# Patient Record
Sex: Male | Born: 1970 | Race: White | Hispanic: No | Marital: Married | Smoking: Current every day smoker
Health system: Southern US, Community
[De-identification: ages and names within clinical notes are randomized; demographics above are authoritative.]

## PROBLEM LIST (undated history)

## (undated) DIAGNOSIS — E1169 Type 2 diabetes mellitus with other specified complication: Secondary | ICD-10-CM

## (undated) DIAGNOSIS — I10 Essential (primary) hypertension: Secondary | ICD-10-CM

## (undated) DIAGNOSIS — E785 Hyperlipidemia, unspecified: Secondary | ICD-10-CM

## (undated) DIAGNOSIS — K429 Umbilical hernia without obstruction or gangrene: Secondary | ICD-10-CM

## (undated) DIAGNOSIS — R519 Headache, unspecified: Secondary | ICD-10-CM

## (undated) DIAGNOSIS — G8929 Other chronic pain: Secondary | ICD-10-CM

## (undated) DIAGNOSIS — I2510 Atherosclerotic heart disease of native coronary artery without angina pectoris: Secondary | ICD-10-CM

## (undated) DIAGNOSIS — R51 Headache: Secondary | ICD-10-CM

## (undated) DIAGNOSIS — Z720 Tobacco use: Secondary | ICD-10-CM

## (undated) DIAGNOSIS — N049 Nephrotic syndrome with unspecified morphologic changes: Secondary | ICD-10-CM

## (undated) DIAGNOSIS — L0390 Cellulitis, unspecified: Secondary | ICD-10-CM

## (undated) DIAGNOSIS — N289 Disorder of kidney and ureter, unspecified: Secondary | ICD-10-CM

## (undated) HISTORY — PX: CORONARY STENT PLACEMENT: SHX1402

---

## 2001-03-17 ENCOUNTER — Encounter: Payer: Self-pay | Admitting: *Deleted

## 2001-03-17 ENCOUNTER — Emergency Department (HOSPITAL_COMMUNITY): Admission: EM | Admit: 2001-03-17 | Discharge: 2001-03-17 | Payer: Self-pay | Admitting: *Deleted

## 2002-09-14 ENCOUNTER — Emergency Department (HOSPITAL_COMMUNITY): Admission: EM | Admit: 2002-09-14 | Discharge: 2002-09-15 | Payer: Self-pay | Admitting: *Deleted

## 2002-09-15 ENCOUNTER — Encounter: Payer: Self-pay | Admitting: *Deleted

## 2002-09-15 ENCOUNTER — Encounter: Payer: Self-pay | Admitting: Family Medicine

## 2002-09-19 ENCOUNTER — Ambulatory Visit (HOSPITAL_COMMUNITY): Admission: RE | Admit: 2002-09-19 | Discharge: 2002-09-19 | Payer: Self-pay | Admitting: Urology

## 2002-09-19 ENCOUNTER — Encounter: Payer: Self-pay | Admitting: Urology

## 2003-03-15 ENCOUNTER — Emergency Department (HOSPITAL_COMMUNITY): Admission: EM | Admit: 2003-03-15 | Discharge: 2003-03-15 | Payer: Self-pay | Admitting: Emergency Medicine

## 2003-04-11 ENCOUNTER — Emergency Department (HOSPITAL_COMMUNITY): Admission: EM | Admit: 2003-04-11 | Discharge: 2003-04-11 | Payer: Self-pay | Admitting: *Deleted

## 2003-04-11 ENCOUNTER — Encounter: Payer: Self-pay | Admitting: *Deleted

## 2003-05-13 ENCOUNTER — Emergency Department (HOSPITAL_COMMUNITY): Admission: EM | Admit: 2003-05-13 | Discharge: 2003-05-13 | Payer: Self-pay | Admitting: *Deleted

## 2003-05-13 ENCOUNTER — Encounter: Payer: Self-pay | Admitting: *Deleted

## 2004-01-13 ENCOUNTER — Encounter (HOSPITAL_COMMUNITY): Admission: AD | Admit: 2004-01-13 | Discharge: 2004-02-12 | Payer: Self-pay | Admitting: Internal Medicine

## 2004-04-26 ENCOUNTER — Emergency Department (HOSPITAL_COMMUNITY): Admission: EM | Admit: 2004-04-26 | Discharge: 2004-04-26 | Payer: Self-pay | Admitting: Emergency Medicine

## 2004-04-29 ENCOUNTER — Emergency Department (HOSPITAL_COMMUNITY): Admission: EM | Admit: 2004-04-29 | Discharge: 2004-04-29 | Payer: Self-pay | Admitting: Emergency Medicine

## 2004-05-31 ENCOUNTER — Emergency Department (HOSPITAL_COMMUNITY): Admission: EM | Admit: 2004-05-31 | Discharge: 2004-05-31 | Payer: Self-pay | Admitting: Emergency Medicine

## 2004-06-20 ENCOUNTER — Emergency Department (HOSPITAL_COMMUNITY): Admission: EM | Admit: 2004-06-20 | Discharge: 2004-06-20 | Payer: Self-pay | Admitting: Emergency Medicine

## 2004-07-12 ENCOUNTER — Emergency Department (HOSPITAL_COMMUNITY): Admission: EM | Admit: 2004-07-12 | Discharge: 2004-07-12 | Payer: Self-pay | Admitting: Emergency Medicine

## 2004-07-16 ENCOUNTER — Emergency Department (HOSPITAL_COMMUNITY): Admission: EM | Admit: 2004-07-16 | Discharge: 2004-07-16 | Payer: Self-pay | Admitting: *Deleted

## 2004-07-17 ENCOUNTER — Emergency Department (HOSPITAL_COMMUNITY): Admission: EM | Admit: 2004-07-17 | Discharge: 2004-07-17 | Payer: Self-pay | Admitting: Emergency Medicine

## 2004-08-05 ENCOUNTER — Emergency Department (HOSPITAL_COMMUNITY): Admission: EM | Admit: 2004-08-05 | Discharge: 2004-08-05 | Payer: Self-pay | Admitting: Emergency Medicine

## 2004-08-13 ENCOUNTER — Emergency Department (HOSPITAL_COMMUNITY): Admission: EM | Admit: 2004-08-13 | Discharge: 2004-08-13 | Payer: Self-pay | Admitting: Emergency Medicine

## 2004-11-19 ENCOUNTER — Emergency Department (HOSPITAL_COMMUNITY): Admission: EM | Admit: 2004-11-19 | Discharge: 2004-11-19 | Payer: Self-pay | Admitting: Emergency Medicine

## 2004-11-26 ENCOUNTER — Emergency Department (HOSPITAL_COMMUNITY): Admission: EM | Admit: 2004-11-26 | Discharge: 2004-11-26 | Payer: Self-pay | Admitting: Emergency Medicine

## 2004-12-26 ENCOUNTER — Emergency Department (HOSPITAL_COMMUNITY): Admission: EM | Admit: 2004-12-26 | Discharge: 2004-12-26 | Payer: Self-pay | Admitting: Emergency Medicine

## 2004-12-30 ENCOUNTER — Emergency Department (HOSPITAL_COMMUNITY): Admission: EM | Admit: 2004-12-30 | Discharge: 2004-12-30 | Payer: Self-pay | Admitting: Emergency Medicine

## 2005-01-22 ENCOUNTER — Emergency Department (HOSPITAL_COMMUNITY): Admission: EM | Admit: 2005-01-22 | Discharge: 2005-01-22 | Payer: Self-pay | Admitting: Emergency Medicine

## 2005-02-05 ENCOUNTER — Emergency Department (HOSPITAL_COMMUNITY): Admission: EM | Admit: 2005-02-05 | Discharge: 2005-02-05 | Payer: Self-pay | Admitting: Emergency Medicine

## 2005-02-10 ENCOUNTER — Emergency Department (HOSPITAL_COMMUNITY): Admission: EM | Admit: 2005-02-10 | Discharge: 2005-02-10 | Payer: Self-pay | Admitting: Emergency Medicine

## 2005-02-18 ENCOUNTER — Emergency Department (HOSPITAL_COMMUNITY): Admission: EM | Admit: 2005-02-18 | Discharge: 2005-02-18 | Payer: Self-pay | Admitting: Emergency Medicine

## 2005-03-04 ENCOUNTER — Emergency Department (HOSPITAL_COMMUNITY): Admission: EM | Admit: 2005-03-04 | Discharge: 2005-03-04 | Payer: Self-pay | Admitting: Emergency Medicine

## 2005-03-18 ENCOUNTER — Encounter: Payer: Self-pay | Admitting: Orthopedic Surgery

## 2005-03-18 ENCOUNTER — Emergency Department (HOSPITAL_COMMUNITY): Admission: EM | Admit: 2005-03-18 | Discharge: 2005-03-18 | Payer: Self-pay | Admitting: *Deleted

## 2005-04-25 ENCOUNTER — Emergency Department (HOSPITAL_COMMUNITY): Admission: EM | Admit: 2005-04-25 | Discharge: 2005-04-25 | Payer: Self-pay | Admitting: Emergency Medicine

## 2005-04-28 ENCOUNTER — Emergency Department (HOSPITAL_COMMUNITY): Admission: EM | Admit: 2005-04-28 | Discharge: 2005-04-28 | Payer: Self-pay | Admitting: Emergency Medicine

## 2006-02-02 ENCOUNTER — Emergency Department (HOSPITAL_COMMUNITY): Admission: EM | Admit: 2006-02-02 | Discharge: 2006-02-02 | Payer: Self-pay | Admitting: Emergency Medicine

## 2006-02-17 ENCOUNTER — Emergency Department (HOSPITAL_COMMUNITY): Admission: EM | Admit: 2006-02-17 | Discharge: 2006-02-17 | Payer: Self-pay | Admitting: Emergency Medicine

## 2006-05-03 ENCOUNTER — Emergency Department (HOSPITAL_COMMUNITY): Admission: EM | Admit: 2006-05-03 | Discharge: 2006-05-03 | Payer: Self-pay | Admitting: Emergency Medicine

## 2007-02-08 ENCOUNTER — Emergency Department (HOSPITAL_COMMUNITY): Admission: EM | Admit: 2007-02-08 | Discharge: 2007-02-08 | Payer: Self-pay | Admitting: Emergency Medicine

## 2007-07-02 ENCOUNTER — Emergency Department (HOSPITAL_COMMUNITY): Admission: EM | Admit: 2007-07-02 | Discharge: 2007-07-02 | Payer: Self-pay | Admitting: Emergency Medicine

## 2007-07-15 ENCOUNTER — Emergency Department (HOSPITAL_COMMUNITY): Admission: EM | Admit: 2007-07-15 | Discharge: 2007-07-15 | Payer: Self-pay | Admitting: Emergency Medicine

## 2008-09-06 ENCOUNTER — Emergency Department (HOSPITAL_COMMUNITY): Admission: EM | Admit: 2008-09-06 | Discharge: 2008-09-06 | Payer: Self-pay | Admitting: Emergency Medicine

## 2008-10-10 ENCOUNTER — Emergency Department (HOSPITAL_COMMUNITY): Admission: EM | Admit: 2008-10-10 | Discharge: 2008-10-10 | Payer: Self-pay | Admitting: Emergency Medicine

## 2008-10-13 ENCOUNTER — Emergency Department (HOSPITAL_COMMUNITY): Admission: EM | Admit: 2008-10-13 | Discharge: 2008-10-13 | Payer: Self-pay | Admitting: Emergency Medicine

## 2008-10-28 ENCOUNTER — Encounter: Payer: Self-pay | Admitting: Orthopedic Surgery

## 2008-10-30 ENCOUNTER — Ambulatory Visit: Payer: Self-pay | Admitting: Orthopedic Surgery

## 2008-10-30 DIAGNOSIS — M549 Dorsalgia, unspecified: Secondary | ICD-10-CM | POA: Insufficient documentation

## 2008-10-30 DIAGNOSIS — M5430 Sciatica, unspecified side: Secondary | ICD-10-CM | POA: Insufficient documentation

## 2008-11-06 ENCOUNTER — Emergency Department (HOSPITAL_COMMUNITY): Admission: EM | Admit: 2008-11-06 | Discharge: 2008-11-06 | Payer: Self-pay | Admitting: Emergency Medicine

## 2008-11-06 ENCOUNTER — Telehealth: Payer: Self-pay | Admitting: Orthopedic Surgery

## 2008-11-13 ENCOUNTER — Telehealth: Payer: Self-pay | Admitting: Orthopedic Surgery

## 2009-01-23 ENCOUNTER — Emergency Department (HOSPITAL_COMMUNITY): Admission: EM | Admit: 2009-01-23 | Discharge: 2009-01-23 | Payer: Self-pay | Admitting: Emergency Medicine

## 2009-03-12 ENCOUNTER — Emergency Department (HOSPITAL_COMMUNITY): Admission: EM | Admit: 2009-03-12 | Discharge: 2009-03-12 | Payer: Self-pay | Admitting: Emergency Medicine

## 2009-03-20 ENCOUNTER — Emergency Department (HOSPITAL_COMMUNITY): Admission: EM | Admit: 2009-03-20 | Discharge: 2009-03-20 | Payer: Self-pay | Admitting: Emergency Medicine

## 2009-06-24 ENCOUNTER — Emergency Department (HOSPITAL_COMMUNITY): Admission: EM | Admit: 2009-06-24 | Discharge: 2009-06-24 | Payer: Self-pay | Admitting: Emergency Medicine

## 2009-07-04 ENCOUNTER — Emergency Department (HOSPITAL_COMMUNITY): Admission: EM | Admit: 2009-07-04 | Discharge: 2009-07-04 | Payer: Self-pay | Admitting: Emergency Medicine

## 2009-07-06 ENCOUNTER — Emergency Department (HOSPITAL_COMMUNITY): Admission: EM | Admit: 2009-07-06 | Discharge: 2009-07-06 | Payer: Self-pay | Admitting: Emergency Medicine

## 2009-08-19 ENCOUNTER — Emergency Department (HOSPITAL_COMMUNITY): Admission: EM | Admit: 2009-08-19 | Discharge: 2009-08-19 | Payer: Self-pay | Admitting: Emergency Medicine

## 2009-10-27 ENCOUNTER — Emergency Department (HOSPITAL_COMMUNITY): Admission: EM | Admit: 2009-10-27 | Discharge: 2009-10-27 | Payer: Self-pay | Admitting: Emergency Medicine

## 2009-12-26 ENCOUNTER — Emergency Department (HOSPITAL_COMMUNITY): Admission: EM | Admit: 2009-12-26 | Discharge: 2009-12-26 | Payer: Self-pay | Admitting: Emergency Medicine

## 2010-02-04 ENCOUNTER — Emergency Department (HOSPITAL_COMMUNITY): Admission: EM | Admit: 2010-02-04 | Discharge: 2010-02-05 | Payer: Self-pay | Admitting: Emergency Medicine

## 2010-02-20 ENCOUNTER — Ambulatory Visit: Payer: Self-pay | Admitting: Cardiology

## 2010-02-20 ENCOUNTER — Inpatient Hospital Stay (HOSPITAL_COMMUNITY): Admission: EM | Admit: 2010-02-20 | Discharge: 2010-02-27 | Payer: Self-pay | Admitting: Emergency Medicine

## 2010-02-21 ENCOUNTER — Encounter (INDEPENDENT_AMBULATORY_CARE_PROVIDER_SITE_OTHER): Payer: Self-pay | Admitting: Internal Medicine

## 2010-06-05 ENCOUNTER — Ambulatory Visit (HOSPITAL_COMMUNITY): Admission: RE | Admit: 2010-06-05 | Discharge: 2010-06-05 | Payer: Self-pay | Admitting: Family Medicine

## 2010-10-15 ENCOUNTER — Ambulatory Visit (HOSPITAL_COMMUNITY): Admission: RE | Admit: 2010-10-15 | Discharge: 2010-10-15 | Payer: Self-pay | Admitting: Family Medicine

## 2010-12-22 ENCOUNTER — Encounter: Payer: Self-pay | Admitting: *Deleted

## 2010-12-22 ENCOUNTER — Encounter: Payer: Self-pay | Admitting: Preventative Medicine

## 2010-12-22 ENCOUNTER — Encounter: Payer: Self-pay | Admitting: Family Medicine

## 2010-12-31 NOTE — Progress Notes (Signed)
Summary: call rec'd from pt's fiancee re:pt's back pain  Phone Note Other Incoming   Summary of Call: Wayne Spencer, fiancee, called asking to speak with nurse regarding Wayne Spencer and the continuing back pain.  Advised that must speak to patient directly.  Colette states she understands but that "pt is at his brother's and will not have access to a phone" and  "would really like to talk with nurse for a few minutes" Colette's ph [phone] Initial call taken by: Ihor Austin,  November 13, 2008 9:36 AM

## 2010-12-31 NOTE — Assessment & Plan Note (Signed)
Summary: [facility] ER F/UP/BACK PAIN/NO FILMS/NO HX SURG/SELFPAY/CAF    History of Present Illness: I saw Wayne Spencer in the office today for an initial visit.  He is a 40 years old man with the complaint of:  low back pain.  Patient states that a year ago, he picked up a dog house and he had instant pain.   He says that he has pain across his lower back and down his left leg to his knee, he has some numbness,  this morning, he had numbness down to his foot. He has morepain when he sits for a long period of time.  Patient went to the ER on 10-10-08 and they gave him Tylox and he has to take 2 for relief. He has finished the medicine.  Xrays at [facility], negative.      Prior Medication List:  No prior medications documented  Current Allergies (reviewed today): ! * TRAMADOL  Past Medical History:    Reviewed history and no changes required:       Back problems       Headaches  Past Surgical History:    none   Family History:    Family History of Diabetes        cardiac    arthritis  Social History:    Patient is separated.    Risk Factors:  Tobacco use:  current   Review of Systems  General      Complains of weight loss and weight gain.  Cardiac      Complains of chest pain and poor circulation.  Resp      Complains of short of breath, difficulty breathing, and cough.  GI      Complains of reflux.  GU      Complains of kidney stones.  Neuro      Complains of headache, migraines, and unsteady walking.  MS      Complains of joint pain and joint swelling.  Endo      Denies thyroid disease, goiter, and diabetes.  Psych      Complains of depression and mood swings.  Derm      Complains of itching.  EENT      Complains of poor vision, vertigo, ears ringing, sinusitis, and toothaches.  Immunology      Complains of sinus problems.  Lymphatic      Denies lymph node cancer and lymph edema.   Physical Exam  Skin:     intact without lesions  or rashes Cervical Nodes:     no significant adenopathy Psych:     alert and cooperative; normal mood and affect; normal attention span and concentration   Detailed Back/Spine Exam  General:    Well-developed, well-nourished, normal body habitus; no deformities, normal grooming.    Gait:    Normal heel-toe gait pattern bilaterally.    Vascular:    dorsalis pedis and posterior tibial pulses 2+ and symmetric, capillary refill < 2 seconds, normal hair pattern, no evidence of ischemia.   Lumbosacral Exam:  Inspection-deformity:    Normal Palpation-spinal tenderness:  Abnormal    Location:  L4-L5 Range of Motion:    Forward Flexion:   40 degrees    Hyperextension:   25 degrees    Right Lateral Bend:   15 degrees    Left Lateral Bend:   15 degrees Lying Straight Leg Raise:    Right:  negative    Left:  positive at 10 degrees Sitting Straight Leg Raise:  Right:  positive in back only    Left:  positive in back only Reverse Straight Leg Raise:    Right:  negative    Left:  negative Contralateral Straight Leg Raise:    Right:  negative    Left:  negative Sciatic Notch:    There is no sciatic notch tenderness. Toe Walking:    Right:  normal    Left:  normal Heel Walking:    Right:  normal    Left:  normal    Impression & Recommendations:  Problem # 1:  BACK PAIN (ICD-724.5) Assessment: New Data: L spine X-rays show no degenerative changes.  Recommend MRI of L spine if [facility] 100% discount   In the mean time take steroids and neurontin   Orders: New Patient Level III [id number])   Problem # 2:  SCIATICA (ICD-724.3) Assessment: New LEFT SCIATICA is intermittent   Orders: New Patient Level III [id number])   Medications Added to Medication List This Visit: 1)  Sterapred Ds 12 Day 10 Mg Tabs (Prednisone) .... As directed 2)  Neurontin 100 Mg Caps (Gabapentin) .Marland Kitchen.. 1 by mouth three times a day as needed when having numbness   Patient Instructions: 1)  no f/u  scheduled until MRI scheduled    Prescriptions: NEURONTIN 100 MG CAPS (GABAPENTIN) 1 by mouth three times a day as needed when having numbness  #90 x 1   Entered and Authorized by:   Arther Abbott MD   Signed by:   Arther Abbott MD on 10/30/2008   Method used:   Print then Give to Patient   RxID:   [id number] 12 DAY 10 MG TABS (PREDNISONE) as directed  #1 x 1   Entered and Authorized by:   Arther Abbott MD   Signed by:   Arther Abbott MD on 10/30/2008   Method used:   Print then Give to Patient   [id number]  ]

## 2010-12-31 NOTE — Progress Notes (Signed)
Summary: neurontin not helping  Phone Note Call from Patient   Summary of Call: Keyo Opalewski (2071/01/04) says the Neurontin is not helping his back pain.  Has 3 more days of predisone to take.  What else can you do for the pain?  [id number] Initial call taken by: Ruffin Pyo,  November 06, 2008 9:09 AM  Follow-up for Phone Call        nothing at this time    Follow-up by: Arther Abbott MD,  November 06, 2008 9:19 AM  Additional Follow-up for Phone Call Additional follow up Details #1::        Relayed your answer, he said he would just go to the hospital to get something for pain. Additional Follow-up by: Ruffin Pyo,  November 06, 2008 9:41 AM

## 2011-02-21 LAB — RENAL FUNCTION PANEL
Albumin: 1.4 g/dL — ABNORMAL LOW (ref 3.5–5.2)
BUN: 6 mg/dL (ref 6–23)
CO2: 28 mEq/L (ref 19–32)
Calcium: 8.2 mg/dL — ABNORMAL LOW (ref 8.4–10.5)
Chloride: 107 mEq/L (ref 96–112)
Creatinine, Ser: 0.9 mg/dL (ref 0.4–1.5)
GFR calc Af Amer: >60 mL/min (ref >60)
GFR calc non Af Amer: >60 mL/min (ref >60)
Glucose, Bld: 88 mg/dL (ref 70–99)
Phosphorus: 4.6 mg/dL (ref 2.3–4.6)
Potassium: 3.3 mEq/L — ABNORMAL LOW (ref 3.5–5.1)
Sodium: 138 mEq/L (ref 135–145)

## 2011-02-21 LAB — CBC
HCT: 34.5 % — ABNORMAL LOW (ref 39.0–52.0)
HCT: 35.3 % — ABNORMAL LOW (ref 39.0–52.0)
HCT: 35.5 % — ABNORMAL LOW (ref 39.0–52.0)
HCT: 35.6 % — ABNORMAL LOW (ref 39.0–52.0)
HCT: 35.7 % — ABNORMAL LOW (ref 39.0–52.0)
HCT: 35.7 % — ABNORMAL LOW (ref 39.0–52.0)
HCT: 37 % — ABNORMAL LOW (ref 39.0–52.0)
HCT: 38.5 % — ABNORMAL LOW (ref 39.0–52.0)
HCT: 38.8 % — ABNORMAL LOW (ref 39.0–52.0)
Hemoglobin: 12.4 g/dL — ABNORMAL LOW (ref 13.0–17.0)
Hemoglobin: 12.6 g/dL — ABNORMAL LOW (ref 13.0–17.0)
Hemoglobin: 12.7 g/dL — ABNORMAL LOW (ref 13.0–17.0)
Hemoglobin: 12.7 g/dL — ABNORMAL LOW (ref 13.0–17.0)
Hemoglobin: 12.8 g/dL — ABNORMAL LOW (ref 13.0–17.0)
Hemoglobin: 12.9 g/dL — ABNORMAL LOW (ref 13.0–17.0)
Hemoglobin: 13 g/dL (ref 13.0–17.0)
Hemoglobin: 13.8 g/dL (ref 13.0–17.0)
Hemoglobin: 13.8 g/dL (ref 13.0–17.0)
MCHC: 35.1 g/dL (ref 30.0–36.0)
MCHC: 35.2 g/dL (ref 30.0–36.0)
MCHC: 35.6 g/dL (ref 30.0–36.0)
MCHC: 35.7 g/dL (ref 30.0–36.0)
MCHC: 35.8 g/dL (ref 30.0–36.0)
MCHC: 35.8 g/dL (ref 30.0–36.0)
MCHC: 35.9 g/dL (ref 30.0–36.0)
MCHC: 36.1 g/dL — ABNORMAL HIGH (ref 30.0–36.0)
MCHC: 36.2 g/dL — ABNORMAL HIGH (ref 30.0–36.0)
MCV: 88.8 fL (ref 78.0–100.0)
MCV: 89.8 fL (ref 78.0–100.0)
MCV: 89.9 fL (ref 78.0–100.0)
MCV: 90.1 fL (ref 78.0–100.0)
MCV: 90.1 fL (ref 78.0–100.0)
MCV: 90.4 fL (ref 78.0–100.0)
MCV: 90.8 fL (ref 78.0–100.0)
MCV: 91 fL (ref 78.0–100.0)
MCV: 92.4 fL (ref 78.0–100.0)
Platelets: 107 10*3/uL — ABNORMAL LOW (ref 150–400)
Platelets: 120 10*3/uL — ABNORMAL LOW (ref 150–400)
Platelets: 129 10*3/uL — ABNORMAL LOW (ref 150–400)
Platelets: 142 10*3/uL — ABNORMAL LOW (ref 150–400)
Platelets: 151 10*3/uL (ref 150–400)
Platelets: 152 10*3/uL (ref 150–400)
Platelets: 162 10*3/uL (ref 150–400)
Platelets: 171 10*3/uL (ref 150–400)
Platelets: 178 10*3/uL (ref 150–400)
RBC: 3.79 MIL/uL — ABNORMAL LOW (ref 4.22–5.81)
RBC: 3.93 MIL/uL — ABNORMAL LOW (ref 4.22–5.81)
RBC: 3.93 MIL/uL — ABNORMAL LOW (ref 4.22–5.81)
RBC: 3.94 MIL/uL — ABNORMAL LOW (ref 4.22–5.81)
RBC: 3.96 MIL/uL — ABNORMAL LOW (ref 4.22–5.81)
RBC: 4.01 MIL/uL — ABNORMAL LOW (ref 4.22–5.81)
RBC: 4.02 MIL/uL — ABNORMAL LOW (ref 4.22–5.81)
RBC: 4.27 MIL/uL (ref 4.22–5.81)
RBC: 4.32 MIL/uL (ref 4.22–5.81)
RDW: 13.5 % (ref 11.5–15.5)
RDW: 13.8 % (ref 11.5–15.5)
RDW: 13.9 % (ref 11.5–15.5)
RDW: 13.9 % (ref 11.5–15.5)
RDW: 13.9 % (ref 11.5–15.5)
RDW: 13.9 % (ref 11.5–15.5)
RDW: 14.2 % (ref 11.5–15.5)
RDW: 14.2 % (ref 11.5–15.5)
RDW: 14.4 % (ref 11.5–15.5)
WBC: 12.9 10*3/uL — ABNORMAL HIGH (ref 4.0–10.5)
WBC: 14.7 10*3/uL — ABNORMAL HIGH (ref 4.0–10.5)
WBC: 6.5 10*3/uL (ref 4.0–10.5)
WBC: 6.5 10*3/uL (ref 4.0–10.5)
WBC: 6.7 10*3/uL (ref 4.0–10.5)
WBC: 7.2 10*3/uL (ref 4.0–10.5)
WBC: 7.3 10*3/uL (ref 4.0–10.5)
WBC: 9.1 10*3/uL (ref 4.0–10.5)
WBC: 9.8 10*3/uL (ref 4.0–10.5)

## 2011-02-21 LAB — DIFFERENTIAL
Basophils Absolute: 0 10*3/uL (ref 0.0–0.1)
Basophils Absolute: 0 10*3/uL (ref 0.0–0.1)
Basophils Absolute: 0 10*3/uL (ref 0.0–0.1)
Basophils Absolute: 0 10*3/uL (ref 0.0–0.1)
Basophils Absolute: 0 10*3/uL (ref 0.0–0.1)
Basophils Absolute: 0 10*3/uL (ref 0.0–0.1)
Basophils Absolute: 0 10*3/uL (ref 0.0–0.1)
Basophils Absolute: 0.1 10*3/uL (ref 0.0–0.1)
Basophils Absolute: 0.1 10*3/uL (ref 0.0–0.1)
Basophils Relative: 0 % (ref 0–1)
Basophils Relative: 0 % (ref 0–1)
Basophils Relative: 0 % (ref 0–1)
Basophils Relative: 0 % (ref 0–1)
Basophils Relative: 1 % (ref 0–1)
Basophils Relative: 1 % (ref 0–1)
Basophils Relative: 1 % (ref 0–1)
Basophils Relative: 1 % (ref 0–1)
Basophils Relative: 1 % (ref 0–1)
Eosinophils Absolute: 0 10*3/uL (ref 0.0–0.7)
Eosinophils Absolute: 0.1 10*3/uL (ref 0.0–0.7)
Eosinophils Absolute: 0.2 10*3/uL (ref 0.0–0.7)
Eosinophils Absolute: 0.3 10*3/uL (ref 0.0–0.7)
Eosinophils Absolute: 0.3 10*3/uL (ref 0.0–0.7)
Eosinophils Absolute: 0.3 10*3/uL (ref 0.0–0.7)
Eosinophils Absolute: 0.3 10*3/uL (ref 0.0–0.7)
Eosinophils Absolute: 0.3 10*3/uL (ref 0.0–0.7)
Eosinophils Absolute: 0.3 10*3/uL (ref 0.0–0.7)
Eosinophils Relative: 0 % (ref 0–5)
Eosinophils Relative: 1 % (ref 0–5)
Eosinophils Relative: 3 % (ref 0–5)
Eosinophils Relative: 3 % (ref 0–5)
Eosinophils Relative: 3 % (ref 0–5)
Eosinophils Relative: 4 % (ref 0–5)
Eosinophils Relative: 4 % (ref 0–5)
Eosinophils Relative: 5 % (ref 0–5)
Eosinophils Relative: 5 % (ref 0–5)
Lymphocytes Relative: 11 % — ABNORMAL LOW (ref 12–46)
Lymphocytes Relative: 23 % (ref 12–46)
Lymphocytes Relative: 24 % (ref 12–46)
Lymphocytes Relative: 29 % (ref 12–46)
Lymphocytes Relative: 32 % (ref 12–46)
Lymphocytes Relative: 36 % (ref 12–46)
Lymphocytes Relative: 37 % (ref 12–46)
Lymphocytes Relative: 37 % (ref 12–46)
Lymphocytes Relative: 8 % — ABNORMAL LOW (ref 12–46)
Lymphs Abs: 1.2 10*3/uL (ref 0.7–4.0)
Lymphs Abs: 1.4 10*3/uL (ref 0.7–4.0)
Lymphs Abs: 1.6 10*3/uL (ref 0.7–4.0)
Lymphs Abs: 2.3 10*3/uL (ref 0.7–4.0)
Lymphs Abs: 2.3 10*3/uL (ref 0.7–4.0)
Lymphs Abs: 2.4 10*3/uL (ref 0.7–4.0)
Lymphs Abs: 2.4 10*3/uL (ref 0.7–4.0)
Lymphs Abs: 2.4 10*3/uL (ref 0.7–4.0)
Lymphs Abs: 2.7 10*3/uL (ref 0.7–4.0)
Monocytes Absolute: 0.4 10*3/uL (ref 0.1–1.0)
Monocytes Absolute: 0.6 10*3/uL (ref 0.1–1.0)
Monocytes Absolute: 0.6 10*3/uL (ref 0.1–1.0)
Monocytes Absolute: 0.7 10*3/uL (ref 0.1–1.0)
Monocytes Absolute: 0.7 10*3/uL (ref 0.1–1.0)
Monocytes Absolute: 0.8 10*3/uL (ref 0.1–1.0)
Monocytes Absolute: 0.8 10*3/uL (ref 0.1–1.0)
Monocytes Absolute: 0.8 10*3/uL (ref 0.1–1.0)
Monocytes Absolute: 1.1 10*3/uL — ABNORMAL HIGH (ref 0.1–1.0)
Monocytes Relative: 10 % (ref 3–12)
Monocytes Relative: 10 % (ref 3–12)
Monocytes Relative: 10 % (ref 3–12)
Monocytes Relative: 11 % (ref 3–12)
Monocytes Relative: 12 % (ref 3–12)
Monocytes Relative: 3 % (ref 3–12)
Monocytes Relative: 6 % (ref 3–12)
Monocytes Relative: 9 % (ref 3–12)
Monocytes Relative: 9 % (ref 3–12)
Neutro Abs: 11.1 10*3/uL — ABNORMAL HIGH (ref 1.7–7.7)
Neutro Abs: 12.5 10*3/uL — ABNORMAL HIGH (ref 1.7–7.7)
Neutro Abs: 3.1 10*3/uL (ref 1.7–7.7)
Neutro Abs: 3.1 10*3/uL (ref 1.7–7.7)
Neutro Abs: 3.2 10*3/uL (ref 1.7–7.7)
Neutro Abs: 3.8 10*3/uL (ref 1.7–7.7)
Neutro Abs: 4.6 10*3/uL (ref 1.7–7.7)
Neutro Abs: 5.2 10*3/uL (ref 1.7–7.7)
Neutro Abs: 6 10*3/uL (ref 1.7–7.7)
Neutrophils Relative %: 48 % (ref 43–77)
Neutrophils Relative %: 49 % (ref 43–77)
Neutrophils Relative %: 49 % (ref 43–77)
Neutrophils Relative %: 53 % (ref 43–77)
Neutrophils Relative %: 58 % (ref 43–77)
Neutrophils Relative %: 62 % (ref 43–77)
Neutrophils Relative %: 64 % (ref 43–77)
Neutrophils Relative %: 85 % — ABNORMAL HIGH (ref 43–77)
Neutrophils Relative %: 86 % — ABNORMAL HIGH (ref 43–77)

## 2011-02-21 LAB — PHOSPHORUS: Phosphorus: 5 mg/dL — ABNORMAL HIGH (ref 2.3–4.6)

## 2011-02-21 LAB — BASIC METABOLIC PANEL
BUN: 2 mg/dL — ABNORMAL LOW (ref 6–23)
BUN: 5 mg/dL — ABNORMAL LOW (ref 6–23)
BUN: 5 mg/dL — ABNORMAL LOW (ref 6–23)
BUN: 6 mg/dL (ref 6–23)
BUN: 7 mg/dL (ref 6–23)
BUN: 7 mg/dL (ref 6–23)
BUN: 8 mg/dL (ref 6–23)
CO2: 25 mEq/L (ref 19–32)
CO2: 25 mEq/L (ref 19–32)
CO2: 26 mEq/L (ref 19–32)
CO2: 27 mEq/L (ref 19–32)
CO2: 28 mEq/L (ref 19–32)
CO2: 29 mEq/L (ref 19–32)
CO2: 29 mEq/L (ref 19–32)
Calcium: 7.6 mg/dL — ABNORMAL LOW (ref 8.4–10.5)
Calcium: 7.6 mg/dL — ABNORMAL LOW (ref 8.4–10.5)
Calcium: 7.7 mg/dL — ABNORMAL LOW (ref 8.4–10.5)
Calcium: 7.7 mg/dL — ABNORMAL LOW (ref 8.4–10.5)
Calcium: 7.7 mg/dL — ABNORMAL LOW (ref 8.4–10.5)
Calcium: 7.8 mg/dL — ABNORMAL LOW (ref 8.4–10.5)
Calcium: 8.1 mg/dL — ABNORMAL LOW (ref 8.4–10.5)
Chloride: 105 mEq/L (ref 96–112)
Chloride: 106 mEq/L (ref 96–112)
Chloride: 106 mEq/L (ref 96–112)
Chloride: 107 mEq/L (ref 96–112)
Chloride: 108 mEq/L (ref 96–112)
Chloride: 110 mEq/L (ref 96–112)
Chloride: 110 mEq/L (ref 96–112)
Creatinine, Ser: 0.84 mg/dL (ref 0.4–1.5)
Creatinine, Ser: 0.9 mg/dL (ref 0.4–1.5)
Creatinine, Ser: 0.91 mg/dL (ref 0.4–1.5)
Creatinine, Ser: 0.96 mg/dL (ref 0.4–1.5)
Creatinine, Ser: 1.04 mg/dL (ref 0.4–1.5)
Creatinine, Ser: 1.05 mg/dL (ref 0.4–1.5)
Creatinine, Ser: 1.06 mg/dL (ref 0.4–1.5)
GFR calc Af Amer: >60 mL/min (ref >60)
GFR calc Af Amer: >60 mL/min (ref >60)
GFR calc Af Amer: >60 mL/min (ref >60)
GFR calc Af Amer: >60 mL/min (ref >60)
GFR calc Af Amer: >60 mL/min (ref >60)
GFR calc Af Amer: >60 mL/min (ref >60)
GFR calc Af Amer: >60 mL/min (ref >60)
GFR calc non Af Amer: >60 mL/min (ref >60)
GFR calc non Af Amer: >60 mL/min (ref >60)
GFR calc non Af Amer: >60 mL/min (ref >60)
GFR calc non Af Amer: >60 mL/min (ref >60)
GFR calc non Af Amer: >60 mL/min (ref >60)
GFR calc non Af Amer: >60 mL/min (ref >60)
GFR calc non Af Amer: >60 mL/min (ref >60)
Glucose, Bld: 104 mg/dL — ABNORMAL HIGH (ref 70–99)
Glucose, Bld: 105 mg/dL — ABNORMAL HIGH (ref 70–99)
Glucose, Bld: 125 mg/dL — ABNORMAL HIGH (ref 70–99)
Glucose, Bld: 86 mg/dL (ref 70–99)
Glucose, Bld: 95 mg/dL (ref 70–99)
Glucose, Bld: 96 mg/dL (ref 70–99)
Glucose, Bld: 97 mg/dL (ref 70–99)
Potassium: 3.3 mEq/L — ABNORMAL LOW (ref 3.5–5.1)
Potassium: 3.5 mEq/L (ref 3.5–5.1)
Potassium: 3.5 mEq/L (ref 3.5–5.1)
Potassium: 3.5 mEq/L (ref 3.5–5.1)
Potassium: 3.7 mEq/L (ref 3.5–5.1)
Potassium: 3.9 mEq/L (ref 3.5–5.1)
Potassium: 3.9 mEq/L (ref 3.5–5.1)
Sodium: 135 mEq/L (ref 135–145)
Sodium: 135 mEq/L (ref 135–145)
Sodium: 137 mEq/L (ref 135–145)
Sodium: 138 mEq/L (ref 135–145)
Sodium: 140 mEq/L (ref 135–145)
Sodium: 140 mEq/L (ref 135–145)
Sodium: 140 mEq/L (ref 135–145)

## 2011-02-21 LAB — LIPID PANEL
Cholesterol: 389 mg/dL — ABNORMAL HIGH (ref 0–200)
HDL: 23 mg/dL — ABNORMAL LOW (ref >39)
LDL Cholesterol: 329 mg/dL — ABNORMAL HIGH (ref 0–99)
Total CHOL/HDL Ratio: 16.9 RATIO
Triglycerides: 183 mg/dL — ABNORMAL HIGH (ref <150)
VLDL: 37 mg/dL (ref 0–40)

## 2011-02-21 LAB — CULTURE, BLOOD (ROUTINE X 2)
Culture: NO GROWTH
Culture: NO GROWTH
Report Status: 3282011
Report Status: 3282011

## 2011-02-21 LAB — URINALYSIS, ROUTINE W REFLEX MICROSCOPIC
Bilirubin Urine: NEGATIVE
Glucose, UA: NEGATIVE mg/dL
Ketones, ur: NEGATIVE mg/dL
Leukocytes, UA: NEGATIVE
Nitrite: NEGATIVE
Protein, ur: >300 mg/dL — AB
Specific Gravity, Urine: 1.02 (ref 1.005–1.030)
Urobilinogen, UA: 0.2 mg/dL (ref 0.0–1.0)
pH: 6.5 (ref 5.0–8.0)

## 2011-02-21 LAB — ANTI-NEUTROPHIL ANTIBODY

## 2011-02-21 LAB — COMPREHENSIVE METABOLIC PANEL
ALT: 11 U/L (ref 0–53)
AST: 15 U/L (ref 0–37)
Albumin: 1.4 g/dL — ABNORMAL LOW (ref 3.5–5.2)
Alkaline Phosphatase: 42 U/L (ref 39–117)
BUN: 7 mg/dL (ref 6–23)
CO2: 24 mEq/L (ref 19–32)
Calcium: 7.3 mg/dL — ABNORMAL LOW (ref 8.4–10.5)
Chloride: 110 mEq/L (ref 96–112)
Creatinine, Ser: 1.03 mg/dL (ref 0.4–1.5)
GFR calc Af Amer: >60 mL/min (ref >60)
GFR calc non Af Amer: >60 mL/min (ref >60)
Glucose, Bld: 112 mg/dL — ABNORMAL HIGH (ref 70–99)
Potassium: 3.7 mEq/L (ref 3.5–5.1)
Sodium: 138 mEq/L (ref 135–145)
Total Bilirubin: 0.8 mg/dL (ref 0.3–1.2)
Total Protein: 3.7 g/dL — ABNORMAL LOW (ref 6.0–8.3)

## 2011-02-21 LAB — PROTEIN, URINE, 24 HOUR
Collection Interval-UPROT: 24 hours
Collection Interval-UPROT: 24 hours
Protein, 24H Urine: 25532 mg/d — ABNORMAL HIGH (ref 50–100)
Protein, 24H Urine: 6164 mg/d — ABNORMAL HIGH (ref 50–100)
Protein, Urine: 134 mg/dL
Protein, Urine: 491 mg/dL
Urine Total Volume-UPROT: 4600 mL
Urine Total Volume-UPROT: 5200 mL

## 2011-02-21 LAB — COMPLEMENT, TOTAL: Compl, Total (CH50): 60 U/mL (ref 31–60)

## 2011-02-21 LAB — BRAIN NATRIURETIC PEPTIDE: Pro B Natriuretic peptide (BNP): <30 pg/mL (ref 0.0–100.0)

## 2011-02-21 LAB — IGG, IGA, IGM
IgA: 159 mg/dL (ref 68–378)
IgG (Immunoglobin G), Serum: 316 mg/dL — ABNORMAL LOW (ref 694–1618)
IgM, Serum: 30 mg/dL — ABNORMAL LOW (ref 60–263)

## 2011-02-21 LAB — POCT CARDIAC MARKERS
CKMB, poc: 5.1 ng/mL (ref 1.0–8.0)
Myoglobin, poc: 159 ng/mL (ref 12–200)
Troponin i, poc: <0.05 ng/mL (ref 0.00–0.09)

## 2011-02-21 LAB — PROTEIN ELECTROPH W RFLX QUANT IMMUNOGLOBULINS
Albumin ELP: 50.2 % — ABNORMAL LOW (ref 55.8–66.1)
Alpha-1-Globulin: 6.9 % — ABNORMAL HIGH (ref 2.9–4.9)
Alpha-2-Globulin: 20.6 % — ABNORMAL HIGH (ref 7.1–11.8)
Beta 2: 7.4 % — ABNORMAL HIGH (ref 3.2–6.5)
Beta Globulin: 6.4 % (ref 4.7–7.2)
Gamma Globulin: 8.5 % — ABNORMAL LOW (ref 11.1–18.8)
M-Spike, %: NOT DETECTED g/dL
Total Protein ELP: 3.8 g/dL — ABNORMAL LOW (ref 6.0–8.3)

## 2011-02-21 LAB — RAPID URINE DRUG SCREEN, HOSP PERFORMED
Amphetamines: NOT DETECTED
Barbiturates: NOT DETECTED
Benzodiazepines: POSITIVE — AB
Cocaine: NOT DETECTED
Opiates: POSITIVE — AB
Tetrahydrocannabinol: NOT DETECTED

## 2011-02-21 LAB — CREATININE, URINE, RANDOM: Creatinine, Urine: 67.75 mg/dL

## 2011-02-21 LAB — MAGNESIUM
Magnesium: 1.4 mg/dL — ABNORMAL LOW (ref 1.5–2.5)
Magnesium: 2 mg/dL (ref 1.5–2.5)

## 2011-02-21 LAB — PROTEIN, URINE, RANDOM: Total Protein, Urine: 708 mg/dL

## 2011-02-21 LAB — PROTIME-INR
INR: 1.12 (ref 0.00–1.49)
Prothrombin Time: 14.3 seconds (ref 11.6–15.2)

## 2011-02-21 LAB — HIV ANTIBODY (ROUTINE TESTING W REFLEX): HIV: NONREACTIVE

## 2011-02-21 LAB — APTT: aPTT: 31 seconds (ref 24–37)

## 2011-02-21 LAB — HEPATITIS B SURFACE ANTIGEN: Hepatitis B Surface Ag: NEGATIVE

## 2011-02-21 LAB — C4 COMPLEMENT: Complement C4, Body Fluid: 22 mg/dL (ref 16–47)

## 2011-02-21 LAB — HEPATITIS C ANTIBODY: HCV Ab: NEGATIVE

## 2011-02-21 LAB — LACTIC ACID, PLASMA: Lactic Acid, Venous: 0.9 mmol/L (ref 0.5–2.2)

## 2011-02-21 LAB — URINE MICROSCOPIC-ADD ON

## 2011-02-21 LAB — RPR: RPR Ser Ql: NONREACTIVE

## 2011-02-21 LAB — IMMUNOFIXATION ADD-ON

## 2011-02-21 LAB — CRYOGLOBULIN

## 2011-02-21 LAB — C3 COMPLEMENT: C3 Complement: 122 mg/dL (ref 88–201)

## 2011-02-21 LAB — TSH: TSH: 3.609 u[IU]/mL (ref 0.350–4.500)

## 2011-02-21 LAB — ANTISTREPTOLYSIN O TITER: ASO: <25 IU/mL (ref 0–116)

## 2011-02-21 LAB — ANA: Anti Nuclear Antibody(ANA): NEGATIVE

## 2011-03-07 LAB — POCT I-STAT, CHEM 8
BUN: 8 mg/dL (ref 6–23)
Calcium, Ion: 1.1 mmol/L — ABNORMAL LOW (ref 1.12–1.32)
Chloride: 106 mEq/L (ref 96–112)
Creatinine, Ser: 1 mg/dL (ref 0.4–1.5)
Glucose, Bld: 95 mg/dL (ref 70–99)
HCT: 39 % (ref 39.0–52.0)
Hemoglobin: 13.3 g/dL (ref 13.0–17.0)
Potassium: 3.7 mEq/L (ref 3.5–5.1)
Sodium: 138 mEq/L (ref 135–145)
TCO2: 23 mmol/L (ref 0–100)

## 2011-03-08 LAB — DIFFERENTIAL
Basophils Absolute: 0 10*3/uL (ref 0.0–0.1)
Basophils Relative: 0 % (ref 0–1)
Eosinophils Absolute: 0.2 10*3/uL (ref 0.0–0.7)
Eosinophils Relative: 3 % (ref 0–5)
Lymphocytes Relative: 29 % (ref 12–46)
Lymphs Abs: 2.1 10*3/uL (ref 0.7–4.0)
Monocytes Absolute: 0.6 10*3/uL (ref 0.1–1.0)
Monocytes Relative: 9 % (ref 3–12)
Neutro Abs: 4.3 10*3/uL (ref 1.7–7.7)
Neutrophils Relative %: 59 % (ref 43–77)

## 2011-03-08 LAB — BASIC METABOLIC PANEL
BUN: 3 mg/dL — ABNORMAL LOW (ref 6–23)
CO2: 29 mEq/L (ref 19–32)
Calcium: 8.5 mg/dL (ref 8.4–10.5)
Chloride: 107 mEq/L (ref 96–112)
Creatinine, Ser: 0.87 mg/dL (ref 0.4–1.5)
GFR calc Af Amer: >60 mL/min (ref >60)
GFR calc non Af Amer: >60 mL/min (ref >60)
Glucose, Bld: 87 mg/dL (ref 70–99)
Potassium: 3.4 mEq/L — ABNORMAL LOW (ref 3.5–5.1)
Sodium: 140 mEq/L (ref 135–145)

## 2011-03-08 LAB — URINE MICROSCOPIC-ADD ON

## 2011-03-08 LAB — CBC
HCT: 40.1 % (ref 39.0–52.0)
Hemoglobin: 14.4 g/dL (ref 13.0–17.0)
MCHC: 35.8 g/dL (ref 30.0–36.0)
MCV: 92.1 fL (ref 78.0–100.0)
Platelets: 151 10*3/uL (ref 150–400)
RBC: 4.35 MIL/uL (ref 4.22–5.81)
RDW: 12.9 % (ref 11.5–15.5)
WBC: 7.3 10*3/uL (ref 4.0–10.5)

## 2011-03-08 LAB — URINALYSIS, ROUTINE W REFLEX MICROSCOPIC
Bilirubin Urine: NEGATIVE
Glucose, UA: NEGATIVE mg/dL
Ketones, ur: NEGATIVE mg/dL
Leukocytes, UA: NEGATIVE
Nitrite: NEGATIVE
Protein, ur: 100 mg/dL — AB
Specific Gravity, Urine: 1.01 (ref 1.005–1.030)
Urobilinogen, UA: 0.2 mg/dL (ref 0.0–1.0)
pH: 6.5 (ref 5.0–8.0)

## 2011-03-09 LAB — POCT CARDIAC MARKERS
CKMB, poc: 1.4 ng/mL (ref 1.0–8.0)
Myoglobin, poc: 70.9 ng/mL (ref 12–200)
Troponin i, poc: <0.05 ng/mL (ref 0.00–0.09)

## 2011-03-12 LAB — POCT CARDIAC MARKERS
CKMB, poc: 1.5 ng/mL (ref 1.0–8.0)
Myoglobin, poc: 97.5 ng/mL (ref 12–200)
Troponin i, poc: <0.05 ng/mL (ref 0.00–0.09)

## 2011-10-30 ENCOUNTER — Other Ambulatory Visit (HOSPITAL_COMMUNITY): Payer: Self-pay | Admitting: Family Medicine

## 2011-10-30 ENCOUNTER — Ambulatory Visit (HOSPITAL_COMMUNITY)
Admission: RE | Admit: 2011-10-30 | Discharge: 2011-10-30 | Disposition: A | Discharge status: Home / Self Care | Payer: Self-pay | Source: Ambulatory Visit | Attending: Family Medicine | Admitting: Family Medicine

## 2011-10-30 DIAGNOSIS — R52 Pain, unspecified: Secondary | ICD-10-CM

## 2011-10-30 DIAGNOSIS — R059 Cough, unspecified: Secondary | ICD-10-CM | POA: Insufficient documentation

## 2011-10-30 DIAGNOSIS — R079 Chest pain, unspecified: Secondary | ICD-10-CM | POA: Insufficient documentation

## 2011-10-30 DIAGNOSIS — R05 Cough: Secondary | ICD-10-CM | POA: Insufficient documentation

## 2013-04-09 ENCOUNTER — Encounter (HOSPITAL_COMMUNITY): Payer: Self-pay | Admitting: *Deleted

## 2013-04-09 ENCOUNTER — Emergency Department (HOSPITAL_COMMUNITY)
Admission: EM | Admit: 2013-04-09 | Discharge: 2013-04-09 | Disposition: A | Discharge status: Home / Self Care | Payer: Self-pay | Attending: Emergency Medicine | Admitting: Emergency Medicine

## 2013-04-09 DIAGNOSIS — L03119 Cellulitis of unspecified part of limb: Secondary | ICD-10-CM | POA: Insufficient documentation

## 2013-04-09 DIAGNOSIS — L02419 Cutaneous abscess of limb, unspecified: Secondary | ICD-10-CM | POA: Insufficient documentation

## 2013-04-09 DIAGNOSIS — Z79899 Other long term (current) drug therapy: Secondary | ICD-10-CM | POA: Insufficient documentation

## 2013-04-09 DIAGNOSIS — IMO0001 Reserved for inherently not codable concepts without codable children: Secondary | ICD-10-CM | POA: Insufficient documentation

## 2013-04-09 DIAGNOSIS — L0390 Cellulitis, unspecified: Secondary | ICD-10-CM

## 2013-04-09 HISTORY — DX: Cellulitis, unspecified: L03.90

## 2013-04-09 LAB — CBC WITH DIFFERENTIAL/PLATELET
Basophils Absolute: 0 10*3/uL (ref 0.0–0.1)
Basophils Relative: 0 % (ref 0–1)
Eosinophils Absolute: 0.2 10*3/uL (ref 0.0–0.7)
Eosinophils Relative: 1 % (ref 0–5)
HCT: 33.8 % — ABNORMAL LOW (ref 39.0–52.0)
Hemoglobin: 12.3 g/dL — ABNORMAL LOW (ref 13.0–17.0)
Lymphocytes Relative: 15 % (ref 12–46)
Lymphs Abs: 1.9 10*3/uL (ref 0.7–4.0)
MCH: 33.5 pg (ref 26.0–34.0)
MCHC: 36.4 g/dL — ABNORMAL HIGH (ref 30.0–36.0)
MCV: 92.1 fL (ref 78.0–100.0)
Monocytes Absolute: 1.4 10*3/uL — ABNORMAL HIGH (ref 0.1–1.0)
Monocytes Relative: 11 % (ref 3–12)
Neutro Abs: 9.3 10*3/uL — ABNORMAL HIGH (ref 1.7–7.7)
Neutrophils Relative %: 73 % (ref 43–77)
Platelets: 170 10*3/uL (ref 150–400)
RBC: 3.67 MIL/uL — ABNORMAL LOW (ref 4.22–5.81)
RDW: 13.4 % (ref 11.5–15.5)
WBC: 12.7 10*3/uL — ABNORMAL HIGH (ref 4.0–10.5)

## 2013-04-09 LAB — BASIC METABOLIC PANEL
BUN: 20 mg/dL (ref 6–23)
CO2: 23 mEq/L (ref 19–32)
Calcium: 8.5 mg/dL (ref 8.4–10.5)
Chloride: 99 mEq/L (ref 96–112)
Creatinine, Ser: 2.63 mg/dL — ABNORMAL HIGH (ref 0.50–1.35)
GFR calc Af Amer: 33 mL/min — ABNORMAL LOW (ref >90)
GFR calc non Af Amer: 29 mL/min — ABNORMAL LOW (ref >90)
Glucose, Bld: 122 mg/dL — ABNORMAL HIGH (ref 70–99)
Potassium: 3.1 mEq/L — ABNORMAL LOW (ref 3.5–5.1)
Sodium: 136 mEq/L (ref 135–145)

## 2013-04-09 MED ORDER — ONDANSETRON 4 MG PO TBDP: 4.0000 mg | ORAL_TABLET | Freq: Three times a day (TID) | ORAL | Status: DC | PRN

## 2013-04-09 MED ORDER — ONDANSETRON HCL 4 MG/2ML IJ SOLN
4.0000 mg | Freq: Once | INTRAMUSCULAR | Status: AC
  Administered 2013-04-09: 4 mg via INTRAVENOUS
  Filled 2013-04-09: qty 2

## 2013-04-09 MED ORDER — DOXYCYCLINE HYCLATE 100 MG PO CAPS: 100.0000 mg | ORAL_CAPSULE | Freq: Two times a day (BID) | ORAL | Status: DC

## 2013-04-09 MED ORDER — VANCOMYCIN HCL IN DEXTROSE 1-5 GM/200ML-% IV SOLN
1000.0000 mg | Freq: Once | INTRAVENOUS | Status: AC
  Administered 2013-04-09: 1000 mg via INTRAVENOUS
  Filled 2013-04-09: qty 200

## 2013-04-09 MED ORDER — POTASSIUM CHLORIDE CRYS ER 20 MEQ PO TBCR
60.0000 meq | EXTENDED_RELEASE_TABLET | Freq: Once | ORAL | Status: AC
  Administered 2013-04-09: 60 meq via ORAL
  Filled 2013-04-09: qty 3

## 2013-04-09 MED ORDER — MORPHINE SULFATE 4 MG/ML IJ SOLN
2.0000 mg | Freq: Once | INTRAMUSCULAR | Status: AC
  Administered 2013-04-09: 2 mg via INTRAVENOUS
  Filled 2013-04-09: qty 1

## 2013-04-09 MED ORDER — SODIUM CHLORIDE 0.9 % IV SOLN
Freq: Once | INTRAVENOUS | Status: AC
  Administered 2013-04-09: 02:00:00 via INTRAVENOUS

## 2013-04-09 MED ORDER — HYDROCODONE-ACETAMINOPHEN 5-325 MG PO TABS: 1.0000 | ORAL_TABLET | ORAL | Status: DC | PRN

## 2013-04-09 NOTE — ED Notes (Signed)
right calf is red and swollen and hot to touch and measures 20.5inches and the left one measures17.5inches.

## 2013-04-09 NOTE — ED Provider Notes (Signed)
History     CSN: [id number]  Arrival date & time 04/09/13  0115   First MD Initiated Contact with Patient 04/09/13 0135      Chief Complaint  Patient presents with  . Leg Swelling    right leg swollen for 3 days  . Muscle Pain    right ribcage pain    (Consider location/radiation/quality/duration/timing/severity/associated sxs/prior treatment) HPI HPI Comments: Wayne Spencer is a 42 y.o. male who presents to the Emergency Department complaining of swelling and pain to his right leg x 3 days. It turned red last night. He is not aware of any injury. He denies fever, chills, nausea, vomiting, chest pain, shortness of breath.  PCP Dr. Everette Rank  Past Medical History  Diagnosis Date  . Cellulitis     No past surgical history on file.  No family history on file.  History  Substance Use Topics  . Smoking status: Not on file  . Smokeless tobacco: Not on file  . Alcohol Use: Not on file      Review of Systems  Constitutional: Negative for fever.       10 Systems reviewed and are negative for acute change except as noted in the HPI.  HENT: Negative for congestion.   Eyes: Negative for discharge and redness.  Respiratory: Negative for cough and shortness of breath.   Cardiovascular: Negative for chest pain.  Gastrointestinal: Negative for vomiting and abdominal pain.  Musculoskeletal: Negative for back pain.       Swollen and red right leg  Skin: Negative for rash.  Neurological: Negative for syncope, numbness and headaches.  Psychiatric/Behavioral:       No behavior change.    Allergies  Tramadol  Home Medications   Current Outpatient Rx  Name  Route  Sig  Dispense  Refill  . ALPRAZolam (XANAX) 0.5 MG tablet   Oral   Take 0.5 mg by mouth 3 (three) times daily as needed for sleep.         . cyclobenzaprine (FLEXERIL) 10 MG tablet   Oral   Take 10 mg by mouth 3 (three) times daily as needed for muscle spasms.         Marland Kitchen HYDROcodone-acetaminophen (LORTAB)  7.5-500 MG per tablet   Oral   Take 1 tablet by mouth every 6 (six) hours as needed for pain.         . potassium chloride SA (K-DUR,KLOR-CON) 20 MEQ tablet   Oral   Take 20 mEq by mouth daily.           BP 146/82  Pulse 122  Temp(Src) 99.5 F (37.5 C) (Oral)  Resp 24  Ht 6\' 3"  (1.905 m)  Wt 260 lb (117.935 kg)  BMI 32.5 kg/m2  SpO2 98%  Physical Exam  Nursing note and vitals reviewed. Constitutional: He appears well-developed and well-nourished.  Awake, alert, nontoxic appearance.  HENT:  Head: Normocephalic and atraumatic.  Right Ear: External ear normal.  Left Ear: External ear normal.  Eyes: EOM are normal. Pupils are equal, round, and reactive to light.  Neck: Neck supple.  Cardiovascular: Normal rate and intact distal pulses.   Pulmonary/Chest: Effort normal and breath sounds normal. He exhibits no tenderness.  Abdominal: Soft. Bowel sounds are normal. There is no tenderness. There is no rebound.  Musculoskeletal: He exhibits no tenderness.  Baseline ROM, no obvious new focal weakness.Right leg with edema 3+ and redness from the knee down, hot to touch. Left leg large , trace edema.  Neurological:  Mental status and motor strength appears baseline for patient and situation.  Skin: No rash noted.  Psychiatric: He has a normal mood and affect.    ED Course  Procedures (including critical care time) Results for orders placed during the hospital encounter of 04/09/13  CBC WITH DIFFERENTIAL      Result Value Range   WBC 12.7 (*) 4.0 - 10.5 K/uL   RBC 3.67 (*) 4.22 - 5.81 MIL/uL   Hemoglobin 12.3 (*) 13.0 - 17.0 g/dL   HCT 33.8 (*) 39.0 - 52.0 %   MCV 92.1  78.0 - 100.0 fL   MCH 33.5  26.0 - 34.0 pg   MCHC 36.4 (*) 30.0 - 36.0 g/dL   RDW 13.4  11.5 - 15.5 %   Platelets 170  150 - 400 K/uL   Neutrophils Relative 73  43 - 77 %   Neutro Abs 9.3 (*) 1.7 - 7.7 K/uL   Lymphocytes Relative 15  12 - 46 %   Lymphs Abs 1.9  0.7 - 4.0 K/uL   Monocytes Relative 11   3 - 12 %   Monocytes Absolute 1.4 (*) 0.1 - 1.0 K/uL   Eosinophils Relative 1  0 - 5 %   Eosinophils Absolute 0.2  0.0 - 0.7 K/uL   Basophils Relative 0  0 - 1 %   Basophils Absolute 0.0  0.0 - 0.1 K/uL  BASIC METABOLIC PANEL      Result Value Range   Sodium 136  135 - 145 mEq/L   Potassium 3.1 (*) 3.5 - 5.1 mEq/L   Chloride 99  96 - 112 mEq/L   CO2 23  19 - 32 mEq/L   Glucose, Bld 122 (*) 70 - 99 mg/dL   BUN 20  6 - 23 mg/dL   Creatinine, Ser 2.63 (*) 0.50 - 1.35 mg/dL   Calcium 8.5  8.4 - 10.5 mg/dL   GFR calc non Af Amer 29 (*) >90 mL/min   GFR calc Af Amer 33 (*) >90 mL/min      0300 Reviewed results with patient. His pain is better with morphine. He will be given vancomycin and return tomorrow for a second dose. At that time he can be evaluated if needs more IV ABX. MDM  Patient presents with three days of leg pain and swelling and one day of erythema. WBC is slightly elevated. Potassium is low. Given potassium. Given vancomycin. Patient to return tomorrow for second dose of vancomycin. Rx for doxycycline #20, hydrocodone #20 and zofran #20. Pt stable in ED with no significant deterioration in condition.The patient appears reasonably screened and/or stabilized for discharge and I doubt any other medical condition or other [facility] requiring further screening, evaluation, or treatment in the ED at this time prior to discharge.  MDM Reviewed: nursing note and vitals Interpretation: labs           Gypsy Balsam. Olin Hauser, MD 04/09/13 [phone]

## 2013-04-10 ENCOUNTER — Emergency Department (HOSPITAL_COMMUNITY)
Admission: EM | Admit: 2013-04-10 | Discharge: 2013-04-10 | Disposition: A | Discharge status: Home / Self Care | Payer: Self-pay | Attending: Emergency Medicine | Admitting: Emergency Medicine

## 2013-04-10 ENCOUNTER — Encounter (HOSPITAL_COMMUNITY): Payer: Self-pay | Admitting: Emergency Medicine

## 2013-04-10 DIAGNOSIS — R609 Edema, unspecified: Secondary | ICD-10-CM | POA: Insufficient documentation

## 2013-04-10 DIAGNOSIS — L03115 Cellulitis of right lower limb: Secondary | ICD-10-CM

## 2013-04-10 DIAGNOSIS — F17200 Nicotine dependence, unspecified, uncomplicated: Secondary | ICD-10-CM | POA: Insufficient documentation

## 2013-04-10 DIAGNOSIS — L02419 Cutaneous abscess of limb, unspecified: Secondary | ICD-10-CM | POA: Insufficient documentation

## 2013-04-10 DIAGNOSIS — Z79899 Other long term (current) drug therapy: Secondary | ICD-10-CM | POA: Insufficient documentation

## 2013-04-10 DIAGNOSIS — I10 Essential (primary) hypertension: Secondary | ICD-10-CM | POA: Insufficient documentation

## 2013-04-10 HISTORY — DX: Essential (primary) hypertension: I10

## 2013-04-10 MED ORDER — OXYCODONE-ACETAMINOPHEN 5-325 MG PO TABS: 1.0000 | ORAL_TABLET | ORAL | Status: DC | PRN

## 2013-04-10 MED ORDER — OXYCODONE-ACETAMINOPHEN 5-325 MG PO TABS
1.0000 | ORAL_TABLET | Freq: Once | ORAL | Status: AC
  Administered 2013-04-10: 1 via ORAL
  Filled 2013-04-10: qty 1

## 2013-04-10 MED ORDER — VANCOMYCIN HCL IN DEXTROSE 1-5 GM/200ML-% IV SOLN
1000.0000 mg | Freq: Once | INTRAVENOUS | Status: AC
  Administered 2013-04-10: 1000 mg via INTRAVENOUS
  Filled 2013-04-10: qty 200

## 2013-04-10 NOTE — ED Provider Notes (Signed)
History     CSN: [id number]  Arrival date & time 04/10/13  0245   First MD Initiated Contact with Patient 04/10/13 [phone]      Chief Complaint  Patient presents with  . Cellulitis    (Consider location/radiation/quality/duration/timing/severity/associated sxs/prior treatment) The history is provided by the patient.   42 year old male with a four-day history of pain, swelling, redness in the right leg. He was seen in emergency room yesterday and diagnosed with cellulitis and told to come back tonight for a second dose of intravenous vancomycin. He was sent home with oral doxycycline and has a followup appointment scheduled with his PCP in 2 days. He states that there has been some improvement since yesterday. Erythema is not as intense in pain is less. Pain is still moderately severe and he rates at 8/10. He denies fever, chills, sweats. He denies chest pain and dyspnea. He has had problems with cellulitis in that leg previously.  Past Medical History  Diagnosis Date  . Cellulitis   . Hypertension     History reviewed. No pertinent past surgical history.  No family history on file.  History  Substance Use Topics  . Smoking status: Current Every Day Smoker  . Smokeless tobacco: Not on file  . Alcohol Use: Yes      Review of Systems  All other systems reviewed and are negative.    Allergies  Tramadol  Home Medications   Current Outpatient Rx  Name  Route  Sig  Dispense  Refill  . ALPRAZolam (XANAX) 0.5 MG tablet   Oral   Take 0.5 mg by mouth 3 (three) times daily as needed for sleep.         . cyclobenzaprine (FLEXERIL) 10 MG tablet   Oral   Take 10 mg by mouth 3 (three) times daily as needed for muscle spasms.         Marland Kitchen HYDROcodone-acetaminophen (LORTAB) 7.5-500 MG per tablet   Oral   Take 1 tablet by mouth every 6 (six) hours as needed for pain.         Marland Kitchen HYDROcodone-acetaminophen (NORCO/VICODIN) 5-325 MG per tablet   Oral   Take 1 tablet by mouth  every 4 (four) hours as needed for pain.   20 tablet   0   . ondansetron (ZOFRAN ODT) 4 MG disintegrating tablet   Oral   Take 1 tablet (4 mg total) by mouth every 8 (eight) hours as needed for nausea.   20 tablet   0   . potassium chloride SA (K-DUR,KLOR-CON) 20 MEQ tablet   Oral   Take 20 mEq by mouth daily.         Marland Kitchen doxycycline (VIBRAMYCIN) 100 MG capsule   Oral   Take 1 capsule (100 mg total) by mouth 2 (two) times daily.   20 capsule   0     BP 156/99  Pulse 112  Temp(Src) 98.7 F (37.1 C) (Oral)  Resp 20  Ht 6' 3.5" (1.918 m)  Wt 260 lb (117.935 kg)  BMI 32.06 kg/m2  SpO2 99%  Physical Exam  Nursing note and vitals reviewed.  42 year old male, resting comfortably and in no acute distress. Vital signs are significant for tachycardia with heart rate of 112, and hypertension with blood pressure 156/99. Oxygen saturation is 99%, which is normal. Head is normocephalic and atraumatic. PERRLA, EOMI. Oropharynx is clear. Neck is nontender and supple without adenopathy or JVD. Back is nontender and there is no CVA tenderness. Lungs  are clear without rales, wheezes, or rhonchi. Chest is nontender. Heart has regular rate and rhythm without murmur. Abdomen is soft, flat, nontender without masses or hepatosplenomegaly and peristalsis is normoactive. Extremities: Right leg is swollen and erythematous and slightly warm to touch. There is 2+ edema on the left and 3+ edema in the right. There no palpable cords. Capillary refill is prompt and pulses are strong. Skin is warm and dry without rash. Neurologic: Mental status is normal, cranial nerves are intact, there are no motor or sensory deficits.  ED Course  Procedures (including critical care time)   1. Cellulitis of right leg   2. Peripheral edema       MDM  Cellulitis of the right lower extremity. Peripheral edema. Tobacco abuse. Old records are reviewed and he was seen yesterday with diagnosis of cellulitis. He  also has history of having had a venous Doppler of that leg in 2012 which was negative. At that time, he apparently had an episode of cellulitis which was treated as an outpatient. He did not get adequate pain relief at home with hydrocodone and acetaminophen and will be given a trial of oxycodone-acetaminophen.  He got significantly better apparently oxycodone-acetaminophen so is given a prescription for same. He states that he has a prescription for a diuretic at home which is 40 mg. Presumably, this is furosemide. He is advised to double his dose for the next 3 days. He has a scheduled appointment with his PCP in 2 days which he is to keep.     Delora Fuel, MD [phone]

## 2013-04-10 NOTE — ED Notes (Signed)
Patient c/o right leg cellulitis; was seen last night and told to come back tonight to get IV antibiotic.

## 2013-04-10 NOTE — ED Notes (Signed)
Pt reports cellulitis in right leg.  Was seen for same in department last night and instructed to return tonight for additional dose of IV antibiotics.  Right lower leg noticeably swollen and reddened.

## 2013-08-09 ENCOUNTER — Encounter (HOSPITAL_COMMUNITY): Payer: Self-pay | Admitting: *Deleted

## 2013-08-09 ENCOUNTER — Inpatient Hospital Stay (HOSPITAL_COMMUNITY)
Admission: EM | Admit: 2013-08-09 | Discharge: 2013-08-12 | DRG: 247 | Disposition: A | Discharge status: Home / Self Care | Payer: 59 | Attending: Internal Medicine | Admitting: Internal Medicine

## 2013-08-09 ENCOUNTER — Emergency Department (HOSPITAL_COMMUNITY): Payer: 59

## 2013-08-09 DIAGNOSIS — N049 Nephrotic syndrome with unspecified morphologic changes: Secondary | ICD-10-CM | POA: Diagnosis present

## 2013-08-09 DIAGNOSIS — Z79899 Other long term (current) drug therapy: Secondary | ICD-10-CM

## 2013-08-09 DIAGNOSIS — R7989 Other specified abnormal findings of blood chemistry: Secondary | ICD-10-CM

## 2013-08-09 DIAGNOSIS — G8929 Other chronic pain: Secondary | ICD-10-CM | POA: Diagnosis present

## 2013-08-09 DIAGNOSIS — N184 Chronic kidney disease, stage 4 (severe): Secondary | ICD-10-CM | POA: Diagnosis present

## 2013-08-09 DIAGNOSIS — E785 Hyperlipidemia, unspecified: Secondary | ICD-10-CM | POA: Diagnosis present

## 2013-08-09 DIAGNOSIS — I129 Hypertensive chronic kidney disease with stage 1 through stage 4 chronic kidney disease, or unspecified chronic kidney disease: Secondary | ICD-10-CM | POA: Diagnosis present

## 2013-08-09 DIAGNOSIS — Z720 Tobacco use: Secondary | ICD-10-CM | POA: Diagnosis present

## 2013-08-09 DIAGNOSIS — I10 Essential (primary) hypertension: Secondary | ICD-10-CM | POA: Diagnosis present

## 2013-08-09 DIAGNOSIS — Z23 Encounter for immunization: Secondary | ICD-10-CM

## 2013-08-09 DIAGNOSIS — T502X5A Adverse effect of carbonic-anhydrase inhibitors, benzothiadiazides and other diuretics, initial encounter: Secondary | ICD-10-CM | POA: Diagnosis present

## 2013-08-09 DIAGNOSIS — Z8249 Family history of ischemic heart disease and other diseases of the circulatory system: Secondary | ICD-10-CM

## 2013-08-09 DIAGNOSIS — E876 Hypokalemia: Secondary | ICD-10-CM | POA: Diagnosis present

## 2013-08-09 DIAGNOSIS — F17200 Nicotine dependence, unspecified, uncomplicated: Secondary | ICD-10-CM | POA: Diagnosis present

## 2013-08-09 DIAGNOSIS — N179 Acute kidney failure, unspecified: Secondary | ICD-10-CM | POA: Diagnosis present

## 2013-08-09 DIAGNOSIS — I2510 Atherosclerotic heart disease of native coronary artery without angina pectoris: Secondary | ICD-10-CM | POA: Diagnosis present

## 2013-08-09 DIAGNOSIS — I214 Non-ST elevation (NSTEMI) myocardial infarction: Principal | ICD-10-CM | POA: Diagnosis present

## 2013-08-09 DIAGNOSIS — R778 Other specified abnormalities of plasma proteins: Secondary | ICD-10-CM

## 2013-08-09 DIAGNOSIS — E1169 Type 2 diabetes mellitus with other specified complication: Secondary | ICD-10-CM | POA: Diagnosis present

## 2013-08-09 DIAGNOSIS — R079 Chest pain, unspecified: Secondary | ICD-10-CM

## 2013-08-09 DIAGNOSIS — E860 Dehydration: Secondary | ICD-10-CM | POA: Diagnosis present

## 2013-08-09 DIAGNOSIS — N289 Disorder of kidney and ureter, unspecified: Secondary | ICD-10-CM

## 2013-08-09 HISTORY — DX: Other chronic pain: G89.29

## 2013-08-09 HISTORY — DX: Atherosclerotic heart disease of native coronary artery without angina pectoris: I25.10

## 2013-08-09 HISTORY — DX: Tobacco use: Z72.0

## 2013-08-09 HISTORY — DX: Nephrotic syndrome with unspecified morphologic changes: N04.9

## 2013-08-09 HISTORY — DX: Headache, unspecified: R51.9

## 2013-08-09 HISTORY — DX: Hyperlipidemia, unspecified: E78.5

## 2013-08-09 HISTORY — DX: Headache: R51

## 2013-08-09 LAB — BASIC METABOLIC PANEL
BUN: 13 mg/dL (ref 6–23)
CO2: 26 mEq/L (ref 19–32)
Calcium: 8.9 mg/dL (ref 8.4–10.5)
Chloride: 101 mEq/L (ref 96–112)
Creatinine, Ser: 2.99 mg/dL — ABNORMAL HIGH (ref 0.50–1.35)
GFR calc Af Amer: 28 mL/min — ABNORMAL LOW (ref >90)
GFR calc non Af Amer: 24 mL/min — ABNORMAL LOW (ref >90)
Glucose, Bld: 102 mg/dL — ABNORMAL HIGH (ref 70–99)
Potassium: 2.8 mEq/L — ABNORMAL LOW (ref 3.5–5.1)
Sodium: 136 mEq/L (ref 135–145)

## 2013-08-09 LAB — MAGNESIUM: Magnesium: 1.6 mg/dL (ref 1.5–2.5)

## 2013-08-09 LAB — CBC WITH DIFFERENTIAL/PLATELET
Basophils Absolute: 0 10*3/uL (ref 0.0–0.1)
Basophils Relative: 1 % (ref 0–1)
Eosinophils Absolute: 0.3 10*3/uL (ref 0.0–0.7)
Eosinophils Relative: 4 % (ref 0–5)
HCT: 37 % — ABNORMAL LOW (ref 39.0–52.0)
Hemoglobin: 13.2 g/dL (ref 13.0–17.0)
Lymphocytes Relative: 36 % (ref 12–46)
Lymphs Abs: 3.1 10*3/uL (ref 0.7–4.0)
MCH: 32.8 pg (ref 26.0–34.0)
MCHC: 35.7 g/dL (ref 30.0–36.0)
MCV: 91.8 fL (ref 78.0–100.0)
Monocytes Absolute: 0.7 10*3/uL (ref 0.1–1.0)
Monocytes Relative: 8 % (ref 3–12)
Neutro Abs: 4.3 10*3/uL (ref 1.7–7.7)
Neutrophils Relative %: 51 % (ref 43–77)
Platelets: 159 10*3/uL (ref 150–400)
RBC: 4.03 MIL/uL — ABNORMAL LOW (ref 4.22–5.81)
RDW: 13.1 % (ref 11.5–15.5)
WBC: 8.4 10*3/uL (ref 4.0–10.5)

## 2013-08-09 LAB — HEPARIN LEVEL (UNFRACTIONATED)
Heparin Unfractionated: <0.1 IU/mL — ABNORMAL LOW (ref 0.30–0.70)
Heparin Unfractionated: <0.1 IU/mL — ABNORMAL LOW (ref 0.30–0.70)

## 2013-08-09 LAB — PROTIME-INR
INR: 0.98 (ref 0.00–1.49)
Prothrombin Time: 12.8 seconds (ref 11.6–15.2)

## 2013-08-09 LAB — TSH: TSH: 4.001 u[IU]/mL (ref 0.350–4.500)

## 2013-08-09 LAB — TROPONIN I
Troponin I: 1.21 ng/mL (ref <0.30)
Troponin I: 1.51 ng/mL (ref <0.30)

## 2013-08-09 LAB — HEMOGLOBIN A1C
Hgb A1c MFr Bld: 5.4 % (ref <5.7)
Mean Plasma Glucose: 108 mg/dL (ref <117)

## 2013-08-09 LAB — MRSA PCR SCREENING: MRSA by PCR: NEGATIVE

## 2013-08-09 MED ORDER — SODIUM CHLORIDE 0.9 % IJ SOLN: 3.0000 mL | Freq: Two times a day (BID) | INTRAMUSCULAR | Status: DC

## 2013-08-09 MED ORDER — SODIUM CHLORIDE 0.9 % IV SOLN: 250.0000 mL | INTRAVENOUS | Status: DC | PRN

## 2013-08-09 MED ORDER — HEPARIN (PORCINE) IN NACL 100-0.45 UNIT/ML-% IJ SOLN
1000.0000 [IU]/h | INTRAMUSCULAR | Status: DC
  Filled 2013-08-09: qty 250

## 2013-08-09 MED ORDER — NITROGLYCERIN 0.4 MG SL SUBL
0.4000 mg | SUBLINGUAL_TABLET | Freq: Once | SUBLINGUAL | Status: AC
  Administered 2013-08-09: 0.4 mg via SUBLINGUAL

## 2013-08-09 MED ORDER — NITROGLYCERIN 0.4 MG SL SUBL
0.4000 mg | SUBLINGUAL_TABLET | SUBLINGUAL | Status: DC | PRN
  Administered 2013-08-09: 0.4 mg via SUBLINGUAL
  Filled 2013-08-09: qty 25

## 2013-08-09 MED ORDER — ZOLPIDEM TARTRATE 5 MG PO TABS: 5.0000 mg | ORAL_TABLET | Freq: Every evening | ORAL | Status: DC | PRN

## 2013-08-09 MED ORDER — ONDANSETRON HCL 4 MG/2ML IJ SOLN
4.0000 mg | Freq: Four times a day (QID) | INTRAMUSCULAR | Status: DC | PRN
  Filled 2013-08-09: qty 2

## 2013-08-09 MED ORDER — HEPARIN BOLUS VIA INFUSION
4000.0000 [IU] | Freq: Once | INTRAVENOUS | Status: AC
  Administered 2013-08-09: 4000 [IU] via INTRAVENOUS
  Filled 2013-08-09: qty 4000

## 2013-08-09 MED ORDER — SODIUM CHLORIDE 0.9 % IJ SOLN: 3.0000 mL | INTRAMUSCULAR | Status: DC | PRN

## 2013-08-09 MED ORDER — METOPROLOL TARTRATE 1 MG/ML IV SOLN
5.0000 mg | Freq: Once | INTRAVENOUS | Status: AC
  Administered 2013-08-09: 5 mg via INTRAVENOUS
  Filled 2013-08-09: qty 5

## 2013-08-09 MED ORDER — HEPARIN BOLUS VIA INFUSION
3000.0000 [IU] | Freq: Once | INTRAVENOUS | Status: AC
  Administered 2013-08-09: 3000 [IU] via INTRAVENOUS
  Filled 2013-08-09: qty 3000

## 2013-08-09 MED ORDER — POTASSIUM CHLORIDE CRYS ER 20 MEQ PO TBCR
40.0000 meq | EXTENDED_RELEASE_TABLET | Freq: Once | ORAL | Status: AC
  Administered 2013-08-09: 40 meq via ORAL
  Filled 2013-08-09: qty 2

## 2013-08-09 MED ORDER — NITROGLYCERIN 2 % TD OINT
1.0000 [in_us] | TOPICAL_OINTMENT | Freq: Once | TRANSDERMAL | Status: AC
  Administered 2013-08-09: 1 [in_us] via TOPICAL

## 2013-08-09 MED ORDER — NITROGLYCERIN 0.4 MG SL SUBL
0.4000 mg | SUBLINGUAL_TABLET | Freq: Once | SUBLINGUAL | Status: AC
  Administered 2013-08-09: 0.4 mg via SUBLINGUAL
  Filled 2013-08-09: qty 25

## 2013-08-09 MED ORDER — POTASSIUM CHLORIDE CRYS ER 20 MEQ PO TBCR
20.0000 meq | EXTENDED_RELEASE_TABLET | Freq: Every day | ORAL | Status: DC
  Filled 2013-08-09 (×3): qty 1

## 2013-08-09 MED ORDER — CYCLOBENZAPRINE HCL 10 MG PO TABS
10.0000 mg | ORAL_TABLET | Freq: Three times a day (TID) | ORAL | Status: DC | PRN
  Administered 2013-08-09 – 2013-08-12 (×4): 10 mg via ORAL
  Filled 2013-08-09 (×4): qty 1

## 2013-08-09 MED ORDER — ATORVASTATIN CALCIUM 80 MG PO TABS
80.0000 mg | ORAL_TABLET | Freq: Every day | ORAL | Status: DC
  Administered 2013-08-09 – 2013-08-11 (×3): 80 mg via ORAL
  Filled 2013-08-09 (×4): qty 1

## 2013-08-09 MED ORDER — ALPRAZOLAM 0.5 MG PO TABS
0.5000 mg | ORAL_TABLET | Freq: Three times a day (TID) | ORAL | Status: DC | PRN
  Administered 2013-08-09 – 2013-08-12 (×5): 0.5 mg via ORAL
  Filled 2013-08-09: qty 2
  Filled 2013-08-09: qty 1
  Filled 2013-08-09 (×2): qty 2
  Filled 2013-08-09: qty 1

## 2013-08-09 MED ORDER — SODIUM CHLORIDE 0.9 % IV SOLN
INTRAVENOUS | Status: DC
  Administered 2013-08-10: 03:00:00 via INTRAVENOUS

## 2013-08-09 MED ORDER — METOPROLOL TARTRATE 25 MG PO TABS
25.0000 mg | ORAL_TABLET | Freq: Two times a day (BID) | ORAL | Status: DC
  Administered 2013-08-09 – 2013-08-12 (×6): 25 mg via ORAL
  Filled 2013-08-09 (×9): qty 1

## 2013-08-09 MED ORDER — HEPARIN (PORCINE) IN NACL 100-0.45 UNIT/ML-% IJ SOLN
1750.0000 [IU]/h | INTRAMUSCULAR | Status: DC
  Administered 2013-08-09 (×2): 1400 [IU]/h via INTRAVENOUS
  Administered 2013-08-10: 1750 [IU]/h via INTRAVENOUS
  Filled 2013-08-09 (×3): qty 250

## 2013-08-09 MED ORDER — ASPIRIN 325 MG PO TABS
325.0000 mg | ORAL_TABLET | Freq: Once | ORAL | Status: AC
  Administered 2013-08-09: 325 mg via ORAL
  Filled 2013-08-09: qty 1

## 2013-08-09 MED ORDER — HEPARIN (PORCINE) IN NACL 100-0.45 UNIT/ML-% IJ SOLN
1000.0000 [IU]/h | Freq: Once | INTRAMUSCULAR | Status: AC
  Administered 2013-08-09: 1000 [IU]/h via INTRAVENOUS
  Filled 2013-08-09: qty 250

## 2013-08-09 MED ORDER — HEPARIN BOLUS VIA INFUSION
5000.0000 [IU] | Freq: Once | INTRAVENOUS | Status: AC
  Administered 2013-08-09: 5000 [IU] via INTRAVENOUS

## 2013-08-09 MED ORDER — HYDROCODONE-ACETAMINOPHEN 7.5-325 MG PO TABS
1.0000 | ORAL_TABLET | Freq: Once | ORAL | Status: AC
  Administered 2013-08-09: 1 via ORAL
  Filled 2013-08-09: qty 1

## 2013-08-09 MED ORDER — ASPIRIN EC 81 MG PO TBEC
81.0000 mg | DELAYED_RELEASE_TABLET | Freq: Every day | ORAL | Status: DC
  Administered 2013-08-10 – 2013-08-12 (×3): 81 mg via ORAL
  Filled 2013-08-09 (×4): qty 1

## 2013-08-09 MED ORDER — HYDROCODONE-ACETAMINOPHEN 5-325 MG PO TABS
1.0000 | ORAL_TABLET | ORAL | Status: DC | PRN
  Administered 2013-08-09 (×2): 1 via ORAL
  Filled 2013-08-09 (×2): qty 1

## 2013-08-09 MED ORDER — HYDROCODONE-ACETAMINOPHEN 7.5-325 MG PO TABS
1.0000 | ORAL_TABLET | Freq: Four times a day (QID) | ORAL | Status: DC | PRN
  Administered 2013-08-10 – 2013-08-11 (×6): 1 via ORAL
  Filled 2013-08-09 (×6): qty 1

## 2013-08-09 MED ORDER — NITROGLYCERIN 2 % TD OINT
TOPICAL_OINTMENT | TRANSDERMAL | Status: AC
  Filled 2013-08-09: qty 1

## 2013-08-09 MED ORDER — POTASSIUM CHLORIDE 20 MEQ PO PACK: 40.0000 meq | PACK | Freq: Once | ORAL | Status: DC

## 2013-08-09 MED ORDER — HYDROCODONE-ACETAMINOPHEN 7.5-325 MG PO TABS: 1.0000 | ORAL_TABLET | Freq: Four times a day (QID) | ORAL | Status: DC | PRN

## 2013-08-09 NOTE — Progress Notes (Addendum)
ANTICOAGULATION CONSULT NOTE - Initial Consult  Pharmacy Consult for heparin Indication: chest pain/ACS  Allergies  Allergen Reactions  . Mobic [Meloxicam]     Unknown   . Tramadol     REACTION: itching, breaking out.    Patient Measurements: Height: 6\' 3"  (190.5 cm) Weight: 265 lb (120.203 kg) IBW/kg (Calculated) : 84.5 Heparin Dosing Weight: 110kg  Vital Signs: Temp: 97.6 F (36.4 C) (09/09 0647) Temp src: Oral (09/09 0647) BP: 141/88 mmHg (09/09 0647) Pulse Rate: 84 (09/09 0647)  Labs:  Recent Labs  08/09/13 0342  HGB 13.2  HCT 37.0*  PLT 159  CREATININE 2.99*  TROPONINI 1.21*    Estimated Creatinine Clearance: 45.4 ml/min (by C-G formula based on Cr of 2.99).   Medical History: Past Medical History  Diagnosis Date  . Cellulitis   . Hypertension   . Nephrotic syndrome   . Hyperlipidemia   . Tobacco abuse   . Chronic headache     Assessment: 89 YOM who was transferred from [facility] with chest pain. EKG showed NSR with no ST/T changes. He does have a history of nephrotic syndrome, and his SCr currently it 2.99. Renal to be consulted regarding the timing of cath. He received a 5000unit bolus of heparin and was started at a rate of 1000units/hr at [facility] at ~0500 this morning. Heparin is currently running at the same rate. The order was discontinued on the [facility] when he transferred, so I re-entered the gtt at 1000units/hr. Baseline CBC is good- Hct is slightly low, but both Hgb and plts are WNL. No bleeding noted.  Goal of Therapy:  Heparin level 0.3-0.7 units/ml Monitor platelets by anticoagulation protocol: Yes   Plan:  1. Continue heparin at current rate of 1000units/hr for now 2. Checking a HL at 1100 and will make adjustments from there 3. Daily HL and CBC 4. Follow up timing of cath, renal function, and any signs of bleeding  Wayne Spencer D. Wayne Spencer, PharmD Clinical Pharmacist Pager: [phone] 08/09/2013 9:06 AM   ADDENDUM Level was drawn about an hour early,  but was undetectable, and would not have been in range if drawn on time. No bleeding noted.  Plan: -heparin bolus with 3000units IV x1 -increase heparin drip rate to 1400units/hr -6 hour HL  Wayne Spencer D. Wayne Spencer, PharmD Clinical Pharmacist Pager: [phone] 08/09/2013 11:52 AM

## 2013-08-09 NOTE — Care Management Note (Unsigned)
    Page 1 of 1   08/10/2013     12:07:09 PM   CARE MANAGEMENT NOTE 08/10/2013  Patient:  Wayne Spencer, Wayne Spencer   Account Number:  [id number]  Date Initiated:  08/09/2013  Documentation initiated by:  Narvel Kozub  Subjective/Objective Assessment:   PT ADM ON 08/09/13 WITH CHEST PAIN, ARF.  PTA, PT INDEPENDENT, LIVES WITH SPOUSE.     Action/Plan:   WILL FOLLOW FOR HOME NEEDS AS PT PROGRESSES.   Anticipated DC Date:  08/12/2013   Anticipated DC Plan:  [place]  In-house referral  Financial Counselor      DC Planning Services  CM consult      Choice offered to / List presented to:             Status of service:  In process, will continue to follow Medicare Important Message given?   (If response is "NO", the following Medicare IM given date fields will be blank) Date Medicare IM given:   Date Additional Medicare IM given:    Discharge Disposition:    Per UR Regulation:  Reviewed for med. necessity/level of care/duration of stay  If discussed at [place] of Stay Meetings, dates discussed:    Comments:  08/10/13 Caitlyne Ingham,RN,BSN [id number] CM REFERRAL THAT PT [place].  LEFT MESSAGE FOR FINANCIAL COUNSELOR, DEBORAH GOLDSTON, TO ASSESS PT AND DISCUSS BILL WITH PT TO SEE IF THERE IS ANY ASSISTANCE THAT CAN BE PROVIDED.

## 2013-08-09 NOTE — Consult Note (Signed)
I have personally seen and examined this patient and agree with the assessment/plan as outlined above by Wendi Snipes MD (PGY2). Mr. 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 Everette Rank) to 2.5 (03/2013), probably from his untreated (work up negative) nephrotic syndrome. His current renal function does not appear too far from baseline, and with diuretics/ACE-I withheld- would recommend LHC tomorrow with gentle NS drip pre (161mL/h X6hrs) and post (75mL/hrX12 hrs) post cath. The risks of AKI and HD needs are cited above. Patient informed of risks and benefits. Will repeat SPEP/FLC based on suggestive reports in 2011.   Tomeeka Plaugher K.,MD 08/09/2013 2:38 PM

## 2013-08-09 NOTE — ED Notes (Signed)
CRITICAL VALUE ALERT  Critical value received:  Troponin 1.21  Date of notification:  08/09/13  Time of notification:  0430  Critical value read back:yes  Nurse who received alert:  Quincy Sheehan, RN  MD notified (1st page):  Lacinda Axon  Time of first page:  [phone]  MD notified (2nd page):  Time of second page:  Responding MD:  Lacinda Axon  Time MD responded:  [phone]

## 2013-08-09 NOTE — ED Notes (Signed)
Pt reporting pain in mid chest and left breast area.  Also reporting intermittent pain and numbness in left arm. Pt reports symptoms for "about 2 weeks".  Reports some SOB, but reports smoking 3 packs a day and believes this is the cause of the SOB.  Mild occasional nausea as well.

## 2013-08-09 NOTE — H&P (Signed)
History and Physical   Patient ID: Wayne Spencer MRN: [id number], DOB/AGE: 41-Oct-1972   Admit date: 08/09/2013 Date of Consult: 08/09/2013  Primary Physician: Lanette Hampshire, MD Primary Cardiologist: New  HPI:  Wayne Spencer is a 42 y.o. male w/ no prior cardiac history, PMHx s/f nephrotic syndrome (followed by Dr. Lowanda Foster, Cr 0.8-1.0 in 2011), HLD, HTN, tobacco abuse (18 pack year history), family h/o premature CAD and chronic neck pain, headaches who was transferred [facility] to [facility] today for NSTEMI.   He was admitted at [facility] in 2011 for LE edema and cellulitis. He was found to be hypoalbuminemic. 24 hour urine indicated nephrotic range proteinuria. He established care with Dr. Lowanda Foster who recommended Lasix and ACEi therapy. He established primary care with Dr. Everette Rank. He has not followed up with nephrology in some time. He continues to follow-up with Dr. Everette Rank, and has been placed on a statin, Flexeril and analgesics.   He reports experiencing a two week history of intermittent left-sided exertional chest pressure increasing in frequency and severity. More recently, these episodes have begun to occur at rest with radiation to his left shoulder and arm with associated diaphoresis, nausea and lightheadedness. He continues to smoke 3 PPD. He has a significant family h/o premature CAD- father MI in 29s, brother CABG in 15s, passed at the age of 82 from a MI, sister with MI in 52s. He denies palpitations, PND, orthopnea or syncope. He does have trace pedal edema after work which resolves upon waking in the morning. He takes [facility] powder regularly for chronic pain. He continues to struggle with occipital headaches exacerbated by neck movements. He denies fevers, chills, abnormal bleeding. Denies EtOH or illicit drug use. His discomfort progressed to a 8/10 yesterday thus prompting his ED presentation.   There, EKG revealed NSR, no ST/T changes. BMET indicated hypokalemia (2.8-3.1 range), AKI (BUN  13-20/Cr 2.63-2.99). Initial CBC revealed a mild leukocytosis at 12.7. CXR w/o acute cardiopulmonary disease. He received a full dose ASA, NTG SL PRN, heparin.   Chest pain currently 0/10.   Problem List: Past Medical History  Diagnosis Date  . Cellulitis   . Hypertension   . Nephrotic syndrome   . Hyperlipidemia   . Tobacco abuse   . Chronic headache     History reviewed. No pertinent past surgical history.   Allergies:  Allergies  Allergen Reactions  . Mobic [Meloxicam]   . Tramadol     REACTION: itching, breaking out.    Home Medications: Prior to Admission medications   Medication Sig Start Date End Date Taking? Authorizing Provider  furosemide (LASIX) 20 MG tablet Take 20 mg by mouth.   Yes Historical Provider, MD  simvastatin (ZOCOR) 40 MG tablet Take 40 mg by mouth every evening.   Yes Historical Provider, MD  ALPRAZolam Duanne Moron) 0.5 MG tablet Take 0.5 mg by mouth 3 (three) times daily as needed for sleep.    Historical Provider, MD  cyclobenzaprine (FLEXERIL) 10 MG tablet Take 10 mg by mouth 3 (three) times daily as needed for muscle spasms.    Historical Provider, MD  doxycycline (VIBRAMYCIN) 100 MG capsule Take 1 capsule (100 mg total) by mouth 2 (two) times daily. 04/09/13   Gypsy Balsam. Olin Hauser, MD  HYDROcodone-acetaminophen (LORTAB) 7.5-500 MG per tablet Take 1 tablet by mouth every 6 (six) hours as needed for pain.    Historical Provider, MD  HYDROcodone-acetaminophen (NORCO/VICODIN) 5-325 MG per tablet Take 1 tablet by mouth every 4 (four) hours as  needed for pain. 04/09/13   Gypsy Balsam. Olin Hauser, MD  ondansetron (ZOFRAN ODT) 4 MG disintegrating tablet Take 1 tablet (4 mg total) by mouth every 8 (eight) hours as needed for nausea. 04/09/13   Gypsy Balsam. Olin Hauser, MD  oxyCODONE-acetaminophen (PERCOCET/ROXICET) 5-325 MG per tablet Take 1 tablet by mouth every 4 (four) hours as needed for pain. [id number]   Delora Fuel, MD  potassium chloride SA (K-DUR,KLOR-CON) 20 MEQ tablet Take 20 mEq by  mouth daily.    Historical Provider, MD    Inpatient Medications:  . potassium chloride  40 mEq Oral Once   Prescriptions prior to admission  Medication Sig Dispense Refill  . furosemide (LASIX) 20 MG tablet Take 20 mg by mouth.      . simvastatin (ZOCOR) 40 MG tablet Take 40 mg by mouth every evening.      Marland Kitchen ALPRAZolam (XANAX) 0.5 MG tablet Take 0.5 mg by mouth 3 (three) times daily as needed for sleep.      . cyclobenzaprine (FLEXERIL) 10 MG tablet Take 10 mg by mouth 3 (three) times daily as needed for muscle spasms.      Marland Kitchen doxycycline (VIBRAMYCIN) 100 MG capsule Take 1 capsule (100 mg total) by mouth 2 (two) times daily.  20 capsule  0  . HYDROcodone-acetaminophen (LORTAB) 7.5-500 MG per tablet Take 1 tablet by mouth every 6 (six) hours as needed for pain.      Marland Kitchen HYDROcodone-acetaminophen (NORCO/VICODIN) 5-325 MG per tablet Take 1 tablet by mouth every 4 (four) hours as needed for pain.  20 tablet  0  . ondansetron (ZOFRAN ODT) 4 MG disintegrating tablet Take 1 tablet (4 mg total) by mouth every 8 (eight) hours as needed for nausea.  20 tablet  0  . oxyCODONE-acetaminophen (PERCOCET/ROXICET) 5-325 MG per tablet Take 1 tablet by mouth every 4 (four) hours as needed for pain.  20 tablet  0  . potassium chloride SA (K-DUR,KLOR-CON) 20 MEQ tablet Take 20 mEq by mouth daily.        Family History  Problem Relation Age of Onset  . Heart attack Father     90s  . Heart attack Brother     29s, s/p CABG, passed from MI at 21  . Heart attack Sister     28s     History   Social History  . Marital Status: Married    Spouse Name: N/A    Number of Children: N/A  . Years of Education: N/A   Occupational History  . Not on file.   Social History Main Topics  . Smoking status: Current Every Day Smoker -- 3.00 packs/day for 6 years  . Smokeless tobacco: Not on file  . Alcohol Use: Yes     Comment: occasional  . Drug Use: No  . Sexual Activity: Not on file   Other Topics Concern  .  Not on file   Social History Narrative   Lives in [place], [place]. Two children. Works as a Therapist, art and helps change tires.      Review of Systems: General: negative for chills, fever, night sweats or weight changes.  Cardiovascular: positive for chest pain, shortness of breath, edema, negative for dyspnea on exertion, orthopnea, palpitations, paroxysmal nocturnal dyspnea Dermatological: negative for rash Respiratory: negative for cough or wheezing Urologic: negative for hematuria Abdominal: positive for nausea, negative for vomiting, diarrhea, bright red blood per rectum, melena, or hematemesis Neurologic:  negative for visual changes, syncope, or dizziness All other systems reviewed and  are otherwise negative except as noted above.  Physical Exam: Blood pressure 141/88, pulse 84, temperature 97.6 F (36.4 C), temperature source Oral, resp. rate 18, height 6\' 3"  (1.905 m), weight 120.203 kg (265 lb), SpO2 98.00%.    General: Well developed, well nourished, in no acute distress. Head: Normocephalic, atraumatic, sclera non-icteric, no xanthomas, nares are without discharge.  Neck: Negative for carotid bruits. JVD not elevated. Lungs: Clear bilaterally to auscultation without wheezes, rales, or rhonchi. Breathing is unlabored. Heart: RRR with S1 S2. No murmurs, rubs, or gallops appreciated. Abdomen: Soft, non-tender, non-distended with normoactive bowel sounds. No hepatomegaly. No rebound/guarding. No obvious abdominal masses. Msk: Strength and tone appears normal for age. Extremities: No clubbing, cyanosis or edema.  Distal pedal pulses are 2+ and equal bilaterally. Neuro: Alert and oriented X 3. Moves all extremities spontaneously. Psych:  Responds to questions appropriately with a normal affect.  Labs: Recent Labs     08/09/13  0342  WBC  8.4  HGB  13.2  HCT  37.0*  MCV  91.8  PLT  159    Recent Labs Lab 08/09/13 0342  NA 136  K 2.8*  CL 101  CO2 26  BUN 13    CREATININE 2.99*  CALCIUM 8.9  GLUCOSE 102*   Recent Labs     08/09/13  0342  TROPONINI  1.21*   Radiology/Studies: Dg Chest 2 View  08/09/2013   *RADIOLOGY REPORT*  Clinical Data: Pain  CHEST - 2 VIEW  Comparison: 10/30/2011  Findings: The heart size and pulmonary vascularity are normal. The lungs appear clear and expanded without focal air space disease or consolidation. No blunting of the costophrenic angles.  No pneumothorax.  Mediastinal contours appear intact.  Mild hyperinflation.  IMPRESSION: No evidence of active pulmonary disease.   Original Report Authenticated By: Lucienne Capers, M.D.    EKG: NSR, 93 bpm, no ST/T changes  ASSESSMENT AND PLAN:   42 y.o. male w/ no prior cardiac history, PMHx s/f nephrotic syndrome (followed by Dr. Lowanda Foster, Cr 0.8-1.0 in 2011), HLD, HTN, tobacco abuse (18 pack year history), family h/o premature CAD and chronic neck pain, headaches who was transferred [facility] today for NSTEMI.   1. NSTEMI 2. Acute kidney injury w/ history of nephrotic syndrome 3. Hyperlipidemia 4. Hypertension 5. Ongoing tobacco abuse 6. Chronic pain 7. Chronic occipital headache 8. Hypokalemia  The patient reports an intermittent left-sided chest pressure radiating his left arm with associated diaphoresis, nausea and lightheadedness, aggravated by exertion and progressing to rest discomfort over the past two weeks. He has significant cardiac RFs in HTN, HLD, ongoing tobacco abuse (3 PPD) and family h/o premature CAD. Objectively, initial TnI returned elevated at 1.21. EKG w/o ischemic changes. The patient does have evidence of AKI likely attributed to liberal NSAID use, however suspect the over clinical presentation supports primary flow-limiting CAD over reduced troponin excretion from renal insufficiency. This does limit the timing of cath however. The patient's chest pain is currently 0/10. He is heparinized. Hold NSAIDs, continue to hydrate and consider nephrology  consult given his history of nephrotic syndrome. Will start low-dose ASA, add BB, upgrade to atorvastatin 80mg , NTG SL PRN. Consider adding ACEi if renal function improved. Headaches have been well-controlled previously with Tylenol and Flexeril per prior 2011 discharge summary. Narcotics PRN. Caffeine from [facility] powder could contribute as well. Note, he had hallucinations with Dilaudid, intolerance to Mobic and Tramadol. Consider noncontrast head CT and cervical radiograph for headaches. Cycle enzymes for  now. Continue heparin. Replete K. Risk stratify with Hgb A1C, lipid panel. Check TSH and LFTs. Consult renal. Hold Lasix. Admit to inpatient.    Signed, R. Valeria Batman, PA-C 08/09/2013, 8:02 AM   Attending Note:   The patient was seen and examined.  Agree with assessment and plan as noted above.  Changes made to the above note as needed.  Pt presents with classic unstable angina.  He will need a cardiac cath.  He also presents with a hx of nephrotic syndrome and has a creatinine of 2.99.    We will continue IV heparin for now.  We have asked nephrology to see to help guide [place] re: the timing of his cath.  He remains pain free  Will check lipid panal.  Will start empiric lipitor .    [name], [name]., MD, [facility] 08/09/2013, 8:34 AM

## 2013-08-09 NOTE — Consult Note (Signed)
Reason for Consult: CKD with nephropathy Referring Physician: Mertie Moores HPI: Wayne Spencer is a 42 y.o. male who presented to [facility] ED with 2 weeks of progressively worsening chest pain. His chest pain radiates to his L neck, shoulder, and arm and became acutely worse with 8/10 severity prompting his presentation to the ED this morning. He describes that over the last two weeks it has been coming and going frequently at rest and with exertion. He was found to have elevated troponin and cre at [place] and was transferred here to [facility] for cardiology intervention. His chest pain is occasionally accompanied by sweating and it improved with nitro in the ED. He notes frequent LE edema and constipation. He notes L sided vision loss which is stable for several years.  He denies nausea, vomiting, abd pain, diarrhea, dysuria, and hematuria.   He was diagnosed with Nephrotic syndrome approx 3 years ago when he had 25 g protein in a 24 hour urine sample. He states that he has never seen a nephrologist for insurance/cost reasons. He has been taking lasix and KDUR since that time. Additionally he has chronic pain for which he takes approx 4-6 BC powders for daily.     Past Medical History  Diagnosis Date  . Cellulitis   . Hypertension   . Nephrotic syndrome   . Hyperlipidemia   . Tobacco abuse   . Chronic headache     History reviewed. No pertinent past surgical history.  Family History  Problem Relation Age of Onset  . Heart attack Father     34s  . Heart attack Brother     87s, s/p CABG, passed from MI at 72  . Heart attack Sister     37s    Social History:  reports that he has been smoking.  He does not have any smokeless tobacco history on file. He reports that  drinks alcohol. He reports that he does not use illicit drugs.  Allergies:  Allergies  Allergen Reactions  . Mobic [Meloxicam]     Unknown   . Tramadol     REACTION: itching, breaking out.    Medications: I have  reviewed the patient's current medications.   Results for orders placed during the hospital encounter of 08/09/13 (from the past 48 hour(s))  BASIC METABOLIC PANEL     Status: Abnormal   Collection Time    08/09/13  3:42 AM      Result Value Range   Sodium 136  135 - 145 mEq/L   Potassium 2.8 (*) 3.5 - 5.1 mEq/L   Chloride 101  96 - 112 mEq/L   CO2 26  19 - 32 mEq/L   Glucose, Bld 102 (*) 70 - 99 mg/dL   BUN 13  6 - 23 mg/dL   Creatinine, Ser 2.99 (*) 0.50 - 1.35 mg/dL   Calcium 8.9  8.4 - 10.5 mg/dL   GFR calc non Af Amer 24 (*) >90 mL/min   GFR calc Af Amer 28 (*) >90 mL/min   Comment: (NOTE)     The eGFR has been calculated using the CKD EPI equation.     This calculation has not been validated in all clinical situations.     eGFR's persistently <90 mL/min signify possible Chronic Kidney     Disease.  CBC WITH DIFFERENTIAL     Status: Abnormal   Collection Time    08/09/13  3:42 AM      Result Value Range  WBC 8.4  4.0 - 10.5 K/uL   RBC 4.03 (*) 4.22 - 5.81 MIL/uL   Hemoglobin 13.2  13.0 - 17.0 g/dL   HCT 37.0 (*) 39.0 - 52.0 %   MCV 91.8  78.0 - 100.0 fL   MCH 32.8  26.0 - 34.0 pg   MCHC 35.7  30.0 - 36.0 g/dL   RDW 13.1  11.5 - 15.5 %   Platelets 159  150 - 400 K/uL   Neutrophils Relative % 51  43 - 77 %   Neutro Abs 4.3  1.7 - 7.7 K/uL   Lymphocytes Relative 36  12 - 46 %   Lymphs Abs 3.1  0.7 - 4.0 K/uL   Monocytes Relative 8  3 - 12 %   Monocytes Absolute 0.7  0.1 - 1.0 K/uL   Eosinophils Relative 4  0 - 5 %   Eosinophils Absolute 0.3  0.0 - 0.7 K/uL   Basophils Relative 1  0 - 1 %   Basophils Absolute 0.0  0.0 - 0.1 K/uL  TROPONIN I     Status: Abnormal   Collection Time    08/09/13  3:42 AM      Result Value Range   Troponin I 1.21 (*) <0.30 ng/mL   Comment: CRITICAL RESULT CALLED TO, READ BACK BY AND VERIFIED WITH:     HAYMORE,R AT 4:40AM ON 08/09/13 BY FESTERMAN,C                Due to the release kinetics of cTnI,     a negative result within the  first hours     of the onset of symptoms does not rule out     myocardial infarction with certainty.     If myocardial infarction is still suspected,     repeat the test at appropriate intervals.  TROPONIN I     Status: Abnormal   Collection Time    08/09/13 10:00 AM      Result Value Range   Troponin I 1.51 (*) <0.30 ng/mL   Comment:            Due to the release kinetics of cTnI,     a negative result within the first hours     of the onset of symptoms does not rule out     myocardial infarction with certainty.     If myocardial infarction is still suspected,     repeat the test at appropriate intervals.     REPEATED TO VERIFY     CRITICAL RESULT CALLED TO, READ BACK BY AND VERIFIED WITH:     D JACOBS,RN 08/09/13 1127 R HOLMES  MAGNESIUM     Status: None   Collection Time    08/09/13 10:00 AM      Result Value Range   Magnesium 1.6  1.5 - 2.5 mg/dL  HEPARIN LEVEL (UNFRACTIONATED)     Status: Abnormal   Collection Time    08/09/13 10:00 AM      Result Value Range   Heparin Unfractionated <0.10 (*) 0.30 - 0.70 IU/mL   Comment:            IF HEPARIN RESULTS ARE BELOW     EXPECTED VALUES, AND PATIENT     DOSAGE HAS BEEN CONFIRMED,     SUGGEST FOLLOW UP TESTING     OF ANTITHROMBIN III LEVELS.    Dg Chest 2 View  08/09/2013   *RADIOLOGY REPORT*  Clinical Data: Pain  CHEST - 2 VIEW  Comparison: 10/30/2011  Findings: The heart size and pulmonary vascularity are normal. The lungs appear clear and expanded without focal air space disease or consolidation. No blunting of the costophrenic angles.  No pneumothorax.  Mediastinal contours appear intact.  Mild hyperinflation.  IMPRESSION: No evidence of active pulmonary disease.   Original Report Authenticated By: Lucienne Capers, M.D.   ROS: Per HPI, otherwise a 12 point ROS was reviewed.   Blood pressure 141/88, pulse 84, temperature 97.6 F (36.4 C), temperature source Oral, resp. rate 18, height 6\' 3"  (1.905 m), weight 265 lb (120.203  kg), SpO2 98.00%.  Gen: NAD, alert, cooperative with exam HEENT: NCAT, EOMI, PERRL, MMM Neck: Supple, Full ROM CV: RRR, good S1/S2, no murmur Resp: CTABL, no wheezes, non-labored Abd: SNTND, BS present, no guarding or organomegaly, obese, limited by habitus Ext: 2+ pitting edema to mid-shin Neuro: Alert and oriented, No gross deficits Skin: No lesion or rash observed   Assessment/Plan: 1 NSTEMI- Per cardiology  - Pt heparinized, on BB, Statin, and ASA - holding Acei for possible AKI on CKD.  - Needs PCI - Risk of Contrast induced nephropathy is 10%, risk of need of HD post cath is 1% based on use of 50 mL contrast according to the Mehran Simple Risk score - Pre-treatment before cath - 118mL/hr NS for 6 hours prior and 12 hours after, hold Lasix and Acei  2. CKD stage 4, possible AKI on CKD, Nephrotic syndrome - Pre-treat, hold Lasix and Acei before contrast as above - Cre 2.6 in 03/2013, 1.05 in March 2011 - 25 grams protein in 24 hour urine in 01/2010, collect Urine protein Creatinine ratio - Will need Ace or ARB after this acute phase - Avoid NSAIDs - follow daily BMP  3. Hypokalemia - from renal loss due to Lasix use, replace  4. Hypertension: continue BB per cardiology, eventual Ace as above   Laroy Apple, MD [place] Resident, PGY-2 08/09/2013, 11:47 AM

## 2013-08-09 NOTE — ED Provider Notes (Addendum)
CSN: [id number]     Arrival date & time 08/09/13  [id number] History   First MD Initiated Contact with Patient 08/09/13 0340     No chief complaint on file.  chief complaint chest pain (Consider location/radiation/quality/duration/timing/severity/associated sxs/prior Treatment) HPI..... tightness in central chest  for approximately 2 weeks intermittently, getting worse.  Symptoms last for 15-20 minutes with associated dyspnea, diaphoresis. No nausea. Cardiac risk factors include hypertension, hypercholesterolemia, family history, smoking (3 packs per day].  Patient has tried over-the-counter products with minimal relief. He works as a Research officer, trade [employer].  Past Medical History  Diagnosis Date  . Cellulitis   . Hypertension    History reviewed. No pertinent past surgical history. History reviewed. No pertinent family history. History  Substance Use Topics  . Smoking status: Current Every Day Smoker -- 3.00 packs/day  . Smokeless tobacco: Not on file  . Alcohol Use: Yes     Comment: occasional    Review of Systems  All other systems reviewed and are negative.    Allergies  Mobic and Tramadol  Home Medications   Current Outpatient Rx  Name  Route  Sig  Dispense  Refill  . furosemide (LASIX) 20 MG tablet   Oral   Take 20 mg by mouth.         . simvastatin (ZOCOR) 40 MG tablet   Oral   Take 40 mg by mouth every evening.         Marland Kitchen ALPRAZolam (XANAX) 0.5 MG tablet   Oral   Take 0.5 mg by mouth 3 (three) times daily as needed for sleep.         . cyclobenzaprine (FLEXERIL) 10 MG tablet   Oral   Take 10 mg by mouth 3 (three) times daily as needed for muscle spasms.         Marland Kitchen doxycycline (VIBRAMYCIN) 100 MG capsule   Oral   Take 1 capsule (100 mg total) by mouth 2 (two) times daily.   20 capsule   0   . HYDROcodone-acetaminophen (LORTAB) 7.5-500 MG per tablet   Oral   Take 1 tablet by mouth every 6 (six) hours as needed for pain.         Marland Kitchen HYDROcodone-acetaminophen  (NORCO/VICODIN) 5-325 MG per tablet   Oral   Take 1 tablet by mouth every 4 (four) hours as needed for pain.   20 tablet   0   . ondansetron (ZOFRAN ODT) 4 MG disintegrating tablet   Oral   Take 1 tablet (4 mg total) by mouth every 8 (eight) hours as needed for nausea.   20 tablet   0   . oxyCODONE-acetaminophen (PERCOCET/ROXICET) 5-325 MG per tablet   Oral   Take 1 tablet by mouth every 4 (four) hours as needed for pain.   20 tablet   0   . potassium chloride SA (K-DUR,KLOR-CON) 20 MEQ tablet   Oral   Take 20 mEq by mouth daily.          BP 145/90  Pulse 90  Temp(Src) 98.7 F (37.1 C) (Oral)  Resp 20  Ht 6\' 3"  (1.905 m)  Wt 260 lb (117.935 kg)  BMI 32.5 kg/m2  SpO2 97% Physical Exam  Nursing note and vitals reviewed. Constitutional: He is oriented to person, place, and time. He appears well-developed and well-nourished.  Overweight  HENT:  Head: Normocephalic and atraumatic.  Eyes: Conjunctivae and EOM are normal. Pupils are equal, round, and reactive to light.  Neck: Normal range of  motion. Neck supple.  Cardiovascular: Normal rate, regular rhythm and normal heart sounds.   Pulmonary/Chest: Effort normal and breath sounds normal.  Abdominal: Soft. Bowel sounds are normal.  Musculoskeletal: Normal range of motion.  Neurological: He is alert and oriented to person, place, and time.  Skin: Skin is warm and dry.  Psychiatric: He has a normal mood and affect.    ED Course  Procedures (including critical care time) Labs Review Labs Reviewed  BASIC METABOLIC PANEL - Abnormal; Notable for the following:    Potassium 2.8 (*)    Glucose, Bld 102 (*)    Creatinine, Ser 2.99 (*)    GFR calc non Af Amer 24 (*)    GFR calc Af Amer 28 (*)    All other components within normal limits  CBC WITH DIFFERENTIAL - Abnormal; Notable for the following:    RBC 4.03 (*)    HCT 37.0 (*)    All other components within normal limits  TROPONIN I - Abnormal; Notable for the  following:    Troponin I 1.21 (*)    All other components within normal limits   Date: 08/09/2013  Rate: 93  Rhythm: normal sinus rhythm  QRS Axis: normal  Intervals: normal  ST/T Wave abnormalities: normal  Conduction Disutrbances: Prolonged QT  Narrative Interpretation: unremarkable    Imaging Review Dg Chest 2 View  08/09/2013   *RADIOLOGY REPORT*  Clinical Data: Pain  CHEST - 2 VIEW  Comparison: 10/30/2011  Findings: The heart size and pulmonary vascularity are normal. The lungs appear clear and expanded without focal air space disease or consolidation. No blunting of the costophrenic angles.  No pneumothorax.  Mediastinal contours appear intact.  Mild hyperinflation.  IMPRESSION: No evidence of active pulmonary disease.   Original Report Authenticated By: Lucienne Capers, M.D.  CRITICAL CARE Performed by: Nat Christen Total critical care time: 30 Critical care time was exclusive of separately billable procedures and treating other patients. Critical care was necessary to treat or prevent imminent or life-threatening deterioration. Critical care was time spent personally by me on the following activities: development of treatment plan with patient and/or surrogate as well as nursing, discussions with consultants, evaluation of patient's response to treatment, examination of patient, obtaining history from patient or surrogate, ordering and performing treatments and interventions, ordering and review of laboratory studies, ordering and review of radiographic studies, pulse oximetry and re-evaluation of patient's condition.  MDM  No diagnosis found.  Patient has a host of cardiac risk factors including hypertension, hypercholesterolemia, smoking, family history. EKG shows no ST segment changes. First troponin 1.21       Rx aspirin, IV beta blocker, IV heparin.   Discussed with cardiologist in [place]. Will transfer.   Nat Christen, MD 08/09/13 [place], MD 08/19/13 2029

## 2013-08-09 NOTE — Progress Notes (Signed)
ANTICOAGULATION CONSULT NOTE - Follow Consult  Pharmacy Consult for heparin Indication: chest pain/ACS  Allergies  Allergen Reactions  . Mobic [Meloxicam]     Unknown   . Tramadol     REACTION: itching, breaking out.   Patient Measurements: Height: 6\' 3"  (190.5 cm) Weight: 265 lb (120.203 kg) IBW/kg (Calculated) : 84.5 Heparin Dosing Weight: 110kg  Vital Signs: Temp: 98 F (36.7 C) (09/09 1440) Temp src: Oral (09/09 1440) BP: 134/76 mmHg (09/09 1440) Pulse Rate: 80 (09/09 1440)  Labs:  Recent Labs  08/09/13 0342 08/09/13 1000 08/09/13 1520 08/09/13 1843  HGB 13.2  --   --   --   HCT 37.0*  --   --   --   PLT 159  --   --   --   LABPROT  --   --  12.8  --   INR  --   --  0.98  --   HEPARINUNFRC  --  <0.10*  --  <0.10*  CREATININE 2.99*  --   --   --   TROPONINI 1.21* 1.51* 1.50*  --    Estimated Creatinine Clearance: 45.4 ml/min (by C-G formula based on Cr of 2.99).  Medical History: Past Medical History  Diagnosis Date  . Cellulitis   . Hypertension   . Nephrotic syndrome   . Hyperlipidemia   . Tobacco abuse   . Chronic headache     Assessment: 43 YOM who was transferred from [facility] with chest pain. EKG showed NSR with no ST/T changes. He does have a history of nephrotic syndrome, and his SCr currently it 2.99. Renal to be consulted regarding the timing of cath.  His heparin level tonight is undetectable (< 0.1) on an IV rate of 1400 units/hr.  He is sub-therapeutic without noted bleeding complications.  Goal of Therapy:  Heparin level 0.3-0.7 units/ml Monitor platelets by anticoagulation protocol: Yes   Plan:  1. Will increase IV heparin to rate of 1750units/hr ( ~ 16 units/kg Heparin dosing weight) 2. Will give additional bolus of 5000 units IV heparin  3. Obtain a heparin level 6-8 hours after bolus and rate change 4. Follow up timing of cath, renal function, and any signs of bleeding  Rober Minion, PharmD., MS Clinical Pharmacist Pager:   [phone] Thank you for allowing pharmacy to be part of this patients care team. 08/09/2013 7:55 PM

## 2013-08-10 ENCOUNTER — Encounter (HOSPITAL_COMMUNITY)
Admission: EM | Disposition: A | Discharge status: Home / Self Care | Payer: 59 | Source: Home / Self Care | Attending: Internal Medicine

## 2013-08-10 DIAGNOSIS — I2510 Atherosclerotic heart disease of native coronary artery without angina pectoris: Secondary | ICD-10-CM

## 2013-08-10 DIAGNOSIS — I517 Cardiomegaly: Secondary | ICD-10-CM

## 2013-08-10 HISTORY — PX: LEFT HEART CATHETERIZATION WITH CORONARY ANGIOGRAM: SHX5451

## 2013-08-10 LAB — HEPATIC FUNCTION PANEL
ALT: 12 U/L (ref 0–53)
AST: 20 U/L (ref 0–37)
Albumin: 2.2 g/dL — ABNORMAL LOW (ref 3.5–5.2)
Alkaline Phosphatase: 44 U/L (ref 39–117)
Bilirubin, Direct: <0.1 mg/dL (ref 0.0–0.3)
Total Bilirubin: 0.1 mg/dL — ABNORMAL LOW (ref 0.3–1.2)
Total Protein: 5.1 g/dL — ABNORMAL LOW (ref 6.0–8.3)

## 2013-08-10 LAB — CBC
HCT: 33.2 % — ABNORMAL LOW (ref 39.0–52.0)
Hemoglobin: 12.1 g/dL — ABNORMAL LOW (ref 13.0–17.0)
MCH: 33 pg (ref 26.0–34.0)
MCHC: 36.4 g/dL — ABNORMAL HIGH (ref 30.0–36.0)
MCV: 90.5 fL (ref 78.0–100.0)
Platelets: 145 10*3/uL — ABNORMAL LOW (ref 150–400)
RBC: 3.67 MIL/uL — ABNORMAL LOW (ref 4.22–5.81)
RDW: 13.1 % (ref 11.5–15.5)
WBC: 8.3 10*3/uL (ref 4.0–10.5)

## 2013-08-10 LAB — KAPPA/LAMBDA LIGHT CHAINS
Kappa free light chain: 2.81 mg/dL — ABNORMAL HIGH (ref 0.33–1.94)
Kappa, lambda light chain ratio: 1.32 (ref 0.26–1.65)
Lambda free light chains: 2.13 mg/dL (ref 0.57–2.63)

## 2013-08-10 LAB — PROTEIN / CREATININE RATIO, URINE
Creatinine, Urine: 68.31 mg/dL
Protein Creatinine Ratio: 5.66 — ABNORMAL HIGH (ref 0.00–0.15)
Total Protein, Urine: 386.5 mg/dL

## 2013-08-10 LAB — POCT ACTIVATED CLOTTING TIME: Activated Clotting Time: 150 seconds

## 2013-08-10 LAB — BASIC METABOLIC PANEL
BUN: 14 mg/dL (ref 6–23)
CO2: 20 mEq/L (ref 19–32)
Calcium: 8.8 mg/dL (ref 8.4–10.5)
Chloride: 109 mEq/L (ref 96–112)
Creatinine, Ser: 2.13 mg/dL — ABNORMAL HIGH (ref 0.50–1.35)
GFR calc Af Amer: 43 mL/min — ABNORMAL LOW (ref >90)
GFR calc non Af Amer: 37 mL/min — ABNORMAL LOW (ref >90)
Glucose, Bld: 80 mg/dL (ref 70–99)
Potassium: 3.3 mEq/L — ABNORMAL LOW (ref 3.5–5.1)
Sodium: 138 mEq/L (ref 135–145)

## 2013-08-10 LAB — TROPONIN I: Troponin I: 1.5 ng/mL (ref <0.30)

## 2013-08-10 LAB — LIPID PANEL
Cholesterol: 243 mg/dL — ABNORMAL HIGH (ref 0–200)
HDL: 24 mg/dL — ABNORMAL LOW (ref >39)
LDL Cholesterol: 142 mg/dL — ABNORMAL HIGH (ref 0–99)
Total CHOL/HDL Ratio: 10.1 RATIO
Triglycerides: 383 mg/dL — ABNORMAL HIGH (ref <150)
VLDL: 77 mg/dL — ABNORMAL HIGH (ref 0–40)

## 2013-08-10 LAB — HEPARIN LEVEL (UNFRACTIONATED): Heparin Unfractionated: 0.37 IU/mL (ref 0.30–0.70)

## 2013-08-10 LAB — ANA: Anti Nuclear Antibody(ANA): NEGATIVE

## 2013-08-10 LAB — ANTI-DNA ANTIBODY, DOUBLE-STRANDED: ds DNA Ab: 5 IU/mL (ref <30)

## 2013-08-10 SURGERY — LEFT HEART CATHETERIZATION WITH CORONARY ANGIOGRAM
Anesthesia: LOCAL

## 2013-08-10 MED ORDER — MIDAZOLAM HCL 2 MG/2ML IJ SOLN
INTRAMUSCULAR | Status: AC
  Filled 2013-08-10: qty 2

## 2013-08-10 MED ORDER — DOCUSATE SODIUM 100 MG PO CAPS
100.0000 mg | ORAL_CAPSULE | Freq: Two times a day (BID) | ORAL | Status: DC | PRN
  Administered 2013-08-10: 100 mg via ORAL
  Filled 2013-08-10 (×2): qty 1

## 2013-08-10 MED ORDER — MIDAZOLAM HCL 2 MG/2ML IJ SOLN
1.0000 mg | Freq: Once | INTRAMUSCULAR | Status: AC
  Administered 2013-08-10: 1 mg via INTRAVENOUS

## 2013-08-10 MED ORDER — PNEUMOCOCCAL VAC POLYVALENT 25 MCG/0.5ML IJ INJ
0.5000 mL | INJECTION | INTRAMUSCULAR | Status: AC
  Administered 2013-08-10: 0.5 mL via INTRAMUSCULAR
  Filled 2013-08-10: qty 0.5

## 2013-08-10 MED ORDER — HEPARIN (PORCINE) IN NACL 100-0.45 UNIT/ML-% IJ SOLN: 1750.0000 [IU]/h | INTRAMUSCULAR | Status: DC

## 2013-08-10 MED ORDER — INFLUENZA VAC SPLIT QUAD 0.5 ML IM SUSP
0.5000 mL | INTRAMUSCULAR | Status: AC
  Administered 2013-08-10: 0.5 mL via INTRAMUSCULAR
  Filled 2013-08-10 (×2): qty 0.5

## 2013-08-10 MED ORDER — MIDAZOLAM HCL 2 MG/2ML IJ SOLN
INTRAMUSCULAR | Status: AC
  Administered 2013-08-10: 1 mg via INTRAVENOUS
  Filled 2013-08-10: qty 2

## 2013-08-10 MED ORDER — POTASSIUM CHLORIDE CRYS ER 20 MEQ PO TBCR
40.0000 meq | EXTENDED_RELEASE_TABLET | Freq: Once | ORAL | Status: AC
  Administered 2013-08-10: 40 meq via ORAL
  Filled 2013-08-10: qty 2

## 2013-08-10 MED ORDER — CLOPIDOGREL BISULFATE 300 MG PO TABS
300.0000 mg | ORAL_TABLET | Freq: Once | ORAL | Status: AC
  Administered 2013-08-10: 300 mg via ORAL
  Filled 2013-08-10: qty 1

## 2013-08-10 MED ORDER — ONDANSETRON HCL 4 MG/2ML IJ SOLN: 4.0000 mg | Freq: Four times a day (QID) | INTRAMUSCULAR | Status: DC | PRN

## 2013-08-10 MED ORDER — HEPARIN (PORCINE) IN NACL 100-0.45 UNIT/ML-% IJ SOLN
2000.0000 [IU]/h | INTRAMUSCULAR | Status: DC
  Administered 2013-08-10: 1750 [IU]/h via INTRAVENOUS
  Administered 2013-08-11: 1850 [IU]/h via INTRAVENOUS
  Administered 2013-08-11: 2000 [IU]/h via INTRAVENOUS
  Filled 2013-08-10 (×3): qty 250

## 2013-08-10 MED ORDER — FENTANYL CITRATE 0.05 MG/ML IJ SOLN
INTRAMUSCULAR | Status: AC
  Filled 2013-08-10: qty 2

## 2013-08-10 MED ORDER — LIDOCAINE HCL (PF) 1 % IJ SOLN
INTRAMUSCULAR | Status: AC
  Filled 2013-08-10: qty 30

## 2013-08-10 MED ORDER — FENTANYL CITRATE 0.05 MG/ML IJ SOLN
25.0000 ug | Freq: Once | INTRAMUSCULAR | Status: AC
  Administered 2013-08-10: 25 ug via INTRAVENOUS

## 2013-08-10 MED ORDER — CLOPIDOGREL BISULFATE 75 MG PO TABS
75.0000 mg | ORAL_TABLET | Freq: Every day | ORAL | Status: DC
  Administered 2013-08-11 – 2013-08-12 (×2): 75 mg via ORAL
  Filled 2013-08-10 (×3): qty 1

## 2013-08-10 MED ORDER — NITROGLYCERIN 0.2 MG/ML ON CALL CATH LAB
INTRAVENOUS | Status: AC
  Filled 2013-08-10: qty 1

## 2013-08-10 MED ORDER — ACETAMINOPHEN 325 MG PO TABS: 650.0000 mg | ORAL_TABLET | ORAL | Status: DC | PRN

## 2013-08-10 MED ORDER — HEPARIN (PORCINE) IN NACL 2-0.9 UNIT/ML-% IJ SOLN
INTRAMUSCULAR | Status: AC
  Filled 2013-08-10: qty 1000

## 2013-08-10 MED ORDER — SODIUM CHLORIDE 0.9 % IV SOLN: INTRAVENOUS | Status: AC

## 2013-08-10 NOTE — CV Procedure (Addendum)
    Cardiac Cath Note  Wayne Spencer [id number] 13-Mar-1971  Procedure: Left  Heart Cardiac Catheterization Note Indications: NSTEMI  Procedure Details Consent: Obtained Time Out: Verified patient identification, verified procedure, site/side was marked, verified correct patient position, special equipment/implants available, Radiology Safety Procedures followed,  medications/allergies/relevent history reviewed, required imaging and test results available.  Performed   Medications: Fentanyl: 100 mcg IV Versed: 3 mg IV  The right femoral artery was easily canulated using a modified Seldinger technique.  Hemodynamics:    LV pressure: 156/25 Aortic pressure: 153/96  Angiography   Left Main: large normal  Left anterior Descending: large, mild - moderate irregularlites.  The 1st diag is a small - moderate sized vessel with a 90% stenosis in the proximal segment  Left Circumflex: moderate - large vessel.  Minor luminal irreg.  Right Coronary Artery: large and dominant.  Occluded before the bifurcation .  The distal RCA can be seen filling from collaterals from the left system   LV Gram: not performed.  The AV was crossed for pressures.   Complications: No apparent complications Patient did tolerate procedure well.  Contrast used: 36 cc contrast  Conclusions:   1. CAD : his RCA is occluded and fills via collaterals.  His 1st diag has a tight stenosis but is probably too small to attempt PCI given his tenuous renal status.  I have elected to not even pursue PCI today to allow contrast to wash out ( creatinine is 2.14 today, was 2.9 yesterday)   Will discuss with the interventional team.  Options include PCI of the RCA  vs. Medical therapy.  I suspect he occluded his RCA several weeks ago and had angina with exertion because of insufficient flow down the collaterals.    He has not had any further pain since being transferred from [facility] but also has not been ambulating.   He  needs aggressive risk factor modification ( smoking cessation, lipid management)   Will restart heparin in 8 hours.  [name], [name]., MD, [facility] 08/10/2013, 9:12 AM Office - [phone] Pager [phone]

## 2013-08-10 NOTE — Progress Notes (Signed)
PROGRESS NOTE  Subjective:   Wayne Spencer is a 42 yo with hx of unstable angina ( class 3) and chronic renal insufficiency.  He was transferred from [facility] for unstable angina and NSTEMI.  He has been seen by nephrology.  Dr. Serita Grit opinion is that his renal insufficiency is likely not reversible and that our best option is to proceed with cath after overnight hydration.   Objective:    Vital Signs:   Temp:  [97.8 F (36.6 C)-98 F (36.7 C)] 97.8 F (36.6 C) (09/10 0508) Pulse Rate:  [65-81] 81 (09/10 0508) Resp:  [18] 18 (09/10 0508) BP: (125-155)/(69-84) 155/84 mmHg (09/10 0508) SpO2:  [97 %-100 %] 100 % (09/10 0508) Weight:  [266 lb 4.8 oz (120.793 kg)] 266 lb 4.8 oz (120.793 kg) (09/10 0508)  Last BM Date: 08/08/13   24-hour weight change: Weight change: 6 lb 4.8 oz (2.858 kg)  Weight trends: Filed Weights   08/09/13 0328 08/09/13 0647 08/10/13 0508  Weight: 260 lb (117.935 kg) 265 lb (120.203 kg) 266 lb 4.8 oz (120.793 kg)    Intake/Output:  09/09 0701 - 09/10 0700 In: 1122.4 [P.O.:480; I.V.:642.4] Out: 200 [Urine:200]     Physical Exam: BP 155/84  Pulse 81  Temp(Src) 97.8 F (36.6 C) (Oral)  Resp 18  Ht 6\' 3"  (1.905 m)  Wt 266 lb 4.8 oz (120.793 kg)  BMI 33.29 kg/m2  SpO2 100%  General: Vital signs reviewed and noted.  Head: Normocephalic, atraumatic.  Eyes: conjunctivae/corneas clear.  EOM's intact.   Throat: normal  Neck:  normal  Lungs:    clear  Heart:  RR, normal S1, S2  Abdomen:  Soft, non-tender, non-distended    Extremities: No edema   Neurologic: A&O X3, CN II - XII are grossly intact.   Psych: Normal    Labs: BMET:  Recent Labs  08/09/13 0342 08/09/13 1000 08/10/13 0435  NA 136  --  138  K 2.8*  --  3.3*  CL 101  --  109  CO2 26  --  20  GLUCOSE 102*  --  80  BUN 13  --  14  CREATININE 2.99*  --  2.13*  CALCIUM 8.9  --  8.8  MG  --  1.6  --     Liver function tests:  Recent Labs  08/10/13 0435  AST 20    ALT 12  ALKPHOS 44  BILITOT 0.1*  PROT 5.1*  ALBUMIN 2.2*   No results found for this basename: LIPASE, AMYLASE,  in the last 72 hours  CBC:  Recent Labs  08/09/13 0342 08/10/13 0435  WBC 8.4 8.3  NEUTROABS 4.3  --   HGB 13.2 12.1*  HCT 37.0* 33.2*  MCV 91.8 90.5  PLT 159 145*    Cardiac Enzymes:  Recent Labs  08/09/13 0342 08/09/13 1000 08/09/13 1520  TROPONINI 1.21* 1.51* 1.50*    Coagulation Studies:  Recent Labs  08/09/13 1520  LABPROT 12.8  INR 0.98    Other: No components found with this basename: POCBNP,  No results found for this basename: DDIMER,  in the last 72 hours  Recent Labs  08/09/13 1000  HGBA1C 5.4    Recent Labs  08/10/13 0435  CHOL 243*  HDL 24*  LDLCALC 142*  TRIG 383*  CHOLHDL 10.1    Recent Labs  08/09/13 1000  TSH 4.001   No results found for this basename: VITAMINB12, FOLATE, FERRITIN, TIBC, IRON, RETICCTPCT,  in the last 72  hours   Other results: Tele:  NSR  Medications:    Infusions: . sodium chloride 100 mL/hr at 08/10/13 0235  . heparin 1,750 Units/hr (08/10/13 0646)    Scheduled Medications: . aspirin EC  81 mg Oral Daily  . atorvastatin  80 mg Oral q1800  . influenza vac split quadrivalent PF  0.5 mL Intramuscular Tomorrow-1000  . metoprolol tartrate  25 mg Oral BID  . pneumococcal 23 valent vaccine  0.5 mL Intramuscular Tomorrow-1000  . potassium chloride SA  20 mEq Oral Daily  . sodium chloride  3 mL Intravenous Q12H  . sodium chloride  3 mL Intravenous Q12H    Assessment/ Plan:    1. NSTEMI:  Patient presents with classic unstable angina - class 3.  He needs a cath.  Fortunately, his creatinine has improved slightly overnight with hydration.   He understands the risk involved with cath - including the risk of renal failure and need for dialysis.  As I told him yesterday, he will likely need to have dialysis at some point anyway.    Disposition:  Length of Stay: 1  [name],  [name]., MD, [facility] 08/10/2013, 8:20 AM Office [phone] Pager [phone]

## 2013-08-10 NOTE — Progress Notes (Signed)
I have personally seen and examined this patient and agree with the assessment/plan as outlined above by Wendi Snipes MD (PGY2). Pernell Dikes K.,MD 08/10/2013 2:50 PM

## 2013-08-10 NOTE — Progress Notes (Signed)
Echocardiogram 2D Echocardiogram has been performed.  Wayne Spencer 08/10/2013, 4:58 PM

## 2013-08-10 NOTE — Progress Notes (Signed)
ANTICOAGULATION CONSULT NOTE - Follow Consult  Pharmacy Consult for heparin Indication: chest pain/ACS  Allergies  Allergen Reactions  . Mobic [Meloxicam]     Unknown   . Tramadol     REACTION: itching, breaking out.   Patient Measurements: Height: 6\' 3"  (190.5 cm) Weight: 266 lb 4.8 oz (120.793 kg) IBW/kg (Calculated) : 84.5 Heparin Dosing Weight: 110kg  Vital Signs: Temp: 97.6 F (36.4 C) (09/10 1035) Temp src: Oral (09/10 1035) BP: 140/88 mmHg (09/10 1035) Pulse Rate: 75 (09/10 0838)  Labs:  Recent Labs  08/09/13 0342 08/09/13 1000 08/09/13 1520 08/09/13 1843 08/10/13 0435  HGB 13.2  --   --   --  12.1*  HCT 37.0*  --   --   --  33.2*  PLT 159  --   --   --  145*  LABPROT  --   --  12.8  --   --   INR  --   --  0.98  --   --   HEPARINUNFRC  --  <0.10*  --  <0.10* 0.37  CREATININE 2.99*  --   --   --  2.13*  TROPONINI 1.21* 1.51* 1.50*  --   --    Estimated Creatinine Clearance: 63.9 ml/min (by C-G formula based on Cr of 2.13).  Assessment: 8 YOM who was transferred from [facility] with chest pain. He is now s/p cath and heparin is to be restarted 8 hours post sheath removal in anticipation of repeat cath tomorrow. Sheath pulled at 0950. First 2 heparin levels were subtherapeutic. His heparin rate was increased to 1750units/hr last evening and a HL drawn before cath this morning was therapeutic at 0.37. CBC decreased slightly, no bleeding noted.  Goal of Therapy:  Heparin level 0.3-0.7 units/ml Monitor platelets by anticoagulation protocol: Yes   Plan:  1. Restart heparin at rate of 1750units/hr at 1800 this evening 2. Heparin level 6 hours after restart of gtt (midnight) 3. Daily HL and CBC 4. Follow up timing of repeat cath, renal function, and any signs of bleeding  Emely Fahy D. Kelin Borum, PharmD Clinical Pharmacist Pager: [phone] 08/10/2013 10:51 AM

## 2013-08-10 NOTE — Progress Notes (Signed)
Subjective: No new complaints. Notes one episode overnight of chest pain. Going to cath now  Objective: Vital signs in last 24 hours: Temp:  [97.8 F (36.6 C)-98 F (36.7 C)] 97.8 F (36.6 C) (09/10 0508) Pulse Rate:  [65-81] 81 (09/10 0508) Resp:  [18] 18 (09/10 0508) BP: (125-155)/(69-84) 155/84 mmHg (09/10 0508) SpO2:  [97 %-100 %] 100 % (09/10 0508) Weight:  [266 lb 4.8 oz (120.793 kg)] 266 lb 4.8 oz (120.793 kg) (09/10 0508) Weight change: 6 lb 4.8 oz (2.858 kg)  Intake/Output from previous day: 09/09 0701 - 09/10 0700 In: 1122.4 [P.O.:480; I.V.:642.4] Out: 200 [Urine:200] Intake/Output this shift:   Gen: NAD, alert, cooperative with exam  HEENT: NCAT CV: RRR, good S1/S2, no murmur  Resp: CTABL, no wheezes, non-labored  Abd: SNTND, BS present, no guarding or organomegaly, obese, limited by habitus  Ext: Trace pitting Edema of BL LE Neuro: Alert and oriented, No gross deficits     Lab Results:  Recent Labs  08/09/13 0342 08/10/13 0435  WBC 8.4 8.3  HGB 13.2 12.1*  HCT 37.0* 33.2*  PLT 159 145*   BMET:  Recent Labs  08/09/13 0342 08/10/13 0435  NA 136 138  K 2.8* 3.3*  CL 101 109  CO2 26 20  GLUCOSE 102* 80  BUN 13 14  CREATININE 2.99* 2.13*  CALCIUM 8.9 8.8   No results found for this basename: PTH,  in the last 72 hours Iron Studies: No results found for this basename: IRON, TIBC, TRANSFERRIN, FERRITIN,  in the last 72 hours CBG (last 3)  No results found for this basename: GLUCAP,  in the last 72 hours   Studies/Results: Dg Chest 2 View  08/09/2013   *RADIOLOGY REPORT*  Clinical Data: Pain  CHEST - 2 VIEW  Comparison: 10/30/2011  Findings: The heart size and pulmonary vascularity are normal. The lungs appear clear and expanded without focal air space disease or consolidation. No blunting of the costophrenic angles.  No pneumothorax.  Mediastinal contours appear intact.  Mild hyperinflation.  IMPRESSION: No evidence of active pulmonary disease.    Original Report Authenticated By: Lucienne Capers, M.D.    I have reviewed the patient's current medications.  Assessment/Plan:  1 NSTEMI- Per cardiology  - Going for cath this am, has been pre-treated with NS at 100 for 6 hours, would continue at 75 mL/hr for 12 hours after, continue to hold Ace and Lasix - Pt heparinized, on BB, Statin, and ASA  - holding Acei for possible AKI on CKD.  - Risk of Contrast induced nephropathy is 10%, risk of need of HD post cath is 1% based on use of 50 mL contrast according to the Mehran Simple Risk score   2. CKD stage 4, possible AKI on CKD, Nephrotic syndrome  - Pre-treat, hold Lasix and Acei before contrast as above  - Cre 2.6 in 03/2013,  so not far from baseline - 25 grams protein in 24 hour urine in 01/2010, Urine protein Creatinine ratio pending - Will need Ace or ARB after this acute phase  - Avoid NSAIDs  - follow daily BMP   3. Hypokalemia - improved - from renal loss due to Lasix use, replace   4. Hypertension: continue BB per cardiology, eventual Ace as above     LOS: 1 day   Kenn File 08/10/2013,8:19 AM

## 2013-08-11 ENCOUNTER — Encounter (HOSPITAL_COMMUNITY): Payer: Self-pay | Admitting: Nurse Practitioner

## 2013-08-11 ENCOUNTER — Encounter (HOSPITAL_COMMUNITY)
Admission: EM | Disposition: A | Discharge status: Home / Self Care | Payer: Self-pay | Source: Home / Self Care | Attending: Internal Medicine

## 2013-08-11 DIAGNOSIS — N049 Nephrotic syndrome with unspecified morphologic changes: Secondary | ICD-10-CM | POA: Diagnosis present

## 2013-08-11 DIAGNOSIS — E1169 Type 2 diabetes mellitus with other specified complication: Secondary | ICD-10-CM | POA: Diagnosis present

## 2013-08-11 DIAGNOSIS — I2510 Atherosclerotic heart disease of native coronary artery without angina pectoris: Secondary | ICD-10-CM | POA: Diagnosis present

## 2013-08-11 DIAGNOSIS — I10 Essential (primary) hypertension: Secondary | ICD-10-CM | POA: Diagnosis present

## 2013-08-11 DIAGNOSIS — E876 Hypokalemia: Secondary | ICD-10-CM | POA: Diagnosis present

## 2013-08-11 DIAGNOSIS — E785 Hyperlipidemia, unspecified: Secondary | ICD-10-CM | POA: Diagnosis present

## 2013-08-11 DIAGNOSIS — Z720 Tobacco use: Secondary | ICD-10-CM | POA: Diagnosis present

## 2013-08-11 DIAGNOSIS — N184 Chronic kidney disease, stage 4 (severe): Secondary | ICD-10-CM | POA: Diagnosis present

## 2013-08-11 HISTORY — PX: PERCUTANEOUS CORONARY STENT INTERVENTION (PCI-S): SHX5485

## 2013-08-11 LAB — CBC
HCT: 34.6 % — ABNORMAL LOW (ref 39.0–52.0)
HCT: 34 % — ABNORMAL LOW (ref 39.0–52.0)
Hemoglobin: 12.4 g/dL — ABNORMAL LOW (ref 13.0–17.0)
Hemoglobin: 12.3 g/dL — ABNORMAL LOW (ref 13.0–17.0)
MCH: 32.6 pg (ref 26.0–34.0)
MCH: 33.1 pg (ref 26.0–34.0)
MCHC: 35.8 g/dL (ref 30.0–36.0)
MCHC: 36.2 g/dL — ABNORMAL HIGH (ref 30.0–36.0)
MCV: 91.1 fL (ref 78.0–100.0)
MCV: 91.4 fL (ref 78.0–100.0)
Platelets: 146 10*3/uL — ABNORMAL LOW (ref 150–400)
Platelets: 156 10*3/uL (ref 150–400)
RBC: 3.8 MIL/uL — ABNORMAL LOW (ref 4.22–5.81)
RBC: 3.72 MIL/uL — ABNORMAL LOW (ref 4.22–5.81)
RDW: 13.2 % (ref 11.5–15.5)
RDW: 13.2 % (ref 11.5–15.5)
WBC: 8.7 10*3/uL (ref 4.0–10.5)
WBC: 8 10*3/uL (ref 4.0–10.5)

## 2013-08-11 LAB — BASIC METABOLIC PANEL
BUN: 11 mg/dL (ref 6–23)
CO2: 22 mEq/L (ref 19–32)
Calcium: 8.9 mg/dL (ref 8.4–10.5)
Chloride: 109 mEq/L (ref 96–112)
Creatinine, Ser: 1.87 mg/dL — ABNORMAL HIGH (ref 0.50–1.35)
GFR calc Af Amer: 50 mL/min — ABNORMAL LOW (ref >90)
GFR calc non Af Amer: 43 mL/min — ABNORMAL LOW (ref >90)
Glucose, Bld: 88 mg/dL (ref 70–99)
Potassium: 3 mEq/L — ABNORMAL LOW (ref 3.5–5.1)
Sodium: 140 mEq/L (ref 135–145)

## 2013-08-11 LAB — PROTEIN ELECTROPHORESIS, SERUM
Albumin ELP: 55.3 % — ABNORMAL LOW (ref 55.8–66.1)
Alpha-1-Globulin: 6.6 % — ABNORMAL HIGH (ref 2.9–4.9)
Alpha-2-Globulin: 13.2 % — ABNORMAL HIGH (ref 7.1–11.8)
Beta 2: 4.9 % (ref 3.2–6.5)
Beta Globulin: 8.6 % — ABNORMAL HIGH (ref 4.7–7.2)
Gamma Globulin: 11.4 % (ref 11.1–18.8)
M-Spike, %: NOT DETECTED g/dL
Total Protein ELP: 4.6 g/dL — ABNORMAL LOW (ref 6.0–8.3)

## 2013-08-11 LAB — HEPARIN LEVEL (UNFRACTIONATED)
Heparin Unfractionated: 0.18 IU/mL — ABNORMAL LOW (ref 0.30–0.70)
Heparin Unfractionated: 0.26 IU/mL — ABNORMAL LOW (ref 0.30–0.70)

## 2013-08-11 LAB — POCT ACTIVATED CLOTTING TIME: Activated Clotting Time: 314 seconds

## 2013-08-11 SURGERY — PERCUTANEOUS CORONARY STENT INTERVENTION (PCI-S)
Anesthesia: LOCAL

## 2013-08-11 MED ORDER — HYDRALAZINE HCL 20 MG/ML IJ SOLN
10.0000 mg | INTRAMUSCULAR | Status: DC | PRN
  Administered 2013-08-11 – 2013-08-12 (×2): 10 mg via INTRAVENOUS
  Filled 2013-08-11 (×2): qty 1

## 2013-08-11 MED ORDER — NITROGLYCERIN 0.2 MG/ML ON CALL CATH LAB
INTRAVENOUS | Status: AC
  Filled 2013-08-11: qty 1

## 2013-08-11 MED ORDER — VERAPAMIL HCL 2.5 MG/ML IV SOLN
INTRAVENOUS | Status: AC
  Filled 2013-08-11: qty 2

## 2013-08-11 MED ORDER — EPTIFIBATIDE 75 MG/100ML IV SOLN: 1.0000 ug/kg/min | INTRAVENOUS | Status: DC

## 2013-08-11 MED ORDER — SODIUM CHLORIDE 0.9 % IV SOLN
INTRAVENOUS | Status: DC
  Administered 2013-08-11: 11:00:00 via INTRAVENOUS

## 2013-08-11 MED ORDER — MIDAZOLAM HCL 2 MG/2ML IJ SOLN
INTRAMUSCULAR | Status: AC
  Filled 2013-08-11: qty 2

## 2013-08-11 MED ORDER — EPTIFIBATIDE 75 MG/100ML IV SOLN
INTRAVENOUS | Status: AC
  Administered 2013-08-12: 2.002 ug/kg/min via INTRAVENOUS
  Filled 2013-08-11: qty 100

## 2013-08-11 MED ORDER — SODIUM CHLORIDE 0.9 % IV SOLN
0.2500 mg/kg/h | Freq: Once | INTRAVENOUS | Status: DC
  Filled 2013-08-11 (×2): qty 250

## 2013-08-11 MED ORDER — HEPARIN (PORCINE) IN NACL 2-0.9 UNIT/ML-% IJ SOLN
INTRAMUSCULAR | Status: AC
  Filled 2013-08-11: qty 1000

## 2013-08-11 MED ORDER — POTASSIUM CHLORIDE CRYS ER 20 MEQ PO TBCR: 40.0000 meq | EXTENDED_RELEASE_TABLET | Freq: Once | ORAL | Status: DC

## 2013-08-11 MED ORDER — SODIUM CHLORIDE 0.9 % IJ SOLN: 3.0000 mL | INTRAMUSCULAR | Status: DC | PRN

## 2013-08-11 MED ORDER — EPTIFIBATIDE 75 MG/100ML IV SOLN
2.0000 ug/kg/min | INTRAVENOUS | Status: AC
  Administered 2013-08-11 (×3): 2 ug/kg/min via INTRAVENOUS
  Administered 2013-08-12: 2.002 ug/kg/min via INTRAVENOUS
  Filled 2013-08-11 (×3): qty 100

## 2013-08-11 MED ORDER — SODIUM CHLORIDE 0.9 % IJ SOLN: 3.0000 mL | Freq: Two times a day (BID) | INTRAMUSCULAR | Status: DC

## 2013-08-11 MED ORDER — SODIUM CHLORIDE 0.9 % IV SOLN
1.0000 mL/kg/h | INTRAVENOUS | Status: AC
  Administered 2013-08-11 (×2): 1 mL/kg/h via INTRAVENOUS

## 2013-08-11 MED ORDER — LIDOCAINE HCL (PF) 1 % IJ SOLN
INTRAMUSCULAR | Status: AC
  Filled 2013-08-11: qty 30

## 2013-08-11 MED ORDER — HEART ATTACK BOUNCING BOOK
Freq: Once | Status: AC
  Administered 2013-08-12: 04:00:00
  Filled 2013-08-11: qty 1

## 2013-08-11 MED ORDER — HEPARIN SODIUM (PORCINE) 1000 UNIT/ML IJ SOLN
INTRAMUSCULAR | Status: AC
  Filled 2013-08-11: qty 1

## 2013-08-11 MED ORDER — HYDROCODONE-ACETAMINOPHEN 7.5-325 MG PO TABS
1.0000 | ORAL_TABLET | ORAL | Status: DC | PRN
  Administered 2013-08-11 – 2013-08-12 (×2): 1 via ORAL
  Filled 2013-08-11 (×2): qty 1

## 2013-08-11 MED ORDER — SODIUM CHLORIDE 0.9 % IV SOLN: 250.0000 mL | INTRAVENOUS | Status: DC | PRN

## 2013-08-11 MED ORDER — FENTANYL CITRATE 0.05 MG/ML IJ SOLN
INTRAMUSCULAR | Status: AC
  Filled 2013-08-11: qty 2

## 2013-08-11 MED ORDER — POTASSIUM CHLORIDE CRYS ER 20 MEQ PO TBCR
40.0000 meq | EXTENDED_RELEASE_TABLET | Freq: Once | ORAL | Status: AC
  Administered 2013-08-11: 40 meq via ORAL
  Filled 2013-08-11: qty 2

## 2013-08-11 MED ORDER — ACETAMINOPHEN 325 MG PO TABS: 650.0000 mg | ORAL_TABLET | ORAL | Status: DC | PRN

## 2013-08-11 NOTE — CV Procedure (Signed)
   CARDIAC CATH NOTE  Name: Wayne Spencer MRN: [id number] DOB: 07-15-1971  Procedure: PTCA and stenting of the RCA  Indication: NSTEMI. 42 YO male presenting with crescendo angina, CCS Class 4 with positive enzymes. Found to have subtotal RCA with TIMI-1 flow. PCI staged for today because of CKD.  Procedural Details: The right wrist was prepped, draped, and anesthetized with 1% lidocaine. Using the modified Seldinger technique, a 6 Fr sheath was introduced into the radial artery. 3 mg verapamil was administered through the radial sheath. Weight-based bivalirudin was given for anticoagulation. Once a therapeutic ACT was achieved, a 6 [place] JR4 guide catheter was inserted.  A BMW coronary guidewire was used to cross the lesion.  The lesion was predilated with a 2.0 balloon.  There was a heavy thrombus burden. Integrelin was given intravenously. The lesion was dilated again with a 3.0 balloon. The lesion was then stented with a 4.0x38 mm Promus Premier drug-eluting stent.  The stent was postdilated with a 4.5 mm noncompliant balloon.  Following PCI, there was 0% residual stenosis and TIMI-3 flow. There was minor residual thrombus off the distal edge of the stent. Final angiography confirmed an excellent result. The patient tolerated the procedure well. There were no immediate procedural complications. A TR band was used for radial hemostasis. The patient was transferred to the post catheterization recovery area for further monitoring.  Lesion Data: Vessel: RCA/distal Percent stenosis (pre): 99 TIMI-flow (pre):  1 Stent:  4.0x38 mm DES Percent stenosis (post): 0 TIMI-flow (post): 3  Conclusions: Successful PCI of the distal RCA  Recommendations: ASA/plavix at least 12 months. Integrelin 12 hours.  [name] 08/11/2013, 3:14 PM

## 2013-08-11 NOTE — H&P (View-Only) (Signed)
PROGRESS NOTE  Subjective:   Wayne Spencer is a 42 yo with hx of unstable angina ( class 3) and chronic renal insufficiency.  He was transferred from [facility] for unstable angina and NSTEMI.  He has been seen by nephrology.  Cath on 9/10 showed an occluded RCA and a tight stenosis in a small diag.  Objective:    Vital Signs:   Temp:  [97.6 F (36.4 C)-98.4 F (36.9 C)] 98.4 F (36.9 C) (09/11 0542) Pulse Rate:  [71-93] 93 (09/11 0542) Resp:  [16-18] 16 (09/11 0542) BP: (138-158)/(76-95) 158/93 mmHg (09/11 0542) SpO2:  [97 %-99 %] 99 % (09/11 0542) Weight:  [260 lb 2.3 oz (118 kg)] 260 lb 2.3 oz (118 kg) (09/11 0542)  Last BM Date: 08/08/13   24-hour weight change: Weight change: -6 lb 2.5 oz (-2.793 kg)  Weight trends: Filed Weights   08/09/13 0647 08/10/13 0508 08/11/13 0542  Weight: 265 lb (120.203 kg) 266 lb 4.8 oz (120.793 kg) 260 lb 2.3 oz (118 kg)    Intake/Output:  09/10 0701 - 09/11 0700 In: 240 [P.O.:240] Out: 2650 [Urine:2650]     Physical Exam: BP 158/93  Pulse 93  Temp(Src) 98.4 F (36.9 C) (Oral)  Resp 16  Ht 6\' 3"  (1.905 m)  Wt 260 lb 2.3 oz (118 kg)  BMI 32.52 kg/m2  SpO2 99%  General: Vital signs reviewed and noted.  Head: Normocephalic, atraumatic.  Eyes: conjunctivae/corneas clear.  EOM's intact.   Throat: normal  Neck:  normal  Lungs:   clear  Heart:  RR, normal S1, S2  Abdomen:  Soft, non-tender, non-distended    Extremities: No edema . Cath site is ok.    Neurologic: A&O X3, CN II - XII are grossly intact.   Psych: Normal    Labs: BMET:  Recent Labs  08/09/13 1000 08/10/13 0435 08/11/13 0710  NA  --  138 140  K  --  3.3* 3.0*  CL  --  109 109  CO2  --  20 22  GLUCOSE  --  80 88  BUN  --  14 11  CREATININE  --  2.13* 1.87*  CALCIUM  --  8.8 8.9  MG 1.6  --   --     Liver function tests:  Recent Labs  08/10/13 0435  AST 20  ALT 12  ALKPHOS 44  BILITOT 0.1*  PROT 5.1*  ALBUMIN 2.2*   No results found  for this basename: LIPASE, AMYLASE,  in the last 72 hours  CBC:  Recent Labs  08/09/13 0342 08/10/13 0435 08/11/13 0710  WBC 8.4 8.3 8.7  NEUTROABS 4.3  --   --   HGB 13.2 12.1* 12.4*  HCT 37.0* 33.2* 34.6*  MCV 91.8 90.5 91.1  PLT 159 145* 146*    Cardiac Enzymes:  Recent Labs  08/09/13 0342 08/09/13 1000 08/09/13 1520  TROPONINI 1.21* 1.51* 1.50*    Coagulation Studies:  Recent Labs  08/09/13 1520  LABPROT 12.8  INR 0.98    Other: No components found with this basename: POCBNP,  No results found for this basename: DDIMER,  in the last 72 hours  Recent Labs  08/09/13 1000  HGBA1C 5.4    Recent Labs  08/10/13 0435  CHOL 243*  HDL 24*  LDLCALC 142*  TRIG 383*  CHOLHDL 10.1    Recent Labs  08/09/13 1000  TSH 4.001   No results found for this basename: VITAMINB12, FOLATE, FERRITIN, TIBC, IRON, RETICCTPCT,  in the last 72 hours   Other results: Tele:  NSR  Medications:    Infusions: . sodium chloride    . heparin 2,000 Units/hr (08/11/13 [id number])    Scheduled Medications: . aspirin EC  81 mg Oral Daily  . atorvastatin  80 mg Oral q1800  . clopidogrel  75 mg Oral Q breakfast  . metoprolol tartrate  25 mg Oral BID  . potassium chloride SA  20 mEq Oral Daily  . potassium chloride  40 mEq Oral Once  . potassium chloride  40 mEq Oral Once  . sodium chloride  3 mL Intravenous Q12H  . sodium chloride  3 mL Intravenous Q12H    Assessment/ Plan:    1. NSTEMI:  Patient presents with classic unstable angina -  He has an occluded RCA that fills via collaterals.  I suspect the collateral flow is not adequate.  2. Chronic kidney disease:  Creatinine has improved on IV hydration.    3. Hyperlipidemia:  On Lipitor 80 daily.    Disposition:  Length of Stay: 2  [name], [name]., MD, [facility] 08/11/2013, 10:13 AM Office [phone] Pager [phone]

## 2013-08-11 NOTE — Progress Notes (Signed)
ANTICOAGULATION CONSULT NOTE - Follow Consult  Pharmacy Consult for heparin Indication: chest pain/ACS  Allergies  Allergen Reactions  . Mobic [Meloxicam]     Unknown   . Tramadol     REACTION: itching, breaking out.   Patient Measurements: Height: 6\' 3"  (190.5 cm) Weight: 260 lb 2.3 oz (118 kg) IBW/kg (Calculated) : 84.5 Heparin Dosing Weight: 110kg  Vital Signs: Temp: 98.4 F (36.9 C) (09/11 0542) Temp src: Oral (09/11 0542) BP: 158/93 mmHg (09/11 0542) Pulse Rate: 93 (09/11 0542)  Labs:  Recent Labs  08/09/13 0342 08/09/13 1000 08/09/13 1520  08/10/13 0435 08/11/13 0002 08/11/13 0710  HGB 13.2  --   --   --  12.1*  --  12.4*  HCT 37.0*  --   --   --  33.2*  --  34.6*  PLT 159  --   --   --  145*  --  146*  LABPROT  --   --  12.8  --   --   --   --   INR  --   --  0.98  --   --   --   --   HEPARINUNFRC  --  <0.10*  --   < > 0.37 0.18* 0.26*  CREATININE 2.99*  --   --   --  2.13*  --  1.87*  TROPONINI 1.21* 1.51* 1.50*  --   --   --   --   < > = values in this interval not displayed. Estimated Creatinine Clearance: 72 ml/min (by C-G formula based on Cr of 1.87).  Assessment: Wayne Spencer who was transferred from [facility] with chest pain. He is now s/p cath and heparin was to be restarted. Going back to cath today. His first heparin level after restarting was low and the rate was increased to 1850units/hr. Another level this morning was still below desired level at 0.26units/mL. No issues with line or gtt held for any reason per RN. No bleeding noted. CBC is stable.  Goal of Therapy:  Heparin level 0.3-0.7 units/ml Monitor platelets by anticoagulation protocol: Yes   Plan:  1. Increase heparin gtt to 2000units/hr 2. Heparin level 6 hours if able to obtain before cath 3. Daily HL and CBC 4. Follow up plans after cath   Cloie Wooden D. Morgane Joerger, PharmD Clinical Pharmacist Pager: [phone] 08/11/2013 8:22 AM

## 2013-08-11 NOTE — Interval H&P Note (Signed)
History and Physical Interval Note:  08/11/2013 1:46 PM  Wayne Spencer  has presented today for surgery, with the diagnosis of cp  The various methods of treatment have been discussed with the patient and family. After consideration of risks, benefits and other options for treatment, the patient has consented to  Procedure(s): PERCUTANEOUS CORONARY STENT INTERVENTION (PCI-S) (N/A) as a surgical intervention .  The patient's history has been reviewed, patient examined, no change in status, stable for surgery.  I have reviewed the patient's chart and labs.  Questions were answered to the patient's satisfaction.    Cath Lab Visit (complete for each Cath Lab visit)  Clinical Evaluation Leading to the Procedure:   ACS: yes  Non-ACS:    Anginal Classification: CCS IV  Anti-ischemic medical therapy: Minimal Therapy (1 class of medications)  Non-Invasive Test Results: No non-invasive testing performed  Prior CABG: No previous CABG         [name]

## 2013-08-11 NOTE — Progress Notes (Signed)
PROGRESS NOTE  Subjective:   Wayne Spencer is a 42 yo with hx of unstable angina ( class 3) and chronic renal insufficiency.  He was transferred from [facility] for unstable angina and NSTEMI.  He has been seen by nephrology.  Cath on 9/10 showed an occluded RCA and a tight stenosis in a small diag.  Objective:    Vital Signs:   Temp:  [97.6 F (36.4 C)-98.4 F (36.9 C)] 98.4 F (36.9 C) (09/11 0542) Pulse Rate:  [71-93] 93 (09/11 0542) Resp:  [16-18] 16 (09/11 0542) BP: (138-158)/(76-95) 158/93 mmHg (09/11 0542) SpO2:  [97 %-99 %] 99 % (09/11 0542) Weight:  [260 lb 2.3 oz (118 kg)] 260 lb 2.3 oz (118 kg) (09/11 0542)  Last BM Date: 08/08/13   24-hour weight change: Weight change: -6 lb 2.5 oz (-2.793 kg)  Weight trends: Filed Weights   08/09/13 0647 08/10/13 0508 08/11/13 0542  Weight: 265 lb (120.203 kg) 266 lb 4.8 oz (120.793 kg) 260 lb 2.3 oz (118 kg)    Intake/Output:  09/10 0701 - 09/11 0700 In: 240 [P.O.:240] Out: 2650 [Urine:2650]     Physical Exam: BP 158/93  Pulse 93  Temp(Src) 98.4 F (36.9 C) (Oral)  Resp 16  Ht 6\' 3"  (1.905 m)  Wt 260 lb 2.3 oz (118 kg)  BMI 32.52 kg/m2  SpO2 99%  General: Vital signs reviewed and noted.  Head: Normocephalic, atraumatic.  Eyes: conjunctivae/corneas clear.  EOM's intact.   Throat: normal  Neck:  normal  Lungs:   clear  Heart:  RR, normal S1, S2  Abdomen:  Soft, non-tender, non-distended    Extremities: No edema . Cath site is ok.    Neurologic: A&O X3, CN II - XII are grossly intact.   Psych: Normal    Labs: BMET:  Recent Labs  08/09/13 1000 08/10/13 0435 08/11/13 0710  NA  --  138 140  K  --  3.3* 3.0*  CL  --  109 109  CO2  --  20 22  GLUCOSE  --  80 88  BUN  --  14 11  CREATININE  --  2.13* 1.87*  CALCIUM  --  8.8 8.9  MG 1.6  --   --     Liver function tests:  Recent Labs  08/10/13 0435  AST 20  ALT 12  ALKPHOS 44  BILITOT 0.1*  PROT 5.1*  ALBUMIN 2.2*   No results found  for this basename: LIPASE, AMYLASE,  in the last 72 hours  CBC:  Recent Labs  08/09/13 0342 08/10/13 0435 08/11/13 0710  WBC 8.4 8.3 8.7  NEUTROABS 4.3  --   --   HGB 13.2 12.1* 12.4*  HCT 37.0* 33.2* 34.6*  MCV 91.8 90.5 91.1  PLT 159 145* 146*    Cardiac Enzymes:  Recent Labs  08/09/13 0342 08/09/13 1000 08/09/13 1520  TROPONINI 1.21* 1.51* 1.50*    Coagulation Studies:  Recent Labs  08/09/13 1520  LABPROT 12.8  INR 0.98    Other: No components found with this basename: POCBNP,  No results found for this basename: DDIMER,  in the last 72 hours  Recent Labs  08/09/13 1000  HGBA1C 5.4    Recent Labs  08/10/13 0435  CHOL 243*  HDL 24*  LDLCALC 142*  TRIG 383*  CHOLHDL 10.1    Recent Labs  08/09/13 1000  TSH 4.001   No results found for this basename: VITAMINB12, FOLATE, FERRITIN, TIBC, IRON, RETICCTPCT,  in the last 72 hours   Other results: Tele:  NSR  Medications:    Infusions: . sodium chloride    . heparin 2,000 Units/hr (08/11/13 [id number])    Scheduled Medications: . aspirin EC  81 mg Oral Daily  . atorvastatin  80 mg Oral q1800  . clopidogrel  75 mg Oral Q breakfast  . metoprolol tartrate  25 mg Oral BID  . potassium chloride SA  20 mEq Oral Daily  . potassium chloride  40 mEq Oral Once  . potassium chloride  40 mEq Oral Once  . sodium chloride  3 mL Intravenous Q12H  . sodium chloride  3 mL Intravenous Q12H    Assessment/ Plan:    1. NSTEMI:  Patient presents with classic unstable angina -  He has an occluded RCA that fills via collaterals.  I suspect the collateral flow is not adequate.  2. Chronic kidney disease:  Creatinine has improved on IV hydration.    3. Hyperlipidemia:  On Lipitor 80 daily.    Disposition:  Length of Stay: 2  [name], [name]., MD, [facility] 08/11/2013, 10:13 AM Office [phone] Pager [phone]

## 2013-08-11 NOTE — Progress Notes (Signed)
[place] for heparin Indication: chest pain/ACS  Allergies  Allergen Reactions  . Mobic [Meloxicam]     Unknown   . Tramadol     REACTION: itching, breaking out.   Patient Measurements: Height: 6\' 3"  (190.5 cm) Weight: 266 lb 4.8 oz (120.793 kg) IBW/kg (Calculated) : 84.5 Heparin Dosing Weight: 110kg  Vital Signs: Temp: 98.2 F (36.8 C) (09/10 1953) Temp src: Oral (09/10 1953) BP: 149/90 mmHg (09/10 1953) Pulse Rate: 78 (09/10 1953)  Labs:  Recent Labs  08/09/13 0342  08/09/13 1000 08/09/13 1520 08/09/13 1843 08/10/13 0435 08/11/13 0002  HGB 13.2  --   --   --   --  12.1*  --   HCT 37.0*  --   --   --   --  33.2*  --   PLT 159  --   --   --   --  145*  --   LABPROT  --   --   --  12.8  --   --   --   INR  --   --   --  0.98  --   --   --   HEPARINUNFRC  --   < > <0.10*  --  <0.10* 0.37 0.18*  CREATININE 2.99*  --   --   --   --  2.13*  --   TROPONINI 1.21*  --  1.51* 1.50*  --   --   --   < > = values in this interval not displayed. Estimated Creatinine Clearance: 63.9 ml/min (by C-G formula based on Cr of 2.13).  Assessment: 42 yo male with CAD s/p cath, awaiting possible PCI, for heparin Goal of Therapy:  Heparin level 0.3-0.7 units/ml Monitor platelets by anticoagulation protocol: Yes   Plan:  Increase Heparin 1850 units/hr Check heparin level in 8 hours.  Phillis Knack, PharmD, BCPS   08/11/2013 12:40 AM

## 2013-08-11 NOTE — Progress Notes (Signed)
Patient ID: Wayne Spencer, male   DOB: 1971/08/08, 42 y.o.   MRN: [id number]   [facility] Progress Note   Assessment/ Plan:   1 NSTEMI- Cath done yesterday shows an occluded RCA (likely subacute with collateral flow seen) with tight stenosis of 1st diagonal. Awaiting decision on need for PCI v/s medical management. Encouraged to ambulate to gauge his exertional symptoms. 2. CKD stage 4, (possible AKI on CKD3-4) with history of Nephrotic syndrome. Improvement of creatinine is likely dilutional post-fluids used for contrast prophylaxis, would DC IVFs and monitor in the hospital for at least another 24hrs and monitor renal function. 3. Hypokalemia - will replete via PO route  4. Hypertension: continue BB per cardiology, ACE-I/diuretics on hold at this time  Subjective:   Reports to be felling fair- denies any SOB/orthopnea   Objective:   BP 158/93  Pulse 93  Temp(Src) 98.4 F (36.9 C) (Oral)  Resp 16  Ht 6\' 3"  (1.905 m)  Wt 118 kg (260 lb 2.3 oz)  BMI 32.52 kg/m2  SpO2 99%  Intake/Output Summary (Last 24 hours) at 08/11/13 0823 Last data filed at 08/11/13 0700  Gross per 24 hour  Intake    240 ml  Output   2650 ml  Net  -2410 ml   Weight change: -2.793 kg (-6 lb 2.5 oz)  Physical Exam: [id number] resting in bed [id number] RRR, normal S1 and S2  Resp:Coarse BS, no rales/rhonchi [id number], obese, NT, BS normal Ext:2+ LE edema  Imaging: No results found.  Labs: BMET  Recent Labs Lab 08/09/13 0342 08/10/13 0435 08/11/13 0710  NA 136 138 140  K 2.8* 3.3* 3.0*  CL 101 109 109  CO2 26 20 22   GLUCOSE 102* 80 88  BUN 13 14 11   CREATININE 2.99* 2.13* 1.87*  CALCIUM 8.9 8.8 8.9   CBC  Recent Labs Lab 08/09/13 0342 08/10/13 0435 08/11/13 0710  WBC 8.4 8.3 8.7  NEUTROABS 4.3  --   --   HGB 13.2 12.1* 12.4*  HCT 37.0* 33.2* 34.6*  MCV 91.8 90.5 91.1  PLT 159 145* 146*   Medications:    . aspirin EC  81 mg Oral Daily  . atorvastatin  80  mg Oral q1800  . clopidogrel  75 mg Oral Q breakfast  . metoprolol tartrate  25 mg Oral BID  . potassium chloride SA  20 mEq Oral Daily  . sodium chloride  3 mL Intravenous Q12H   Elmarie Shiley, MD 08/11/2013, 8:23 AM

## 2013-08-12 DIAGNOSIS — I2510 Atherosclerotic heart disease of native coronary artery without angina pectoris: Secondary | ICD-10-CM

## 2013-08-12 LAB — CBC
HCT: 34 % — ABNORMAL LOW (ref 39.0–52.0)
Hemoglobin: 12.2 g/dL — ABNORMAL LOW (ref 13.0–17.0)
MCH: 33 pg (ref 26.0–34.0)
MCHC: 35.9 g/dL (ref 30.0–36.0)
MCV: 91.9 fL (ref 78.0–100.0)
Platelets: 147 10*3/uL — ABNORMAL LOW (ref 150–400)
RBC: 3.7 MIL/uL — ABNORMAL LOW (ref 4.22–5.81)
RDW: 13.3 % (ref 11.5–15.5)
WBC: 9.6 10*3/uL (ref 4.0–10.5)

## 2013-08-12 LAB — RENAL FUNCTION PANEL
Albumin: 2.4 g/dL — ABNORMAL LOW (ref 3.5–5.2)
BUN: 14 mg/dL (ref 6–23)
CO2: 23 mEq/L (ref 19–32)
Calcium: 8.9 mg/dL (ref 8.4–10.5)
Chloride: 107 mEq/L (ref 96–112)
Creatinine, Ser: 1.91 mg/dL — ABNORMAL HIGH (ref 0.50–1.35)
GFR calc Af Amer: 49 mL/min — ABNORMAL LOW (ref >90)
GFR calc non Af Amer: 42 mL/min — ABNORMAL LOW (ref >90)
Glucose, Bld: 126 mg/dL — ABNORMAL HIGH (ref 70–99)
Phosphorus: 4.2 mg/dL (ref 2.3–4.6)
Potassium: 3.3 mEq/L — ABNORMAL LOW (ref 3.5–5.1)
Sodium: 140 mEq/L (ref 135–145)

## 2013-08-12 MED ORDER — NITROGLYCERIN 0.4 MG SL SUBL: 0.4000 mg | SUBLINGUAL_TABLET | SUBLINGUAL | Status: DC | PRN

## 2013-08-12 MED ORDER — SODIUM CHLORIDE 0.9 % IJ SOLN
3.0000 mL | Freq: Two times a day (BID) | INTRAMUSCULAR | Status: DC
  Administered 2013-08-12 (×2): 3 mL via INTRAVENOUS

## 2013-08-12 MED ORDER — POTASSIUM CHLORIDE CRYS ER 20 MEQ PO TBCR
40.0000 meq | EXTENDED_RELEASE_TABLET | Freq: Once | ORAL | Status: AC
  Administered 2013-08-12: 09:00:00 40 meq via ORAL

## 2013-08-12 MED ORDER — HYDRALAZINE HCL 25 MG PO TABS
25.0000 mg | ORAL_TABLET | Freq: Two times a day (BID) | ORAL | Status: DC
Start: 2013-08-12 — End: 2014-01-09

## 2013-08-12 MED ORDER — CLOPIDOGREL BISULFATE 75 MG PO TABS: 75.0000 mg | ORAL_TABLET | Freq: Every day | ORAL | Status: DC

## 2013-08-12 MED ORDER — ASPIRIN 81 MG PO TBEC: 81.0000 mg | DELAYED_RELEASE_TABLET | Freq: Every day | ORAL | Status: AC

## 2013-08-12 MED ORDER — METOPROLOL TARTRATE 25 MG PO TABS: 25.0000 mg | ORAL_TABLET | Freq: Two times a day (BID) | ORAL | Status: DC

## 2013-08-12 MED ORDER — ATORVASTATIN CALCIUM 80 MG PO TABS: 80.0000 mg | ORAL_TABLET | Freq: Every day | ORAL | Status: DC

## 2013-08-12 NOTE — Progress Notes (Signed)
PROGRESS NOTE  Subjective:   Wayne Spencer is a 42 yo with hx of unstable angina ( class 3) and chronic renal insufficiency.  He was transferred from [facility] for unstable angina and NSTEMI.  He has been seen by nephrology.  Cath on 9/10 showed an occluded RCA and a tight stenosis in a small diag. Now s/p stenting of distal RCA  Objective:    Vital Signs:   Temp:  [97.6 F (36.4 C)-98.3 F (36.8 C)] 97.7 F (36.5 C) (09/12 0808) Pulse Rate:  [68-105] 105 (09/12 0808) Resp:  [18-20] 18 (09/12 0808) BP: (122-187)/(72-129) 156/95 mmHg (09/12 0808) SpO2:  [95 %-100 %] 97 % (09/12 0808) Weight:  [262 lb 2 oz (118.9 kg)] 262 lb 2 oz (118.9 kg) (09/12 0002)  Last BM Date: 08/11/13   24-hour weight change: Weight change: 1 lb 15.8 oz (0.9 kg)  Weight trends: Filed Weights   08/10/13 0508 08/11/13 0542 08/12/13 0002  Weight: 266 lb 4.8 oz (120.793 kg) 260 lb 2.3 oz (118 kg) 262 lb 2 oz (118.9 kg)    Intake/Output:  09/11 0701 - 09/12 0700 In: 3628.8 [P.O.:1900; I.V.:1728.8] Out: 3800 [Urine:3800]     Physical Exam: BP 156/95  Pulse 105  Temp(Src) 97.7 F (36.5 C) (Oral)  Resp 18  Ht 6\' 3"  (1.905 m)  Wt 262 lb 2 oz (118.9 kg)  BMI 32.76 kg/m2  SpO2 97%  General: Vital signs reviewed and noted.  Head: Normocephalic, atraumatic.  Eyes: conjunctivae/corneas clear.  EOM's intact.   Throat: normal  Neck:  normal  Lungs:   clear  Heart:  RR, normal S1, S2  Abdomen:  Soft, non-tender, non-distended    Extremities: No edema . Cath site is ok. Right radial site is ok    Neurologic: A&O X3, CN II - XII are grossly intact.   Psych: Normal    Labs: BMET:  Recent Labs  08/09/13 1000  08/11/13 0710 08/12/13 0426  NA  --   < > 140 140  K  --   < > 3.0* 3.3*  CL  --   < > 109 107  CO2  --   < > 22 23  GLUCOSE  --   < > 88 126*  BUN  --   < > 11 14  CREATININE  --   < > 1.87* 1.91*  CALCIUM  --   < > 8.9 8.9  MG 1.6  --   --   --   PHOS  --   --   --  4.2    < > = values in this interval not displayed.  Liver function tests:  Recent Labs  08/10/13 0435 08/12/13 0426  AST 20  --   ALT 12  --   ALKPHOS 44  --   BILITOT 0.1*  --   PROT 5.1*  --   ALBUMIN 2.2* 2.4*   No results found for this basename: LIPASE, AMYLASE,  in the last 72 hours  CBC:  Recent Labs  08/11/13 2225 08/12/13 0426  WBC 8.0 9.6  HGB 12.3* 12.2*  HCT 34.0* 34.0*  MCV 91.4 91.9  PLT 156 147*    Cardiac Enzymes:  Recent Labs  08/09/13 1000 08/09/13 1520  TROPONINI 1.51* 1.50*    Coagulation Studies:  Recent Labs  08/09/13 1520  LABPROT 12.8  INR 0.98    Other: No components found with this basename: POCBNP,  No results found for this basename: DDIMER,  in  the last 72 hours  Recent Labs  08/09/13 1000  HGBA1C 5.4    Recent Labs  08/10/13 0435  CHOL 243*  HDL 24*  LDLCALC 142*  TRIG 383*  CHOLHDL 10.1    Recent Labs  08/09/13 1000  TSH 4.001   No results found for this basename: VITAMINB12, FOLATE, FERRITIN, TIBC, IRON, RETICCTPCT,  in the last 72 hours   Other results: Tele:  NSR  Medications:    Infusions:    Scheduled Medications: . aspirin EC  81 mg Oral Daily  . atorvastatin  80 mg Oral q1800  . clopidogrel  75 mg Oral Q breakfast  . metoprolol tartrate  25 mg Oral BID  . potassium chloride SA  20 mEq Oral Daily  . sodium chloride  3 mL Intravenous Q12H    Assessment/ Plan:    1. NSTEMI:  Patient is doing well s/p PCI to RCA.  Creatinine is stable.   ASA Plavix lipitor 80 Metoprolol 25 bid Hydralazine 25 bid NTG  He would like to follow up in [place] - will need lab work  He also needs to see his medical doctor   2. Chronic kidney disease:  Creatinine has improved on IV hydration.   Follow up with renal  3. Hyperlipidemia:  On Lipitor 80 daily.    Disposition:  Length of Stay: 3  [name], [name]., MD, [facility] 08/12/2013, 8:17 AM Office [phone] Pager [phone]

## 2013-08-12 NOTE — Progress Notes (Signed)
Patient ID: Wayne Spencer, male   DOB: 08/16/1971, 42 y.o.   MRN: [id number]    [facility] Progress Note   Assessment/ Plan:   1 NSTEMI- s/p PCI with placement of DES X 1 in the distal RCA, now on ASA and plavix per cardiology 2. CKD stage 4-  (possible AKI on CKD3-4) with history of Nephrotic syndrome.  Creatinine stable, continue to hold Ace/Lasix.  3. Hypokalemia - will replete via PO route 4. Hypertension: continue BB per cardiology, ACE-I/diuretics on hold at this time  Subjective:   Feeling well without chest pain. Good appetite.    Objective:   BP 155/96  Pulse 76  Temp(Src) 97.7 F (36.5 C) (Oral)  Resp 20  Ht 6\' 3"  (1.905 m)  Wt 262 lb 2 oz (118.9 kg)  BMI 32.76 kg/m2  SpO2 99%  Intake/Output Summary (Last 24 hours) at 08/12/13 0802 Last data filed at 08/12/13 0641  Gross per 24 hour  Intake 3628.84 ml  Output   3800 ml  Net -171.16 ml   Weight change: 1 lb 15.8 oz (0.9 kg)  Physical Exam:  Gen: NAD, alert, cooperative with exam, walking around without effort HEENT: NCAT, EOMI, MMM CV: RRR, good S1/S2 Resp: CTABL, no wheezes, non-labored Abd: Soft, obese, + BS Ext: 1+ LE edema Neuro: Alert and oriented, No gross deficits  Imaging: No results found.  Labs: BMET  Recent Labs Lab 08/09/13 0342 08/10/13 0435 08/11/13 0710 08/12/13 0426  NA 136 138 140 140  K 2.8* 3.3* 3.0* 3.3*  CL 101 109 109 107  CO2 26 20 22 23   GLUCOSE 102* 80 88 126*  BUN 13 14 11 14   CREATININE 2.99* 2.13* 1.87* 1.91*  CALCIUM 8.9 8.8 8.9 8.9  PHOS  --   --   --  4.2   CBC  Recent Labs Lab 08/09/13 0342 08/10/13 0435 08/11/13 0710 08/11/13 2225 08/12/13 0426  WBC 8.4 8.3 8.7 8.0 9.6  NEUTROABS 4.3  --   --   --   --   HGB 13.2 12.1* 12.4* 12.3* 12.2*  HCT 37.0* 33.2* 34.6* 34.0* 34.0*  MCV 91.8 90.5 91.1 91.4 91.9  PLT 159 145* 146* 156 147*   Medications:    . aspirin EC  81 mg Oral Daily  . atorvastatin  80 mg Oral q1800  . clopidogrel   75 mg Oral Q breakfast  . metoprolol tartrate  25 mg Oral BID  . potassium chloride SA  20 mEq Oral Daily  . sodium chloride  3 mL Intravenous Q12H   Laroy Apple, MD [place] Resident, PGY-2 08/12/2013, 8:02 AM

## 2013-08-12 NOTE — Discharge Summary (Signed)
CARDIOLOGY DISCHARGE SUMMARY   Patient ID: Wayne Spencer MRN: [id number] DOB/AGE: 04-20-1971 42 y.o.  Admit date: 08/09/2013 Discharge date: 08/12/2013  Primary Discharge Diagnosis:   NSTEMI  - s/p 4.0x38 mm Promus Premier drug-eluting stent to the distal RCA  Secondary Discharge Diagnosis:    Tobacco abuse   Hyperlipidemia   Nephrotic syndrome   Hypertension   CAD (coronary artery disease)   CKD (chronic kidney disease) stage 4, GFR 15-29 ml/min   Hypokalemia  Consults: Nephrology  Procedures: Left heart cardiac catheterization, coronary arteriogram, staged recatheterization with PTCA and drug-eluting stent to the RCA, 2-D echocardiogram  Hospital Course: Wayne Spencer is a 42 y.o. male with no history of CAD. He went to the emergency room and [facility] for resting chest pain. He had recently been experiencing exertional chest pain but it had progressed. His initial troponin was elevated indicating a non-STEMI but his creatinine was also elevated at 2.99. He was transferred to [facility] admitted for further evaluation and treatment.  His ACE inhibitor and diuretic were held. His potassium was low on admission and was supplemented. He was hydrated gently and his creatinine improved, but he continued to require potassium supplementation. However, his continuing hypokalemia is possibly secondary to dehydration. Therefore, since he will not be on a diuretic at discharge, we will hold his potassium and leave the decision of further supplementation to his nephrologist. His cardiac enzymes remained elevated but he was chest-pain free on aspirin, heparin and nitrates. He was started on a beta blocker and a statin. Nephrology was consulted.   He was seen by Dr. Posey Pronto who recommended SPEP/FLC as well as some other tests and these were ordered. The results are below. By 08/10/2013, his renal function had improved to where he could be cathed with minimal dye used. The results are below. PCI to  the RCA/PDA was recommended but it was staged to allow his kidneys to recover from the dye. Hydration was continued.   Nephrology continued to follow him. His creatinine rate remained stable. On 08/11/2013, he was taken back to the Cath Lab with PCI to the PDA performed. A drug-eluting stent was used. He tolerated the procedure well.  On 08/12/2013, Wayne Spencer was seen by cardiac rehabilitation. He was educated on post-MI restrictions, heart-healthy lifestyle changes and exercise guidelines. He is encouraged to follow up with cardiac rehabilitation as an outpatient. He was seen by Dr. Acie Fredrickson and was ambulating without chest pain or shortness of breath. Dr. Acie Fredrickson evaluated Wayne Spencer and reviewed all data. His blood pressure medications were adjusted for better blood pressure control. Dr. Acie Fredrickson felt Wayne Spencer was significantly improved and appropriate for discharge home, to follow up as an outpatient.  Labs:  Lab Results  Component Value Date   WBC 9.6 08/12/2013   HGB 12.2* 08/12/2013   HCT 34.0* 08/12/2013   MCV 91.9 08/12/2013   PLT 147* 08/12/2013    Recent Labs Lab 08/10/13 0435  08/12/13 0426  NA 138  < > 140  K 3.3*  < > 3.3*  CL 109  < > 107  CO2 20  < > 23  BUN 14  < > 14  CREATININE 2.13*  < > 1.91*  CALCIUM 8.8  < > 8.9  PROT 5.1*  --   --   BILITOT 0.1*  --   --   ALKPHOS 44  --   --   ALT 12  --   --   AST 20  --   --  GLUCOSE 80  < > 126*  < > = values in this interval not displayed.  Recent Labs  08/09/13 1520  TROPONINI 1.50*   Lipid Panel     Component Value Date/Time   CHOL 243* 08/10/2013 0435   TRIG 383* 08/10/2013 0435   HDL 24* 08/10/2013 0435   CHOLHDL 10.1 08/10/2013 0435   VLDL 77* 08/10/2013 0435   LDLCALC 142* 08/10/2013 0435    Recent Labs  08/09/13 1520  INR 0.98   Urinalysis    Component Value Date/Time   total protein PLT   4.6   08/09/2013    albumin ALT   55.3      alpha-1 globulin   6.6      of the-2 globulin   13.2      beta  globulin   8.6      beta-2   4.9      gammaglobulin   11.4      M spike percent   not detected      SPE interpretation   the possibility of a faint restricted band cannot be completely excluded in the gamma region. suggest serum IFE to evaluate possibility if clinically indicated      kappa free light chain 2.81      lambda free light chain 2.13      kappa, lambda light chain ratio   1.32         prot/creat ratio 5.66        ANA   negative     anti-DNA antibody   5 (negative)     Lab Results  Component Value Date   TSH 4.001 08/09/2013   Lab Results  Component Value Date   HGBA1C 5.4 08/09/2013      Radiology: Dg Chest 2 View 08/09/2013   *RADIOLOGY REPORT*  Clinical Data: Pain  CHEST - 2 VIEW  Comparison: 10/30/2011  Findings: The heart size and pulmonary vascularity are normal. The lungs appear clear and expanded without focal air space disease or consolidation. No blunting of the costophrenic angles.  No pneumothorax.  Mediastinal contours appear intact.  Mild hyperinflation.  IMPRESSION: No evidence of active pulmonary disease.   Original Report Authenticated By: Lucienne Capers, M.D.   Cardiac Cath:  08/10/2013 Left Main: large normal  Left anterior Descending: large, mild - moderate irregularlites. The 1st diag is a small - moderate sized vessel with a 90% stenosis in the proximal segment  Left Circumflex: moderate - large vessel. Minor luminal irreg.  Right Coronary Artery: large and dominant. Occluded before the bifurcation . The distal RCA can be seen filling from collaterals from the left system  LV Gram: not performed. The AV was crossed for pressures.  Complications: No apparent complications  Patient did tolerate procedure well.  Contrast used: 36 cc contrast  Conclusions:  1. CAD : his RCA is occluded and fills via collaterals. His 1st diag has a tight stenosis but is probably too small to attempt PCI given his tenuous renal status. I have elected to not even pursue PCI  today to allow contrast to wash out ( creatinine is 2.14 today, was 2.9 yesterday)  08/11/2013 Lesion Data:  Vessel: RCA/distal  Percent stenosis (pre): 99  TIMI-flow (pre): 1  Stent: 4.0x38 mm DES  Percent stenosis (post): 0  TIMI-flow (post): 3  EKG:  12-Aug-2013   Normal sinus rhythm T wave abnormality, inferior leads Vent. rate 76 BPM PR interval 140 ms QRS duration 108 ms QT/QTc 402/452 ms  P-R-T axes -18 34 -19  Echo:  08/10/2013 Conclusions - Left ventricle: Mid and basal inferior/posterior hypokinesis The cavity size was mildly dilated. Wall thickness was normal. Systolic function was mildly reduced. The estimated ejection fraction was in the range of 45% to 50%. - Left atrium: The atrium was mildly dilated. - Atrial septum: No defect or patent foramen ovale was identified. - Pericardium, extracardiac: A trivial pericardial effusion was identified.   FOLLOW UP PLANS AND APPOINTMENTS Allergies  Allergen Reactions  . Mobic [Meloxicam]     Unknown   . Tramadol     REACTION: itching, breaking out.     Medication List    STOP taking these medications       BC HEADACHE POWDER PO     furosemide 20 MG tablet  Commonly known as:  LASIX     potassium chloride SA 20 MEQ tablet  Commonly known as:  K-DUR,KLOR-CON     simvastatin 40 MG tablet  Commonly known as:  ZOCOR      TAKE these medications       ALPRAZolam 0.5 MG tablet  Commonly known as:  XANAX  Take 0.5 mg by mouth 3 (three) times daily as needed for sleep.     aspirin 81 MG EC tablet  Take 1 tablet (81 mg total) by mouth daily.     atorvastatin 80 MG tablet  Commonly known as:  LIPITOR  Take 1 tablet (80 mg total) by mouth daily.     clopidogrel 75 MG tablet  Commonly known as:  PLAVIX  Take 1 tablet (75 mg total) by mouth daily.     cyclobenzaprine 10 MG tablet  Commonly known as:  FLEXERIL  Take 10 mg by mouth 3 (three) times daily as needed for muscle spasms.     hydrALAZINE 25 MG  tablet  Commonly known as:  APRESOLINE  Take 1 tablet (25 mg total) by mouth 2 (two) times daily.     HYDROcodone-acetaminophen 7.5-500 MG per tablet  Commonly known as:  LORTAB  Take 1 tablet by mouth every 6 (six) hours as needed for pain.     metoprolol tartrate 25 MG tablet  Commonly known as:  LOPRESSOR  Take 1 tablet (25 mg total) by mouth 2 (two) times daily.     nitroGLYCERIN 0.4 MG SL tablet  Commonly known as:  NITROSTAT  Place 1 tablet (0.4 mg total) under the tongue every 5 (five) minutes as needed for chest pain.        Discharge Orders   Future Appointments Provider Department Dept Phone   08/26/2013 1:50 PM Lendon Colonel, NP [facility] [place] [phone]   Future Orders Complete By Expires   Diet - low sodium heart healthy  As directed    Increase activity slowly  As directed      Follow-up Information   Follow up with Jory Sims, NP On 08/26/2013. (at 1:50 pm)    Specialty:  Nurse Practitioner   Contact information:   [address] [place] [address] [phone]       Follow up with Lanette Hampshire, MD. (As scheduled)    Specialty:  Family Medicine   Contact information:   [place] [address] [phone]       Schedule an appointment as soon as possible for a visit with [facility], MD.   Specialty:  Nephrology   Contact information:   [address] [place] [address] [phone]  BRING ALL MEDICATIONS WITH YOU TO FOLLOW UP APPOINTMENTS  Time spent with patient to include physician time: 38 min Signed: Rosaria Ferries, PA-C 08/12/2013, 10:01 AM Co-Sign MD  Attending Note:   The patient was seen and examined.  Agree with assessment and plan as noted above.  Changes made to the above note as needed.  See my note from today. He has done well since PCI.  He has significant renal disease and will need to be seen and get a BMP early next week.  He has required  potassium supplementation but I think this is because of our aggressive hydration and 2 cardiac caths.    [name], [name]., MD, [facility] 08/12/2013, 5:11 PM

## 2013-08-12 NOTE — Progress Notes (Signed)
I have personally seen and examined this patient and agree with the assessment/plan as outlined above by Wendi Snipes MD (PGY2). Will set up f/u upon DC. Ifrah Vest K.,MD 08/12/2013 10:29 AM

## 2013-08-12 NOTE — Progress Notes (Signed)
CARDIAC REHAB PHASE I   Pt ambulating independently and feels well. BP elevated. In depth ed with wife present. Good reception "I want to live". Not planning to smoke. Discussed diet change and increasing ex. Interested in [place] in [place] and will send referral. Will need to apply for financial aid, gave application.  [place], [place], ACSM 08/12/2013 9:21 AM

## 2013-08-14 ENCOUNTER — Encounter (HOSPITAL_COMMUNITY): Payer: Self-pay | Admitting: *Deleted

## 2013-08-14 ENCOUNTER — Emergency Department (HOSPITAL_COMMUNITY): Payer: 59

## 2013-08-14 ENCOUNTER — Emergency Department (HOSPITAL_COMMUNITY)
Admission: EM | Admit: 2013-08-14 | Discharge: 2013-08-15 | Disposition: A | Discharge status: Home / Self Care | Payer: 59 | Attending: Emergency Medicine | Admitting: Emergency Medicine

## 2013-08-14 DIAGNOSIS — Z872 Personal history of diseases of the skin and subcutaneous tissue: Secondary | ICD-10-CM | POA: Insufficient documentation

## 2013-08-14 DIAGNOSIS — R0989 Other specified symptoms and signs involving the circulatory and respiratory systems: Secondary | ICD-10-CM | POA: Insufficient documentation

## 2013-08-14 DIAGNOSIS — E785 Hyperlipidemia, unspecified: Secondary | ICD-10-CM | POA: Insufficient documentation

## 2013-08-14 DIAGNOSIS — I10 Essential (primary) hypertension: Secondary | ICD-10-CM | POA: Insufficient documentation

## 2013-08-14 DIAGNOSIS — R609 Edema, unspecified: Secondary | ICD-10-CM | POA: Insufficient documentation

## 2013-08-14 DIAGNOSIS — R0600 Dyspnea, unspecified: Secondary | ICD-10-CM

## 2013-08-14 DIAGNOSIS — R0609 Other forms of dyspnea: Secondary | ICD-10-CM | POA: Insufficient documentation

## 2013-08-14 DIAGNOSIS — F17200 Nicotine dependence, unspecified, uncomplicated: Secondary | ICD-10-CM | POA: Insufficient documentation

## 2013-08-14 DIAGNOSIS — G8929 Other chronic pain: Secondary | ICD-10-CM | POA: Insufficient documentation

## 2013-08-14 DIAGNOSIS — E876 Hypokalemia: Secondary | ICD-10-CM

## 2013-08-14 DIAGNOSIS — Z79899 Other long term (current) drug therapy: Secondary | ICD-10-CM | POA: Insufficient documentation

## 2013-08-14 DIAGNOSIS — Z87448 Personal history of other diseases of urinary system: Secondary | ICD-10-CM | POA: Insufficient documentation

## 2013-08-14 DIAGNOSIS — I2510 Atherosclerotic heart disease of native coronary artery without angina pectoris: Secondary | ICD-10-CM | POA: Insufficient documentation

## 2013-08-14 DIAGNOSIS — R42 Dizziness and giddiness: Secondary | ICD-10-CM | POA: Insufficient documentation

## 2013-08-14 DIAGNOSIS — I509 Heart failure, unspecified: Secondary | ICD-10-CM | POA: Insufficient documentation

## 2013-08-14 DIAGNOSIS — Z7982 Long term (current) use of aspirin: Secondary | ICD-10-CM | POA: Insufficient documentation

## 2013-08-14 DIAGNOSIS — R000 Tachycardia, unspecified: Secondary | ICD-10-CM | POA: Insufficient documentation

## 2013-08-14 LAB — D-DIMER, QUANTITATIVE (NOT AT ARMC): D-Dimer, Quant: 0.78 ug/mL-FEU — ABNORMAL HIGH (ref 0.00–0.48)

## 2013-08-14 LAB — CBC WITH DIFFERENTIAL/PLATELET
Basophils Absolute: 0 10*3/uL (ref 0.0–0.1)
Basophils Relative: 0 % (ref 0–1)
Eosinophils Absolute: 0.2 10*3/uL (ref 0.0–0.7)
Eosinophils Relative: 2 % (ref 0–5)
HCT: 36.3 % — ABNORMAL LOW (ref 39.0–52.0)
Hemoglobin: 12.8 g/dL — ABNORMAL LOW (ref 13.0–17.0)
Lymphocytes Relative: 23 % (ref 12–46)
Lymphs Abs: 2.2 10*3/uL (ref 0.7–4.0)
MCH: 32.8 pg (ref 26.0–34.0)
MCHC: 35.3 g/dL (ref 30.0–36.0)
MCV: 93.1 fL (ref 78.0–100.0)
Monocytes Absolute: 1 10*3/uL (ref 0.1–1.0)
Monocytes Relative: 11 % (ref 3–12)
Neutro Abs: 6.1 10*3/uL (ref 1.7–7.7)
Neutrophils Relative %: 64 % (ref 43–77)
Platelets: 145 10*3/uL — ABNORMAL LOW (ref 150–400)
RBC: 3.9 MIL/uL — ABNORMAL LOW (ref 4.22–5.81)
RDW: 13.1 % (ref 11.5–15.5)
WBC: 9.6 10*3/uL (ref 4.0–10.5)

## 2013-08-14 LAB — BASIC METABOLIC PANEL
BUN: 15 mg/dL (ref 6–23)
CO2: 23 mEq/L (ref 19–32)
Calcium: 8.8 mg/dL (ref 8.4–10.5)
Chloride: 102 mEq/L (ref 96–112)
Creatinine, Ser: 2.36 mg/dL — ABNORMAL HIGH (ref 0.50–1.35)
GFR calc Af Amer: 38 mL/min — ABNORMAL LOW (ref >90)
GFR calc non Af Amer: 33 mL/min — ABNORMAL LOW (ref >90)
Glucose, Bld: 124 mg/dL — ABNORMAL HIGH (ref 70–99)
Potassium: 3.1 mEq/L — ABNORMAL LOW (ref 3.5–5.1)
Sodium: 135 mEq/L (ref 135–145)

## 2013-08-14 LAB — TROPONIN I: Troponin I: 0.38 ng/mL (ref <0.30)

## 2013-08-14 MED ORDER — POTASSIUM CHLORIDE CRYS ER 20 MEQ PO TBCR
40.0000 meq | EXTENDED_RELEASE_TABLET | Freq: Once | ORAL | Status: AC
  Administered 2013-08-14: 40 meq via ORAL
  Filled 2013-08-14: qty 2

## 2013-08-14 NOTE — ED Provider Notes (Signed)
CSN: [id number]     Arrival date & time 08/14/13  2016 History  This chart was scribed for Richarda Blade, MD by Marlowe Kays, ED Scribe. This patient was seen in room APA19/APA19 and the patient's care was started at 9:29 PM.   Chief Complaint  Patient presents with  . Shortness of Breath   The history is provided by the patient. No language interpreter was used.   HPI Comments:  Wayne Spencer is a 42 y.o. male who presents to the Emergency Department complaining of constant, moderate shortness of breath onset one week. He reports associated intermittent dizziness. He states he thinks his symptoms are due to his metoprolol which is a new medication for him. He reports presenting to the ED 5 days ago with similar symptoms. He has an appt with Dr. Acie Fredrickson in one week. Pt denies chest pain, fever, or wheezing. He states he has no issues eating. He is on muscle relaxers and pain medication for chronic back pain. Pt reports he has recently stopped smoking. He reports h/o HTN, CAD, hyperlipidemia, and stent placement.   Past Medical History  Diagnosis Date  . Cellulitis   . Hypertension   . Nephrotic syndrome   . Hyperlipidemia   . Tobacco abuse   . Chronic headache   . CAD (coronary artery disease)    Past Surgical History  Procedure Laterality Date  . Coronary stent placement     Family History  Problem Relation Age of Onset  . Heart attack Father     79s  . Heart attack Brother     54s, s/p CABG, passed from MI at 38  . Heart attack Sister     19s   History  Substance Use Topics  . Smoking status: Current Every Day Smoker -- 3.00 packs/day for 6 years  . Smokeless tobacco: Not on file  . Alcohol Use: Yes     Comment: occasional    Review of Systems  Constitutional: Negative for fever and appetite change.  Respiratory: Positive for shortness of breath. Negative for wheezing.   Cardiovascular: Negative for chest pain.  Gastrointestinal: Negative for nausea and vomiting.   All other systems reviewed and are negative.    Allergies  Mobic and Tramadol  Home Medications   Current Outpatient Rx  Name  Route  Sig  Dispense  Refill  . ALPRAZolam (XANAX) 0.5 MG tablet   Oral   Take 0.5 mg by mouth every 4 (four) hours as needed for anxiety.          Marland Kitchen aspirin EC 81 MG EC tablet   Oral   Take 1 tablet (81 mg total) by mouth daily.         Marland Kitchen atorvastatin (LIPITOR) 80 MG tablet   Oral   Take 1 tablet (80 mg total) by mouth daily.   30 tablet   11   . clopidogrel (PLAVIX) 75 MG tablet   Oral   Take 1 tablet (75 mg total) by mouth daily.   30 tablet   11   . cyclobenzaprine (FLEXERIL) 10 MG tablet   Oral   Take 10 mg by mouth 3 (three) times daily as needed for muscle spasms.         . hydrALAZINE (APRESOLINE) 25 MG tablet   Oral   Take 1 tablet (25 mg total) by mouth 2 (two) times daily.   60 tablet   11   . HYDROcodone-acetaminophen (NORCO) 7.5-325 MG per tablet  Oral   Take 1 tablet by mouth every 4 (four) hours as needed for pain.         . metoprolol tartrate (LOPRESSOR) 25 MG tablet   Oral   Take 1 tablet (25 mg total) by mouth 2 (two) times daily.   60 tablet   11   . nitroGLYCERIN (NITROSTAT) 0.4 MG SL tablet   Sublingual   Place 1 tablet (0.4 mg total) under the tongue every 5 (five) minutes as needed for chest pain.   25 tablet   3    Triage Vitals: BP 142/79  Pulse 118  Temp(Src) 99 F (37.2 C) (Oral)  Resp 20  Ht 6' 3.5" (1.918 m)  Wt 260 lb (117.935 kg)  BMI 32.06 kg/m2  SpO2 99% Physical Exam  Nursing note and vitals reviewed. Constitutional: He is oriented to person, place, and time. He appears well-developed and well-nourished. No distress.  HENT:  Head: Normocephalic and atraumatic.  Eyes: Conjunctivae and EOM are normal. Pupils are equal, round, and reactive to light.  Neck: Normal range of motion. Neck supple.  Cardiovascular: Normal rate, normal heart sounds and intact distal pulses.    Tachycardic  Pulmonary/Chest: Effort normal and breath sounds normal. No respiratory distress. He has no wheezes. He has no rales. He exhibits no tenderness and no bony tenderness.  Abdominal: Soft. Normal appearance. There is no tenderness.  Musculoskeletal: Normal range of motion. He exhibits no edema (no lower extremity edema.) and no tenderness (no leg tenderness.).  Neurological: He is alert and oriented to person, place, and time. He has normal strength. No cranial nerve deficit or sensory deficit. He exhibits normal muscle tone. Coordination normal.  Skin: Skin is warm, dry and intact.  Psychiatric: He has a normal mood and affect. His behavior is normal. Judgment and thought content normal.    ED Course  Procedures (including critical care time) DIAGNOSTIC STUDIES: Oxygen Saturation is 99% on RA, normal by my interpretation.   COORDINATION OF CARE: 9:32 PM- Will consult with pt's cardiologist and wait for labs to come back. Pt verbalizes understanding and agrees to plan.  10:26 PM- After consult with cardiologist ( Dr. Radford Pax), will check for pulmonary embolsis.   2300-CT scan ordered since d-dimer is elevated   Date: 08/14/13  Rate: 114  Rhythm: sinus tachycardia  QRS Axis: normal  PR and QT Intervals: normal  ST/T Wave abnormalities: Inferior T wave inversion  PR and QRS Conduction Disutrbances:none  Narrative Interpretation:   Old EKG Reviewed: Changed since 08/12/2013, rate is faster   Medications  potassium chloride SA (K-DUR,KLOR-CON) CR tablet 40 mEq (40 mEq Oral Given 08/14/13 2139)   Labs Review Labs Reviewed  CBC WITH DIFFERENTIAL - Abnormal; Notable for the following:    RBC 3.90 (*)    Hemoglobin 12.8 (*)    HCT 36.3 (*)    Platelets 145 (*)    All other components within normal limits  BASIC METABOLIC PANEL - Abnormal; Notable for the following:    Potassium 3.1 (*)    Glucose, Bld 124 (*)    Creatinine, Ser 2.36 (*)    GFR calc non Af Amer 33 (*)     GFR calc Af Amer 38 (*)    All other components within normal limits  TROPONIN I - Abnormal; Notable for the following:    Troponin I 0.38 (*)    All other components within normal limits  D-DIMER, QUANTITATIVE - Abnormal; Notable for the following:    D-Dimer,  Quant 0.78 (*)    All other components within normal limits   Imaging Review Dg Chest 2 View  08/14/2013   CLINICAL DATA:  Shortness of breath.  EXAM: CHEST  2 VIEW  COMPARISON:  CHEST x-ray 08/09/2013.  FINDINGS: Lung volumes are normal. No consolidative airspace disease. No pleural effusions. No pneumothorax. No pulmonary nodule or mass noted. Pulmonary vasculature and the cardiomediastinal silhouette are within normal limits.  IMPRESSION: 1. No radiographic evidence of acute cardiopulmonary disease.   Electronically Signed   By: Vinnie Langton M.D.   On: 08/14/2013 21:14    MDM   1. Dyspnea    Shortness of breath with recent NSTEMI and subsequent PTCA. No chest pain tonight. He had chest pain with the recent NSTEMI. Decreasing troponin is reassuring, and consistent with normal downward trend post MI. Doubt medication complication. Elevated creatinine, required V/Q scan for concern of PE, after return of  elevated d-dimer. He is hemodynamically stable. If he has convincing evidence for PE, this can be the suspected source of his dyspnea. There is no clear evidence for PE, and he continues to be symptomatic; he may need evaluation by cardiology, urgently.   Nursing Notes Reviewed/ Care Coordinated, and agree without changes. Applicable Imaging Reviewed.  Interpretation of Laboratory Data incorporated into ED treatment  Plan: Care to Dr. Roxanne Mins to evaluate after return of V/Q scan.  I personally performed the services described in this documentation, which was scribed in my presence. The recorded information has been reviewed and is accurate.      Richarda Blade, MD 08/16/13 1420

## 2013-08-14 NOTE — ED Notes (Signed)
Family at bedside.Patient states that he is not having any pain or shortness of breath at this time.

## 2013-08-14 NOTE — ED Notes (Addendum)
Pt recently had a NSTEMI and stent placed and now c/o sob. Pt thinks it is a reaction to metoprolol. Pt also told me he had a blood clot the size of a golfball in his coronary artery and the treated  and resolved it before he left.

## 2013-08-14 NOTE — ED Notes (Signed)
CRITICAL VALUE ALERT  Critical value received: Troponin 0.38  Date of notification: 08/14/13  Time of notification:  2151  Critical value read back: Yes  Nurse who received alert:  Elwanda Brooklyn, RN  MD notified (1st page):  Dr. Eulis Foster  Time of first page:  2155  Responding MD:  Dr. Eulis Foster  Time MD responded:  2155

## 2013-08-15 ENCOUNTER — Telehealth: Payer: Self-pay | Admitting: Adult Health

## 2013-08-15 ENCOUNTER — Emergency Department (HOSPITAL_COMMUNITY): Payer: 59

## 2013-08-15 ENCOUNTER — Encounter (HOSPITAL_COMMUNITY): Payer: Self-pay

## 2013-08-15 LAB — PRO B NATRIURETIC PEPTIDE: Pro B Natriuretic peptide (BNP): 1791 pg/mL — ABNORMAL HIGH (ref 0–125)

## 2013-08-15 MED ORDER — POTASSIUM CHLORIDE CRYS ER 20 MEQ PO TBCR
40.0000 meq | EXTENDED_RELEASE_TABLET | Freq: Once | ORAL | Status: AC
  Administered 2013-08-15: 40 meq via ORAL
  Filled 2013-08-15: qty 2

## 2013-08-15 MED ORDER — TECHNETIUM TO 99M ALBUMIN AGGREGATED
6.0000 | Freq: Once | INTRAVENOUS | Status: AC | PRN
  Administered 2013-08-15: 6 via INTRAVENOUS

## 2013-08-15 MED ORDER — TECHNETIUM TC 99M DIETHYLENETRIAME-PENTAACETIC ACID
40.0000 | Freq: Once | INTRAVENOUS | Status: AC | PRN
  Administered 2013-08-15: 40 via INTRAVENOUS

## 2013-08-15 NOTE — ED Notes (Signed)
Pt awaiting scan.

## 2013-08-15 NOTE — Telephone Encounter (Signed)
Called pt home number twice and noted as disconnected, called other number listed for Wayne Spencer and was advised her mailbox was full at this time

## 2013-08-15 NOTE — ED Provider Notes (Signed)
Patient initially seen and evaluated by Dr. Eulis Foster. He lung scan was obtained to rule out pulmonary embolism and has come back low probability. Patient states that he is feeling much better. On exam, he is noted to have 1+ presacral edema and 2+ pedal and pretibial edema. I reviewed his hospital records and he had been sent home with instructions to stop taking his furosemide. BNP is noted to be moderately elevated. He does not have physical findings of CHF other than edema. There is no neck vein distention and no rales. However, I feel that this is most likely the cause of his dyspnea. He is advised to resume taking his furosemide. He had also been taken off of his potassium supplement and he is advised to resume taking his potassium supplements. He is to take double doses at each for the next 3 days and then resume his previous dose. He will need to work with his cardiologist and nephrologist to find his dry weight which can be used to adjust dose of furosemide as time goes on. Patient expresses understanding of the plan. His scheduled appointment with his cardiologist is in 11 days. He is to try and move that appointment to the next 3-4 days.  Delora Fuel, MD [phone]

## 2013-08-15 NOTE — Telephone Encounter (Signed)
Would like to speak with nurse about reaction he thinks he is having to Metoprolol. / tgs

## 2013-08-16 ENCOUNTER — Telehealth: Payer: Self-pay | Admitting: *Deleted

## 2013-08-16 NOTE — Telephone Encounter (Signed)
Not familiar with patient. I reviewed recent records. He was recently admitted to [facility] and managed by Dr. Acie Fredrickson, underwent percutaneous intervention with NSTEMI. Recently discharged on 9/12. I also see a telephone note from yesterday regarding question about possible reaction to metoprolol. Based on this and unusual symptoms being described, I would suggest that this patient be seen in the [facility] ER to exclude any adverse medication reaction or other acute, evolving issue.

## 2013-08-16 NOTE — Telephone Encounter (Signed)
Pt's wife called the office stating that her husband's neck, shoulders, and throat are hurting. Pt's wife states that his neck is real stiff. Pt's wife denies any swelling/chest pains. Pt does have SOB. Pt's wife took his BP over the phone it is 145/98 with pulse of 95. Pt states he did not take his BP med this morning. Pt was in the ED on 9/11. Pt was verbally agitated and non compliant with questions from his wife. Please advise.

## 2013-08-16 NOTE — Telephone Encounter (Signed)
Called to advised pt that the MD suggest he get evaluated in the ER. Pt states that he is feeling a little better and can breathe easier now. Pt also states that if he starts feeling worse during the day , then he will go to the ER.

## 2013-08-16 NOTE — Telephone Encounter (Signed)
Noted concern addressed by HD LPN/DR SM in phone notation noted on 08-16-13

## 2013-08-26 ENCOUNTER — Encounter: Payer: 59 | Admitting: Adult Health

## 2013-08-29 ENCOUNTER — Encounter: Payer: 59 | Admitting: Adult Health

## 2013-08-29 ENCOUNTER — Encounter: Payer: Self-pay | Admitting: Adult Health

## 2013-08-29 NOTE — Progress Notes (Signed)
   HPI: [name] is a 42 year old patient here on hospital followup to be est. with Dr. Harl Bowie after undergoing cardiac catheterization in the setting of a non-ST elevation MI on 08/09/2013. The patient was also found to have elevated creatinine and was given hydration with ACE inhibitor and diuretic discontinued. Cardiac catheterization was completed demonstrating occluded right coronary artery, with first diagonal stenosis but too small for PCI. A 4.0 x 30 mm drug-eluting stent was placed in the right coronary artery.was also followed by nephrology. Lasix and potassium along with simvastatin were discontinued and he was started on clopidogrel/ASA  hydralazine metoprolol and atorvastatin .   Allergies  Allergen Reactions  . Mobic [Meloxicam]     Unknown   . Tramadol     REACTION: itching, breaking out.    Current Outpatient Prescriptions  Medication Sig Dispense Refill  . ALPRAZolam (XANAX) 0.5 MG tablet Take 0.5 mg by mouth every 4 (four) hours as needed for anxiety.       Marland Kitchen aspirin EC 81 MG EC tablet Take 1 tablet (81 mg total) by mouth daily.      Marland Kitchen atorvastatin (LIPITOR) 80 MG tablet Take 1 tablet (80 mg total) by mouth daily.  30 tablet  11  . clopidogrel (PLAVIX) 75 MG tablet Take 1 tablet (75 mg total) by mouth daily.  30 tablet  11  . cyclobenzaprine (FLEXERIL) 10 MG tablet Take 10 mg by mouth 3 (three) times daily as needed for muscle spasms.      . hydrALAZINE (APRESOLINE) 25 MG tablet Take 1 tablet (25 mg total) by mouth 2 (two) times daily.  60 tablet  11  . HYDROcodone-acetaminophen (NORCO) 7.5-325 MG per tablet Take 1 tablet by mouth every 4 (four) hours as needed for pain.      . metoprolol tartrate (LOPRESSOR) 25 MG tablet Take 1 tablet (25 mg total) by mouth 2 (two) times daily.  60 tablet  11  . nitroGLYCERIN (NITROSTAT) 0.4 MG SL tablet Place 1 tablet (0.4 mg total) under the tongue every 5 (five) minutes as needed for chest pain.  25 tablet  3   No current  facility-administered medications for this visit.    Past Medical History  Diagnosis Date  . Cellulitis   . Hypertension   . Nephrotic syndrome   . Hyperlipidemia   . Tobacco abuse   . Chronic headache   . CAD (coronary artery disease)     Past Surgical History  Procedure Laterality Date  . Coronary stent placement      ROS: PHYSICAL EXAM There were no vitals taken for this visit.  EKG:  ASSESSMENT AND PLAN

## 2013-09-05 ENCOUNTER — Encounter: Payer: Self-pay | Admitting: Adult Health

## 2013-09-05 ENCOUNTER — Ambulatory Visit (INDEPENDENT_AMBULATORY_CARE_PROVIDER_SITE_OTHER): Payer: 59 | Admitting: Adult Health

## 2013-09-05 VITALS — BP 140/90 | HR 93 | Ht 75.0 in | Wt 266.0 lb

## 2013-09-05 DIAGNOSIS — F17200 Nicotine dependence, unspecified, uncomplicated: Secondary | ICD-10-CM

## 2013-09-05 DIAGNOSIS — Z720 Tobacco use: Secondary | ICD-10-CM

## 2013-09-05 DIAGNOSIS — I214 Non-ST elevation (NSTEMI) myocardial infarction: Secondary | ICD-10-CM

## 2013-09-05 DIAGNOSIS — I2510 Atherosclerotic heart disease of native coronary artery without angina pectoris: Secondary | ICD-10-CM

## 2013-09-05 DIAGNOSIS — I10 Essential (primary) hypertension: Secondary | ICD-10-CM

## 2013-09-05 NOTE — Assessment & Plan Note (Signed)
Smoking cessation counseling is provided. He verbalizes understanding and will continue to try to quit. He plans on using electronic cigarette to assist.

## 2013-09-05 NOTE — Progress Notes (Signed)
HPI: [name] is a 42 year old patient we are following posthospitalization after admission for non-ST elevation MI on 08/09/2013 requiring a promise drug-eluting stent to the distal right coronary artery. The patient had been experiencing chest discomfort, but also had elevated creatinine of 2.99, he was hydrated and plan for cardiac catheterization. Cardiac catheterization completed on 08/10/2013 demonstrated standing oh being small with moderate size vessel of a 90% stenosis in the proximal segment with a totally occluded right coronary artery, filling via collaterals. EF was 45% to 50%. Due to elevated creatinine, the patient had a stage PCI to the distal RCA the following day. He was discharged on dual antiplatelet therapy with clopidogrel and aspirin daily he remained on beta blocker 25 mg by mouth twice a day, and placed on statin therapy.    He has had to use NTG once since being discharged. He unfortunately continues to smoke, but has cut down from 4 packs a day to 1 pack a day. He continues to work on this.   Allergies  Allergen Reactions  . Mobic [Meloxicam]     Unknown   . Tramadol     REACTION: itching, breaking out.    Current Outpatient Prescriptions  Medication Sig Dispense Refill  . ALPRAZolam (XANAX) 0.5 MG tablet Take 0.5 mg by mouth every 4 (four) hours as needed for anxiety.       Marland Kitchen aspirin EC 81 MG EC tablet Take 1 tablet (81 mg total) by mouth daily.      Marland Kitchen atorvastatin (LIPITOR) 80 MG tablet Take 1 tablet (80 mg total) by mouth daily.  30 tablet  11  . clopidogrel (PLAVIX) 75 MG tablet Take 1 tablet (75 mg total) by mouth daily.  30 tablet  11  . cyclobenzaprine (FLEXERIL) 10 MG tablet Take 10 mg by mouth 3 (three) times daily as needed for muscle spasms.      . furosemide (LASIX) 40 MG tablet Take 40 mg by mouth as needed (Take 1 tablet when needed).      . hydrALAZINE (APRESOLINE) 25 MG tablet Take 1 tablet (25 mg total) by mouth 2 (two) times daily.  60 tablet   11  . HYDROcodone-acetaminophen (NORCO) 7.5-325 MG per tablet Take 1 tablet by mouth every 4 (four) hours as needed for pain.      . metoprolol tartrate (LOPRESSOR) 25 MG tablet Take 1 tablet (25 mg total) by mouth 2 (two) times daily.  60 tablet  11  . nitroGLYCERIN (NITROSTAT) 0.4 MG SL tablet Place 1 tablet (0.4 mg total) under the tongue every 5 (five) minutes as needed for chest pain.  25 tablet  3  . potassium chloride SA (K-DUR,KLOR-CON) 20 MEQ tablet Take 20 mEq by mouth daily.       No current facility-administered medications for this visit.    Past Medical History  Diagnosis Date  . Cellulitis   . Hypertension   . Nephrotic syndrome   . Hyperlipidemia   . Tobacco abuse   . Chronic headache   . CAD (coronary artery disease)     Past Surgical History  Procedure Laterality Date  . Coronary stent placement      [id number] of systems complete and found to be negative unless listed above  PHYSICAL EXAM BP 140/90  Pulse 93  Ht 6\' 3"  (1.905 m)  Wt 266 lb (120.657 kg)  BMI 33.25 kg/m2 General: Well developed, well nourished, in no acute distress, smells heavily of cigarettes.  Head: Eyes PERRLA, No  xanthomas.   Normal cephalic and atramatic  Lungs: Clear bilaterally to auscultation and percussion. Heart: HRRR S1 S2, slightly tachycardic without MRG.  Pulses are 2+ & equal.            No carotid bruit. No JVD.  No abdominal bruits. No femoral bruits. Abdomen: Bowel sounds are positive, abdomen soft and non-tender without masses or                  Hernia's noted. Msk:  Back normal, normal gait. Normal strength and tone for age. Extremities: No clubbing, cyanosis or edema.  DP +1 Neuro: Alert and oriented X 3. Psych:  Good affect, responds appropriately  EKG: NSR no evidence of ischemia.Rate of  96 bpm  ASSESSMENT AND PLAN

## 2013-09-05 NOTE — Assessment & Plan Note (Signed)
He is doing well, tolerating his medications without side effects. No bleeding or bruising seen. He will continue DAPT and heart healthy diet. He was shown Cardiac Stent placement video. Will advise him to follow up with Dr. Bronson Ing or Harl Bowie in 4 months.

## 2013-09-05 NOTE — Patient Instructions (Signed)
Your physician recommends that you schedule a follow-up appointment in: 4 months You will receive a reminder letter two months in advance reminding you to call and schedule your appointment. If you don't receive this letter, please contact our office.  Your physician recommends that you continue on your current medications as directed. Please refer to the Current Medication list given to you today.

## 2013-09-05 NOTE — Progress Notes (Signed)
Name: Wayne Spencer    DOB: [birth year]  Age: 42 y.o.  MR#: [id number]       PCP:  Lanette Hampshire, MD      Insurance: Payor: [employer] / Plan: [employer] / Product Type: *No Product type* /   CC:    Chief Complaint  Patient presents with  . Coronary Artery Disease  . Hypertension    VS Filed Vitals:   09/05/13 1431  BP: 140/90  Pulse: 93  Height: 6\' 3"  (1.905 m)  Weight: 266 lb (120.657 kg)    Weights Current Weight  09/05/13 266 lb (120.657 kg)  08/14/13 260 lb (117.935 kg)  08/12/13 262 lb 2 oz (118.9 kg)    Blood Pressure  BP Readings from Last 3 Encounters:  09/05/13 140/90  08/15/13 130/76  08/12/13 156/95     Admit date:  (Not on file) Last encounter with RMR:  08/15/2013   Allergy Mobic and Tramadol  Current Outpatient Prescriptions  Medication Sig Dispense Refill  . ALPRAZolam (XANAX) 0.5 MG tablet Take 0.5 mg by mouth every 4 (four) hours as needed for anxiety.       Marland Kitchen aspirin EC 81 MG EC tablet Take 1 tablet (81 mg total) by mouth daily.      Marland Kitchen atorvastatin (LIPITOR) 80 MG tablet Take 1 tablet (80 mg total) by mouth daily.  30 tablet  11  . clopidogrel (PLAVIX) 75 MG tablet Take 1 tablet (75 mg total) by mouth daily.  30 tablet  11  . cyclobenzaprine (FLEXERIL) 10 MG tablet Take 10 mg by mouth 3 (three) times daily as needed for muscle spasms.      . furosemide (LASIX) 40 MG tablet Take 40 mg by mouth as needed (Take 1 tablet when needed).      . hydrALAZINE (APRESOLINE) 25 MG tablet Take 1 tablet (25 mg total) by mouth 2 (two) times daily.  60 tablet  11  . HYDROcodone-acetaminophen (NORCO) 7.5-325 MG per tablet Take 1 tablet by mouth every 4 (four) hours as needed for pain.      . metoprolol tartrate (LOPRESSOR) 25 MG tablet Take 1 tablet (25 mg total) by mouth 2 (two) times daily.  60 tablet  11  . nitroGLYCERIN (NITROSTAT) 0.4 MG SL tablet Place 1 tablet (0.4 mg total) under the tongue every 5 (five) minutes as needed for chest pain.  25 tablet  3  .  potassium chloride SA (K-DUR,KLOR-CON) 20 MEQ tablet Take 20 mEq by mouth daily.       No current facility-administered medications for this visit.    Discontinued Meds:   There are no discontinued medications.  Patient Active Problem List   Diagnosis Date Noted  . Non-ST elevation myocardial infarction (NSTEMI), initial episode of care 08/11/2013  . CKD (chronic kidney disease) stage 4, GFR 15-29 ml/min 08/11/2013  . Hypokalemia 08/11/2013  . Tobacco abuse   . Hyperlipidemia   . Nephrotic syndrome   . Hypertension   . CAD (coronary artery disease)   . SCIATICA 10/30/2008  . BACK PAIN 10/30/2008    LABS    Component Value Date/Time   NA 135 08/14/2013 2058   NA 140 08/12/2013 0426   NA 140 08/11/2013 0710   K 3.1* 08/14/2013 2058   K 3.3* 08/12/2013 0426   K 3.0* 08/11/2013 0710   CL 102 08/14/2013 2058   CL 107 08/12/2013 0426   CL 109 08/11/2013 0710   CO2 23 08/14/2013 2058   CO2  23 08/12/2013 0426   CO2 22 08/11/2013 0710   GLUCOSE 124* 08/14/2013 2058   GLUCOSE 126* 08/12/2013 0426   GLUCOSE 88 08/11/2013 0710   BUN 15 08/14/2013 2058   BUN 14 08/12/2013 0426   BUN 11 08/11/2013 0710   CREATININE 2.36* 08/14/2013 2058   CREATININE 1.91* 08/12/2013 0426   CREATININE 1.87* 08/11/2013 0710   CALCIUM 8.8 08/14/2013 2058   CALCIUM 8.9 08/12/2013 0426   CALCIUM 8.9 08/11/2013 0710   GFRNONAA 33* 08/14/2013 2058   GFRNONAA 42* 08/12/2013 0426   GFRNONAA 43* 08/11/2013 0710   GFRAA 38* 08/14/2013 2058   GFRAA 49* 08/12/2013 0426   GFRAA 50* 08/11/2013 0710   CMP     Component Value Date/Time   NA 135 08/14/2013 2058   K 3.1* 08/14/2013 2058   CL 102 08/14/2013 2058   CO2 23 08/14/2013 2058   GLUCOSE 124* 08/14/2013 2058   BUN 15 08/14/2013 2058   CREATININE 2.36* 08/14/2013 2058   CALCIUM 8.8 08/14/2013 2058   PROT 5.1* 08/10/2013 0435   ALBUMIN 2.4* 08/12/2013 0426   AST 20 08/10/2013 0435   ALT 12 08/10/2013 0435   ALKPHOS 44 08/10/2013 0435   BILITOT 0.1* 08/10/2013 0435   GFRNONAA 33*  08/14/2013 2058   GFRAA 38* 08/14/2013 2058       Component Value Date/Time   WBC 9.6 08/14/2013 2058   WBC 9.6 08/12/2013 0426   WBC 8.0 08/11/2013 2225   HGB 12.8* 08/14/2013 2058   HGB 12.2* 08/12/2013 0426   HGB 12.3* 08/11/2013 2225   HCT 36.3* 08/14/2013 2058   HCT 34.0* 08/12/2013 0426   HCT 34.0* 08/11/2013 2225   MCV 93.1 08/14/2013 2058   MCV 91.9 08/12/2013 0426   MCV 91.4 08/11/2013 2225    Lipid Panel     Component Value Date/Time   CHOL 243* 08/10/2013 0435   TRIG 383* 08/10/2013 0435   HDL 24* 08/10/2013 0435   CHOLHDL 10.1 08/10/2013 0435   VLDL 77* 08/10/2013 0435   LDLCALC 142* 08/10/2013 0435    ABG    Component Value Date/Time   TCO2 23 08/19/2009 2045     Lab Results  Component Value Date   TSH 4.001 08/09/2013   BNP (last 3 results)  Recent Labs  08/15/13 0052  PROBNP 1791.0*   Cardiac Panel (last 3 results) No results found for this basename: CKTOTAL, CKMB, TROPONINI, RELINDX,  in the last 72 hours  Iron/TIBC/Ferritin No results found for this basename: iron, tibc, ferritin     EKG Orders placed during the hospital encounter of 08/14/13  . EKG 12-LEAD  . EKG 12-LEAD  . ED EKG  . ED EKG  . EKG     Prior Assessment and Plan Problem List as of 09/05/2013     Cardiovascular and Mediastinum   Hypertension   CAD (coronary artery disease)   Non-ST elevation myocardial infarction (NSTEMI), initial episode of care     Nervous and Auditory   SCIATICA     Genitourinary   Nephrotic syndrome   CKD (chronic kidney disease) stage 4, GFR 15-29 ml/min     Other   BACK PAIN   Tobacco abuse   Hyperlipidemia   Hypokalemia       Imaging: Dg Chest 2 View  08/14/2013   CLINICAL DATA:  Shortness of breath.  EXAM: CHEST  2 VIEW  COMPARISON:  CHEST x-ray 08/09/2013.  FINDINGS: Lung volumes are normal. No consolidative airspace disease. No pleural effusions.  No pneumothorax. No pulmonary nodule or mass noted. Pulmonary vasculature and the cardiomediastinal  silhouette are within normal limits.  IMPRESSION: 1. No radiographic evidence of acute cardiopulmonary disease.   Electronically Signed   By: Vinnie Langton M.D.   On: 08/14/2013 21:14   Dg Chest 2 View  08/09/2013   *RADIOLOGY REPORT*  Clinical Data: Pain  CHEST - 2 VIEW  Comparison: 10/30/2011  Findings: The heart size and pulmonary vascularity are normal. The lungs appear clear and expanded without focal air space disease or consolidation. No blunting of the costophrenic angles.  No pneumothorax.  Mediastinal contours appear intact.  Mild hyperinflation.  IMPRESSION: No evidence of active pulmonary disease.   Original Report Authenticated By: Lucienne Capers, M.D.   Nm Pulmonary Perf And Vent  08/15/2013   CLINICAL DATA:  Shortness of breath.  EXAM: NUCLEAR MEDICINE VENTILATION - PERFUSION LUNG SCAN  TECHNIQUE: Ventilation images were obtained in multiple projections using inhaled aerosol technetium 99 M DTPA. Perfusion images were obtained in multiple projections after intravenous injection of Tc-72m MAA.  COMPARISON:  None.  RADIOPHARMACEUTICALS:  40 mCi Tc-70m DTPA aerosol and 6 mCi Tc-58m MAA  FINDINGS: Ventilation: No focal ventilation defect. There is mild deposition of tracer in the hila from turbulent airflow.  Perfusion: No wedge shaped peripheral perfusion defects to suggest acute pulmonary embolism.  IMPRESSION: Negative study. Low probability for pulmonary embolism.   Electronically Signed   By: Jorje Guild   On: 08/15/2013 02:14

## 2013-09-05 NOTE — Assessment & Plan Note (Signed)
Continue current medications. He is not started on ACE due to CKD>

## 2014-01-09 ENCOUNTER — Encounter: Payer: Self-pay | Admitting: Cardiology

## 2014-01-09 ENCOUNTER — Ambulatory Visit (INDEPENDENT_AMBULATORY_CARE_PROVIDER_SITE_OTHER): Payer: Self-pay | Admitting: Cardiology

## 2014-01-09 VITALS — BP 145/68 | HR 44 | Ht 73.0 in | Wt 260.0 lb

## 2014-01-09 DIAGNOSIS — E785 Hyperlipidemia, unspecified: Secondary | ICD-10-CM

## 2014-01-09 DIAGNOSIS — Z720 Tobacco use: Secondary | ICD-10-CM

## 2014-01-09 DIAGNOSIS — I10 Essential (primary) hypertension: Secondary | ICD-10-CM

## 2014-01-09 DIAGNOSIS — F17200 Nicotine dependence, unspecified, uncomplicated: Secondary | ICD-10-CM

## 2014-01-09 DIAGNOSIS — I2510 Atherosclerotic heart disease of native coronary artery without angina pectoris: Secondary | ICD-10-CM

## 2014-01-09 MED ORDER — NICOTINE 21 MG/24HR TD PT24: 21.0000 mg | MEDICATED_PATCH | Freq: Every day | TRANSDERMAL | Status: DC

## 2014-01-09 MED ORDER — AMLODIPINE BESYLATE 5 MG PO TABS: 5.0000 mg | ORAL_TABLET | Freq: Every day | ORAL | Status: DC

## 2014-01-09 NOTE — Progress Notes (Signed)
Clinical Summary Wayne Spencer is a 43 y.o.male last seen by NP Wayne Spencer, this is our first visit together. He is seen for the following medical problems.  1. CAD - prior NSTEMI 08/2013, DES to RCA. - 08/2013 Echo: LVEF 45-50%, hypokinesis inferior wall - not on ACE due to renal function  - has had some chest pain. Dull pain left chest, 2/10. + palpitions. Can occur with rest or exertion. Nothing makes worst. Lasts up to 5 minutes. Occurs approx 2-3 times a day. This has come on since he started back smoking.  - compliant with meds  2. CKD - followed by kidney doctors, Wayne Spencer.  3. Tobacco abuse - 1 ppd, has not tried nicotine in the past before  4. HTN - does not check regularly at home - compliant with meds  5. Hyperlipidemia - compliant with atorvastatin 80mg  - 08/2013: TC 243 TG 383 HDL 24 LDL 142  6. Bradycardia - denies any lightheadedness or dizziness  Past Medical History  Diagnosis Date  . Cellulitis   . Hypertension   . Nephrotic syndrome   . Hyperlipidemia   . Tobacco abuse   . Chronic headache   . CAD (coronary artery disease)      Allergies  Allergen Reactions  . Mobic [Meloxicam]     Unknown   . Tramadol     REACTION: itching, breaking out.     Current Outpatient Prescriptions  Medication Sig Dispense Refill  . ALPRAZolam (XANAX) 0.5 MG tablet Take 0.5 mg by mouth every 4 (four) hours as needed for anxiety.       Marland Kitchen aspirin EC 81 MG EC tablet Take 1 tablet (81 mg total) by mouth daily.      Marland Kitchen atorvastatin (LIPITOR) 80 MG tablet Take 1 tablet (80 mg total) by mouth daily.  30 tablet  11  . clopidogrel (PLAVIX) 75 MG tablet Take 1 tablet (75 mg total) by mouth daily.  30 tablet  11  . cyclobenzaprine (FLEXERIL) 10 MG tablet Take 10 mg by mouth 3 (three) times daily as needed for muscle spasms.      . furosemide (LASIX) 40 MG tablet Take 40 mg by mouth as needed (Take 1 tablet when needed).      . hydrALAZINE (APRESOLINE) 25 MG tablet Take 1  tablet (25 mg total) by mouth 2 (two) times daily.  60 tablet  11  . HYDROcodone-acetaminophen (NORCO) 7.5-325 MG per tablet Take 1 tablet by mouth every 4 (four) hours as needed for pain.      . metoprolol tartrate (LOPRESSOR) 25 MG tablet Take 1 tablet (25 mg total) by mouth 2 (two) times daily.  60 tablet  11  . nitroGLYCERIN (NITROSTAT) 0.4 MG SL tablet Place 1 tablet (0.4 mg total) under the tongue every 5 (five) minutes as needed for chest pain.  25 tablet  3  . potassium chloride SA (K-DUR,KLOR-CON) 20 MEQ tablet Take 20 mEq by mouth daily.       No current facility-administered medications for this visit.     Past Surgical History  Procedure Laterality Date  . Coronary stent placement       Allergies  Allergen Reactions  . Mobic [Meloxicam]     Unknown   . Tramadol     REACTION: itching, breaking out.      Family History  Problem Relation Age of Onset  . Heart attack Father     38s  . Heart attack Brother  23s, s/p CABG, passed from MI at 46  . Heart attack Sister     73s     Social History Wayne Spencer reports that he has been smoking.  He does not have any smokeless tobacco history on file. Wayne Spencer reports that he drinks alcohol.   Review of Systems CONSTITUTIONAL: No weight loss, fever, chills, weakness or fatigue.  HEENT: Eyes: No visual loss, blurred vision, double vision or yellow sclerae.No hearing loss, sneezing, congestion, runny nose or sore throat.  SKIN: No rash or itching.  CARDIOVASCULAR: per HPI RESPIRATORY: No shortness of breath, cough or sputum.  GASTROINTESTINAL: No anorexia, nausea, vomiting or diarrhea. No abdominal pain or blood.  GENITOURINARY: No burning on urination, no polyuria NEUROLOGICAL: No headache, dizziness, syncope, paralysis, ataxia, numbness or tingling in the extremities. No change in bowel or bladder control.  MUSCULOSKELETAL: No muscle, back pain, joint pain or stiffness.  LYMPHATICS: No enlarged nodes. No history  of splenectomy.  PSYCHIATRIC: No history of depression or anxiety.  ENDOCRINOLOGIC: No reports of sweating, cold or heat intolerance. No polyuria or polydipsia.  Marland Kitchen   Physical Examination p 44 bp 148/80 Wt 260 BMI 34 Gen: resting comfortably, no acute distress HEENT: no scleral icterus, pupils equal round and reactive, no palptable cervical adenopathy,  CV: RRR, no m/r/g, no JVD, no carotid bruits Resp: Clear to auscultation bilaterally GI: abdomen is soft, non-tender, non-distended, normal bowel sounds, no hepatosplenomegaly MSK: extremities are warm, no edema.  Skin: warm, no rash Neuro:  no focal deficits Psych: appropriate affect   Diagnostic Studies 08/11/13 Cath Hemodynamics:  LV pressure: 156/25  Aortic pressure: 153/96  Angiography  Left Main: large normal  Left anterior Descending: large, mild - moderate irregularlites. The 1st diag is a small - moderate sized vessel with a 90% stenosis in the proximal segment  Left Circumflex: moderate - large vessel. Minor luminal irreg.  Right Coronary Artery: large and dominant. Occluded before the bifurcation . The distal RCA can be seen filling from collaterals from the left system  LV Gram: not performed. The AV was crossed for pressures.  Complications: No apparent complications  Patient did tolerate procedure well.  Contrast used: 36 cc contrast   Conclusions:  1. CAD : his RCA is occluded and fills via collaterals. His 1st diag has a tight stenosis but is probably too small to attempt PCI given his tenuous renal status.  Lesion Data:  Vessel: RCA/distal  Percent stenosis (pre): 99  TIMI-flow (pre): 1  Stent: 4.0x38 mm DES  Percent stenosis (post): 0  TIMI-flow (post): 3  Conclusions: Successful PCI of the distal RCA  Recommendations: ASA/plavix at least 12 months. Integrelin 12 hours.  08/2013 Echo LVEF 45-50%, inferior hypokinesis, mild LAE  Clinic EKG 01/09/14: Bigeminy Assessment and Plan  1. CAD - reports  occasional chest discomfort, unclear if cardiac in etiology. He did have severe disease in a small diagonal that was too small for stenting - add norvasc 5mg  daily as additional antianginal  2. CKD - repeat BMET, he is followed by nephrology Dr Posey Spencer in [place], [place]  3. Tobacco abuse - still smoking 1 pack per day, educated on health risks. Prescribed nicotine patches  4. HTN - not at goal given renal dysfunction, start norvasc 5mg  daily  5. Hyperlipidemia - repeat lipid panel  6. Bradycardia - asymptomatic, likely related to metoprolol. EKG shows bigeminy at rate of 80, likely PVCs not being detected by pulse check on vitals - continue beta blocker  Arnoldo Lenis, M.D., F.A.C.C.

## 2014-01-09 NOTE — Patient Instructions (Addendum)
Your physician recommends that you schedule a follow-up appointment in: 1 month  Your physician recommends that you return for lab work this week. BMET w/ GFR, Lipids, mag  Your physician has recommended you make the following change in your medication:  1. Start Norvasc 5 mg daily  Start Nicoderm patch

## 2014-01-14 LAB — MAGNESIUM: Magnesium: 1.9 mg/dL (ref 1.5–2.5)

## 2014-01-14 LAB — BASIC METABOLIC PANEL WITH GFR
BUN: 15 mg/dL (ref 6–23)
CO2: 23 mEq/L (ref 19–32)
Calcium: 8.5 mg/dL (ref 8.4–10.5)
Chloride: 108 mEq/L (ref 96–112)
Creat: 2.14 mg/dL — ABNORMAL HIGH (ref 0.50–1.35)
GFR, Est African American: 43 mL/min — ABNORMAL LOW
GFR, Est Non African American: 37 mL/min — ABNORMAL LOW
Glucose, Bld: 140 mg/dL — ABNORMAL HIGH (ref 70–99)
Potassium: 4.3 mEq/L (ref 3.5–5.3)
Sodium: 138 mEq/L (ref 135–145)

## 2014-01-14 LAB — LIPID PANEL
Cholesterol: 172 mg/dL (ref 0–200)
HDL: 24 mg/dL — ABNORMAL LOW (ref >39)
LDL Cholesterol: 109 mg/dL — ABNORMAL HIGH (ref 0–99)
Total CHOL/HDL Ratio: 7.2 Ratio
Triglycerides: 197 mg/dL — ABNORMAL HIGH (ref <150)
VLDL: 39 mg/dL (ref 0–40)

## 2014-01-19 ENCOUNTER — Telehealth: Payer: Self-pay | Admitting: *Deleted

## 2014-01-19 NOTE — Telephone Encounter (Signed)
Denyse Amass called from [facility]. She states the pt can get Crestor for free. Waiting for fax for Dr to fill out. Once received we will change Lipitor to Crestor 40 mg daily.

## 2014-01-30 ENCOUNTER — Telehealth: Payer: Self-pay | Admitting: *Deleted

## 2014-01-30 NOTE — Telephone Encounter (Signed)
Pt states last night that his BP was 156/76. Has not checked it since then. Does not have a headache at this time. Told him to keep a blood pressure log until he can be seen on 02/01/14. Pt has blurred vision when BP is high but no other symptoms associated.

## 2014-01-30 NOTE — Telephone Encounter (Signed)
PT wife is calling/ She states that husband has been having very terrible headaches and his BP has been up. I moved appt to 02/01/14 with Jory Sims (this is her first opening). What should pt do in the mean time?

## 2014-01-31 NOTE — Progress Notes (Signed)
HPI: [name] is a 43 year old patient of Dr. Leanord Asal following for ongoing assessment and management of CAD, prior non-ST elevation MI in September 2014 with drug-eluting stent to the right coronary artery, moderately diminished LV function with an EF of 45% with hypokinesis of the inferior wall. Other history includes hyperlipidemia, hypertension, ongoing tobacco abuse, and chronic kidney disease.  He was last seen by Dr. Harl Bowie on 01/09/2014 amlodipine 5 mg was added due to 2 complaints of angina. He was counseled on smoking cessation. Blood pressure was now well controlled with hopes of adding amlodipine assisting. Followup labs to include a BMET and a lipid panel was completed.  Labs completed on 01/14/2014 demonstrated sodium 138 potassium 4.3 chloride 108 CO2 23 BUN 15 creatinine 2.14 magnesium 1.9. Total cholesterol 172 triglycerides 197 HDL 24 LDL 109.  He comes today with complaints of lower back pain related to slipping in the mind. He also has some complaints of headaches which he has been noticing in the afternoon. He has chronic aches and pains in his upper chest and torso. He takes Flexeril and Norco.   Allergies  Allergen Reactions  . Mobic [Meloxicam]     Unknown   . Tramadol     REACTION: itching, breaking out.    Current Outpatient Prescriptions  Medication Sig Dispense Refill  . ALPRAZolam (XANAX) 0.5 MG tablet Take 0.5 mg by mouth every 4 (four) hours as needed for anxiety.       Marland Kitchen amLODipine (NORVASC) 5 MG tablet Take 1 tablet (5 mg total) by mouth daily.  30 tablet  6  . aspirin EC 81 MG EC tablet Take 1 tablet (81 mg total) by mouth daily.      Marland Kitchen atorvastatin (LIPITOR) 80 MG tablet Take 1 tablet (80 mg total) by mouth daily.  30 tablet  11  . clopidogrel (PLAVIX) 75 MG tablet Take 1 tablet (75 mg total) by mouth daily.  30 tablet  11  . cyclobenzaprine (FLEXERIL) 10 MG tablet Take 10 mg by mouth 3 (three) times daily as needed for muscle spasms.      .  furosemide (LASIX) 40 MG tablet Take 40 mg by mouth every morning.       . hydrALAZINE (APRESOLINE) 50 MG tablet Take 50 mg by mouth 3 (three) times daily. Recently increased by Dr. Elmarie Shiley (kidney MD).      Marland Kitchen HYDROcodone-acetaminophen (NORCO) 7.5-325 MG per tablet Take 1 tablet by mouth every 4 (four) hours as needed for pain.      . metoprolol (LOPRESSOR) 50 MG tablet Take 100 mg by mouth 2 (two) times daily. Recently increased by Dr. Elmarie Shiley (kidney MD).      . potassium chloride SA (K-DUR,KLOR-CON) 20 MEQ tablet Take 20 mEq by mouth 2 (two) times daily.       . nitroGLYCERIN (NITROSTAT) 0.4 MG SL tablet Place 1 tablet (0.4 mg total) under the tongue every 5 (five) minutes as needed for chest pain.  25 tablet  3   No current facility-administered medications for this visit.    Past Medical History  Diagnosis Date  . Cellulitis   . Hypertension   . Nephrotic syndrome   . Hyperlipidemia   . Tobacco abuse   . Chronic headache   . CAD (coronary artery disease)     Past Surgical History  Procedure Laterality Date  . Coronary stent placement      [id number] of systems complete and found to be negative unless listed  above  PHYSICAL EXAM BP 139/85  Pulse 84  Ht 6\' 1"  (1.854 m)  Wt 275 lb 6.4 oz (124.921 kg)  BMI 36.34 kg/m2  SpO2 98%  General: Well developed, well nourished, in no acute distress Head: Eyes PERRLA, No xanthomas.   Normal cephalic and atramatic  Lungs: Clear bilaterally to auscultation and percussion. Heart: HRRR S1 S2, without MRG.  Pulses are 2+ & equal.            No carotid bruit. No JVD.  No abdominal bruits. No femoral bruits. Abdomen: Bowel sounds are positive, abdomen soft and non-tender without masses or                  Hernia's noted. Msk:  Back normal sore with movement of upper torso, normal gait. Normal strength and tone for age. Extremities: No clubbing, cyanosis or edema.  DP +1 Neuro: Alert and oriented X 3. Psych:  Good affect, responds  appropriately    ASSESSMENT AND PLAN

## 2014-02-01 ENCOUNTER — Ambulatory Visit (INDEPENDENT_AMBULATORY_CARE_PROVIDER_SITE_OTHER): Payer: Self-pay | Admitting: Adult Health

## 2014-02-01 ENCOUNTER — Encounter: Payer: Self-pay | Admitting: Adult Health

## 2014-02-01 VITALS — BP 139/85 | HR 84 | Ht 73.0 in | Wt 275.4 lb

## 2014-02-01 DIAGNOSIS — Z720 Tobacco use: Secondary | ICD-10-CM

## 2014-02-01 DIAGNOSIS — I10 Essential (primary) hypertension: Secondary | ICD-10-CM

## 2014-02-01 DIAGNOSIS — F17200 Nicotine dependence, unspecified, uncomplicated: Secondary | ICD-10-CM

## 2014-02-01 DIAGNOSIS — M549 Dorsalgia, unspecified: Secondary | ICD-10-CM

## 2014-02-01 DIAGNOSIS — I2510 Atherosclerotic heart disease of native coronary artery without angina pectoris: Secondary | ICD-10-CM

## 2014-02-01 NOTE — Assessment & Plan Note (Signed)
Smoking cessation is discussed. I doubt that he is motivated at this time to stop. He is aware of the risk with known history of CAD.

## 2014-02-01 NOTE — Progress Notes (Deleted)
Name: Wayne Spencer    DOB: 1971/01/26  Age: 43 y.o.  MR#: [id number]       PCP:  Lanette Hampshire, MD      Insurance: Payor: MEDICAID PENDING / Plan: MEDICAID PENDING / Product Type: *No Product type* /   CC:    Chief Complaint  Patient presents with  . Coronary Artery Disease  . Hypertension    VS Filed Vitals:   02/01/14 1531  BP: 139/85  Pulse: 84  Height: 6\' 1"  (1.854 m)  Weight: 275 lb 6.4 oz (124.921 kg)  SpO2: 98%    Weights Current Weight  02/01/14 275 lb 6.4 oz (124.921 kg)  01/09/14 260 lb (117.935 kg)  09/05/13 266 lb (120.657 kg)    Blood Pressure  BP Readings from Last 3 Encounters:  02/01/14 139/85  01/09/14 145/68  09/05/13 140/90     Admit date:  (Not on file) Last encounter with RMR:  09/05/2013   Allergy Mobic and Tramadol  Current Outpatient Prescriptions  Medication Sig Dispense Refill  . ALPRAZolam (XANAX) 0.5 MG tablet Take 0.5 mg by mouth every 4 (four) hours as needed for anxiety.       Marland Kitchen amLODipine (NORVASC) 5 MG tablet Take 1 tablet (5 mg total) by mouth daily.  30 tablet  6  . aspirin EC 81 MG EC tablet Take 1 tablet (81 mg total) by mouth daily.      Marland Kitchen atorvastatin (LIPITOR) 80 MG tablet Take 1 tablet (80 mg total) by mouth daily.  30 tablet  11  . clopidogrel (PLAVIX) 75 MG tablet Take 1 tablet (75 mg total) by mouth daily.  30 tablet  11  . cyclobenzaprine (FLEXERIL) 10 MG tablet Take 10 mg by mouth 3 (three) times daily as needed for muscle spasms.      . furosemide (LASIX) 40 MG tablet Take 40 mg by mouth every morning.       . hydrALAZINE (APRESOLINE) 50 MG tablet Take 50 mg by mouth 3 (three) times daily. Recently increased by Dr. Elmarie Shiley (kidney MD).      Marland Kitchen HYDROcodone-acetaminophen (NORCO) 7.5-325 MG per tablet Take 1 tablet by mouth every 4 (four) hours as needed for pain.      . metoprolol (LOPRESSOR) 50 MG tablet Take 100 mg by mouth 2 (two) times daily. Recently increased by Dr. Elmarie Shiley (kidney MD).      . potassium chloride  SA (K-DUR,KLOR-CON) 20 MEQ tablet Take 20 mEq by mouth 2 (two) times daily.       . nitroGLYCERIN (NITROSTAT) 0.4 MG SL tablet Place 1 tablet (0.4 mg total) under the tongue every 5 (five) minutes as needed for chest pain.  25 tablet  3   No current facility-administered medications for this visit.    Discontinued Meds:    Medications Discontinued During This Encounter  Medication Reason  . metoprolol tartrate (LOPRESSOR) 25 MG tablet Change in therapy  . hydrALAZINE (APRESOLINE) 25 MG tablet Dose change  . nicotine (NICODERM CQ) 21 mg/24hr patch Prescription never filled    Patient Active Problem List   Diagnosis Date Noted  . NSTEMI (non-ST elevated myocardial infarction) 09/05/2013  . Non-ST elevation myocardial infarction (NSTEMI), initial episode of care 08/11/2013  . CKD (chronic kidney disease) stage 4, GFR 15-29 ml/min 08/11/2013  . Hypokalemia 08/11/2013  . Tobacco abuse   . Hyperlipidemia   . Nephrotic syndrome   . Hypertension   . CAD (coronary artery disease)   . SCIATICA 10/30/2008  .  BACK PAIN 10/30/2008    LABS    Component Value Date/Time   NA 138 01/14/2014 1135   NA 135 08/14/2013 2058   NA 140 08/12/2013 0426   K 4.3 01/14/2014 1135   K 3.1* 08/14/2013 2058   K 3.3* 08/12/2013 0426   CL 108 01/14/2014 1135   CL 102 08/14/2013 2058   CL 107 08/12/2013 0426   CO2 23 01/14/2014 1135   CO2 23 08/14/2013 2058   CO2 23 08/12/2013 0426   GLUCOSE 140* 01/14/2014 1135   GLUCOSE 124* 08/14/2013 2058   GLUCOSE 126* 08/12/2013 0426   BUN 15 01/14/2014 1135   BUN 15 08/14/2013 2058   BUN 14 08/12/2013 0426   CREATININE 2.14* 01/14/2014 1135   CREATININE 2.36* 08/14/2013 2058   CREATININE 1.91* 08/12/2013 0426   CREATININE 1.87* 08/11/2013 0710   CALCIUM 8.5 01/14/2014 1135   CALCIUM 8.8 08/14/2013 2058   CALCIUM 8.9 08/12/2013 0426   GFRNONAA 33* 08/14/2013 2058   GFRNONAA 42* 08/12/2013 0426   GFRNONAA 43* 08/11/2013 0710   GFRAA 38* 08/14/2013 2058   GFRAA 49* 08/12/2013 0426    GFRAA 50* 08/11/2013 0710   CMP     Component Value Date/Time   NA 138 01/14/2014 1135   K 4.3 01/14/2014 1135   CL 108 01/14/2014 1135   CO2 23 01/14/2014 1135   GLUCOSE 140* 01/14/2014 1135   BUN 15 01/14/2014 1135   CREATININE 2.14* 01/14/2014 1135   CREATININE 2.36* 08/14/2013 2058   CALCIUM 8.5 01/14/2014 1135   PROT 5.1* 08/10/2013 0435   ALBUMIN 2.4* 08/12/2013 0426   AST 20 08/10/2013 0435   ALT 12 08/10/2013 0435   ALKPHOS 44 08/10/2013 0435   BILITOT 0.1* 08/10/2013 0435   GFRNONAA 33* 08/14/2013 2058   GFRAA 38* 08/14/2013 2058       Component Value Date/Time   WBC 9.6 08/14/2013 2058   WBC 9.6 08/12/2013 0426   WBC 8.0 08/11/2013 2225   HGB 12.8* 08/14/2013 2058   HGB 12.2* 08/12/2013 0426   HGB 12.3* 08/11/2013 2225   HCT 36.3* 08/14/2013 2058   HCT 34.0* 08/12/2013 0426   HCT 34.0* 08/11/2013 2225   MCV 93.1 08/14/2013 2058   MCV 91.9 08/12/2013 0426   MCV 91.4 08/11/2013 2225    Lipid Panel     Component Value Date/Time   CHOL 172 01/14/2014 1135   TRIG 197* 01/14/2014 1135   HDL 24* 01/14/2014 1135   CHOLHDL 7.2 01/14/2014 1135   VLDL 39 01/14/2014 1135   LDLCALC 109* 01/14/2014 1135    ABG    Component Value Date/Time   TCO2 23 08/19/2009 2045     Lab Results  Component Value Date   TSH 4.001 08/09/2013   BNP (last 3 results)  Recent Labs  08/15/13 0052  PROBNP 1791.0*   Cardiac Panel (last 3 results) No results found for this basename: CKTOTAL, CKMB, TROPONINI, RELINDX,  in the last 72 hours  Iron/TIBC/Ferritin No results found for this basename: iron, tibc, ferritin     EKG Orders placed in visit on 01/09/14  . EKG 12-LEAD     Prior Assessment and Plan Problem List as of 02/01/2014     Cardiovascular and Mediastinum   Hypertension   Last Assessment & Plan   09/05/2013 Office Visit Written 09/05/2013  2:59 PM by Lendon Colonel, NP     Continue current medications. He is not started on ACE due to CKD>    CAD (  coronary artery disease)   Last  Assessment & Plan   09/05/2013 Office Visit Written 09/05/2013  2:58 PM by Lendon Colonel, NP     He is doing well, tolerating his medications without side effects. No bleeding or bruising seen. He will continue DAPT and heart healthy diet. He was shown Cardiac Stent placement video. Will advise him to follow up with Dr. Bronson Ing or Harl Bowie in 4 months.    Non-ST elevation myocardial infarction (NSTEMI), initial episode of care   NSTEMI (non-ST elevated myocardial infarction)     Nervous and Auditory   SCIATICA     Genitourinary   Nephrotic syndrome   CKD (chronic kidney disease) stage 4, GFR 15-29 ml/min     Other   BACK PAIN   Tobacco abuse   Last Assessment & Plan   09/05/2013 Office Visit Written 09/05/2013  2:59 PM by Lendon Colonel, NP     Smoking cessation counseling is provided. He verbalizes understanding and will continue to try to quit. He plans on using electronic cigarette to assist.     Hyperlipidemia   Hypokalemia       Imaging: No results found.

## 2014-02-01 NOTE — Assessment & Plan Note (Signed)
Chronic for him. Defer to primary care physician for ongoing management and referral as medically necessary.

## 2014-02-01 NOTE — Patient Instructions (Signed)
Your physician recommends that you schedule a follow-up appointment in: 6 months with Dr Bryna Colander will receive a reminder letter two months in advance reminding you to call and schedule your appointment. If you don't receive this letter, please contact our office.  Your physician recommends that you continue on your current medications as directed. Please refer to the Current Medication list given to you today.

## 2014-02-01 NOTE — Assessment & Plan Note (Signed)
Well-controlled currently. He continues to have some pain which does increase his blood pressure at times. He is medically compliant. He is having some muscle aches and pains, I do not think that any are related to statin, as he has been on that medication without other symptoms. He has twisted his back slipping in the snow and ice and mild over the last week. I have advised him to see his primary care physician if the pain is not better and to take his medications as directed. We will see him in 6 months unless he becomes symptomatic

## 2014-02-01 NOTE — Assessment & Plan Note (Signed)
He will continue on current medical management to include statin, aspirin, metoprolol, and hopefully increase his activity in order to lose weight. Risk management is planned only without any further testing.

## 2014-02-06 ENCOUNTER — Ambulatory Visit: Payer: Self-pay | Admitting: Adult Health

## 2014-03-10 ENCOUNTER — Telehealth: Payer: Self-pay | Admitting: *Deleted
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
## 2014-03-10 NOTE — Telephone Encounter (Signed)
Called pt made him aware these symptoms are a sign of stroke and that he needed to go to the ER ASAP. Pt had symptoms yesterday and pt called our office today.

## 2014-03-18 ENCOUNTER — Emergency Department (HOSPITAL_COMMUNITY)
Admission: EM | Admit: 2014-03-18 | Discharge: 2014-03-18 | Disposition: A | Discharge status: Home / Self Care | Payer: Self-pay | Attending: Emergency Medicine | Admitting: Emergency Medicine

## 2014-03-18 ENCOUNTER — Encounter (HOSPITAL_COMMUNITY): Payer: Self-pay | Admitting: Emergency Medicine

## 2014-03-18 DIAGNOSIS — I129 Hypertensive chronic kidney disease with stage 1 through stage 4 chronic kidney disease, or unspecified chronic kidney disease: Secondary | ICD-10-CM | POA: Insufficient documentation

## 2014-03-18 DIAGNOSIS — R110 Nausea: Secondary | ICD-10-CM | POA: Insufficient documentation

## 2014-03-18 DIAGNOSIS — Z7902 Long term (current) use of antithrombotics/antiplatelets: Secondary | ICD-10-CM | POA: Insufficient documentation

## 2014-03-18 DIAGNOSIS — Z79899 Other long term (current) drug therapy: Secondary | ICD-10-CM | POA: Insufficient documentation

## 2014-03-18 DIAGNOSIS — Z87448 Personal history of other diseases of urinary system: Secondary | ICD-10-CM | POA: Insufficient documentation

## 2014-03-18 DIAGNOSIS — R42 Dizziness and giddiness: Secondary | ICD-10-CM | POA: Insufficient documentation

## 2014-03-18 DIAGNOSIS — Z7982 Long term (current) use of aspirin: Secondary | ICD-10-CM | POA: Insufficient documentation

## 2014-03-18 DIAGNOSIS — E785 Hyperlipidemia, unspecified: Secondary | ICD-10-CM | POA: Insufficient documentation

## 2014-03-18 DIAGNOSIS — F17200 Nicotine dependence, unspecified, uncomplicated: Secondary | ICD-10-CM | POA: Insufficient documentation

## 2014-03-18 DIAGNOSIS — R51 Headache: Secondary | ICD-10-CM | POA: Insufficient documentation

## 2014-03-18 DIAGNOSIS — I2510 Atherosclerotic heart disease of native coronary artery without angina pectoris: Secondary | ICD-10-CM | POA: Insufficient documentation

## 2014-03-18 DIAGNOSIS — N183 Chronic kidney disease, stage 3 unspecified: Secondary | ICD-10-CM | POA: Insufficient documentation

## 2014-03-18 DIAGNOSIS — R0602 Shortness of breath: Secondary | ICD-10-CM | POA: Insufficient documentation

## 2014-03-18 DIAGNOSIS — Z9861 Coronary angioplasty status: Secondary | ICD-10-CM | POA: Insufficient documentation

## 2014-03-18 DIAGNOSIS — Z872 Personal history of diseases of the skin and subcutaneous tissue: Secondary | ICD-10-CM | POA: Insufficient documentation

## 2014-03-18 LAB — URINALYSIS, ROUTINE W REFLEX MICROSCOPIC
Bilirubin Urine: NEGATIVE
Glucose, UA: NEGATIVE mg/dL
Ketones, ur: NEGATIVE mg/dL
Leukocytes, UA: NEGATIVE
Nitrite: NEGATIVE
Protein, ur: >300 mg/dL — AB
Specific Gravity, Urine: 1.01 (ref 1.005–1.030)
Urobilinogen, UA: 0.2 mg/dL (ref 0.0–1.0)
pH: 6.5 (ref 5.0–8.0)

## 2014-03-18 LAB — BASIC METABOLIC PANEL
BUN: 22 mg/dL (ref 6–23)
CO2: 22 mEq/L (ref 19–32)
Calcium: 8.6 mg/dL (ref 8.4–10.5)
Chloride: 103 mEq/L (ref 96–112)
Creatinine, Ser: 2.34 mg/dL — ABNORMAL HIGH (ref 0.50–1.35)
GFR calc Af Amer: 38 mL/min — ABNORMAL LOW (ref >90)
GFR calc non Af Amer: 33 mL/min — ABNORMAL LOW (ref >90)
Glucose, Bld: 99 mg/dL (ref 70–99)
Potassium: 4 mEq/L (ref 3.7–5.3)
Sodium: 138 mEq/L (ref 137–147)

## 2014-03-18 LAB — CBC
HCT: 40.9 % (ref 39.0–52.0)
Hemoglobin: 14 g/dL (ref 13.0–17.0)
MCH: 32.1 pg (ref 26.0–34.0)
MCHC: 34.2 g/dL (ref 30.0–36.0)
MCV: 93.8 fL (ref 78.0–100.0)
Platelets: 231 10*3/uL (ref 150–400)
RBC: 4.36 MIL/uL (ref 4.22–5.81)
RDW: 13 % (ref 11.5–15.5)
WBC: 11 10*3/uL — ABNORMAL HIGH (ref 4.0–10.5)

## 2014-03-18 LAB — URINE MICROSCOPIC-ADD ON

## 2014-03-18 NOTE — Discharge Instructions (Signed)

## 2014-03-18 NOTE — ED Provider Notes (Signed)
CSN: [id number]     Arrival date & time 03/18/14  0033 History  This chart was scribed for Teressa Lower, MD by Erling Conte, ED Scribe. This patient was seen in room APA16A/APA16A and the patient's care was started at 12:50 AM.   Chief Complaint  Patient presents with  . Dizziness    Patient is a 43 y.o. male presenting with dizziness. The history is provided by the patient. No language interpreter was used.  Dizziness Quality:  Room spinning Severity:  Moderate Onset quality:  Gradual Duration:  1 week Timing:  Intermittent Progression:  Unchanged Chronicity:  New Context: not with loss of consciousness   Relieved by:  None tried Worsened by:  Nothing tried Ineffective treatments:  None tried Associated symptoms: headaches, nausea and shortness of breath (at baseline)   Associated symptoms: no chest pain, no syncope and no vision changes   Risk factors: no new medications     HPI Comments: Wayne Spencer is a 42 y.o. male with history of stage 3 kidney failure who presents to the Emergency Department complaining of intermittent, episodic, "room spinning" dizziness that began last week. He states that he has had episodes started 8 days ago, 6 days ago and tonight (1.5 hours ago). He states that tonight's episode lasted 3-4 minutes. He states that his symptoms have fully resolved and that he is "feeling fine now". Patient states that during the episodic dizziness he experienced a headache, nausea and difficulty walking. Patient denies starting any new medications or recent changes in dosage of medication. Patient denies taking any Nitroglycerin tonight. He admits to experiencing recent dysuria. He states that he has some shortness of breath at baseline with no recent changes. Patient denies any associated chest pain or syncope. Patient has no history of DM.  PCP: Dr. Marjean Donna  Past Medical History  Diagnosis Date  . Cellulitis   . Hypertension   . Nephrotic syndrome   .  Hyperlipidemia   . Tobacco abuse   . Chronic headache   . CAD (coronary artery disease)    Past Surgical History  Procedure Laterality Date  . Coronary stent placement     Family History  Problem Relation Age of Onset  . Heart attack Father     34s  . Heart attack Brother     50s, s/p CABG, passed from MI at 88  . Heart attack Sister     51s   History  Substance Use Topics  . Smoking status: Current Every Day Smoker -- 1.00 packs/day for 6 years    Types: Cigarettes  . Smokeless tobacco: Not on file  . Alcohol Use: Yes     Comment: occasional    Review of Systems  Respiratory: Positive for shortness of breath (at baseline).   Cardiovascular: Negative for chest pain and syncope.  Gastrointestinal: Positive for nausea.  Musculoskeletal: Positive for gait problem.  Neurological: Positive for dizziness and headaches. Negative for syncope.  All other systems reviewed and are negative.     Allergies  Mobic and Tramadol  Home Medications   Prior to Admission medications   Medication Sig Start Date End Date Taking? Authorizing Provider  ALPRAZolam Duanne Moron) 0.5 MG tablet Take 0.5 mg by mouth every 4 (four) hours as needed for anxiety.     Historical Provider, MD  amLODipine (NORVASC) 5 MG tablet Take 1 tablet (5 mg total) by mouth daily. 01/09/14   Arnoldo Lenis, MD  aspirin EC 81 MG EC tablet Take 1  tablet (81 mg total) by mouth daily. 08/12/13   Rhonda G Barrett, PA-C  atorvastatin (LIPITOR) 80 MG tablet Take 1 tablet (80 mg total) by mouth daily. 08/12/13   Rhonda G Barrett, PA-C  clopidogrel (PLAVIX) 75 MG tablet Take 1 tablet (75 mg total) by mouth daily. 08/12/13   Rhonda G Barrett, PA-C  cyclobenzaprine (FLEXERIL) 10 MG tablet Take 10 mg by mouth 3 (three) times daily as needed for muscle spasms.    Historical Provider, MD  furosemide (LASIX) 40 MG tablet Take 40 mg by mouth every morning.     Historical Provider, MD  hydrALAZINE (APRESOLINE) 50 MG tablet Take 50 mg by  mouth 3 (three) times daily. Recently increased by Dr. Elmarie Shiley (kidney MD).    Historical Provider, MD  HYDROcodone-acetaminophen (NORCO) 7.5-325 MG per tablet Take 1 tablet by mouth every 4 (four) hours as needed for pain.    Historical Provider, MD  metoprolol (LOPRESSOR) 50 MG tablet Take 100 mg by mouth 2 (two) times daily. Recently increased by Dr. Elmarie Shiley (kidney MD).    Historical Provider, MD  nitroGLYCERIN (NITROSTAT) 0.4 MG SL tablet Place 1 tablet (0.4 mg total) under the tongue every 5 (five) minutes as needed for chest pain. 08/12/13   Rhonda G Barrett, PA-C  potassium chloride SA (K-DUR,KLOR-CON) 20 MEQ tablet Take 20 mEq by mouth 2 (two) times daily.     Historical Provider, MD   Triage Vitals: BP 127/78  Pulse 78  Temp(Src) 98.3 F (36.8 C) (Oral)  Resp 20  Ht 6\' 1"  (1.854 m)  Wt 270 lb (122.471 kg)  BMI 35.63 kg/m2  SpO2 96%  Physical Exam  Nursing note and vitals reviewed. Constitutional: He is oriented to person, place, and time. He appears well-developed and well-nourished. No distress.  HENT:  Head: Normocephalic and atraumatic.  Eyes: EOM are normal.  No nystagmus  Neck: Neck supple. No tracheal deviation present.  Cardiovascular: Normal rate and regular rhythm.   Pulmonary/Chest: Effort normal and breath sounds normal. No respiratory distress. He has no wheezes. He has no rales.  Lungs CTA  Musculoskeletal: Normal range of motion.  Neurological: He is alert and oriented to person, place, and time.  No pronator drift. Normal gait  Skin: Skin is warm and dry.  Psychiatric: He has a normal mood and affect. His behavior is normal.    ED Course  Procedures (including critical care time)  DIAGNOSTIC STUDIES: Oxygen Saturation is 96% on RA, adequate by my interpretation.    COORDINATION OF CARE:  12:56 Discussed plan to obtain diagnostic labs. Pt advised of plan for treatment and pt agrees.    Labs Review Labs Reviewed  CBC - Abnormal; Notable for  the following:    WBC 11.0 (*)    All other components within normal limits  BASIC METABOLIC PANEL - Abnormal; Notable for the following:    Creatinine, Ser 2.34 (*)    GFR calc non Af Amer 33 (*)    GFR calc Af Amer 38 (*)    All other components within normal limits  URINALYSIS, ROUTINE W REFLEX MICROSCOPIC - Abnormal; Notable for the following:    Hgb urine dipstick TRACE (*)    Protein, ur >300 (*)    All other components within normal limits  URINE MICROSCOPIC-ADD ON   2:56 AM on recheck, remains asymptomatic in the ER. Ambulates with normal gait. Patient requesting discharge and he agrees to return precautions. He will continue medications as prescribed.  Plan discharge home with close primary care followup.  MDM   Diagnosis: Dizziness  Intermittent brief episodes of dizziness lasting seconds. No new medications. No neurologic deficits. No syncope. No history of diabetes. Labs and urinalysis as above, baseline creatinine without any sig abnormalities otherwise. Stable and appropriate for discharge and further outpatient workup. Vital signs and nursing notes reviewed and considered.  I personally performed the services described in this documentation, which was scribed in my presence. The recorded information has been reviewed and is accurate.     Teressa Lower, MD 03/18/14 [phone]

## 2014-03-18 NOTE — ED Notes (Signed)
Pt c/o intermittent dizziness since last Tuesday.

## 2014-03-23 ENCOUNTER — Ambulatory Visit (INDEPENDENT_AMBULATORY_CARE_PROVIDER_SITE_OTHER): Payer: Self-pay | Admitting: Cardiology

## 2014-03-23 ENCOUNTER — Encounter: Payer: Self-pay | Admitting: Cardiology

## 2014-03-23 VITALS — BP 127/76 | HR 70 | Ht 73.0 in | Wt 276.0 lb

## 2014-03-23 DIAGNOSIS — R42 Dizziness and giddiness: Secondary | ICD-10-CM
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
## 2014-03-23 NOTE — Patient Instructions (Signed)
Your physician recommends that you schedule a follow-up appointment in: 3 weeks   Your physician has recommended that you wear an event monitor for 14 days. Event monitors are medical devices that record the heart's electrical activity. Doctors most often [place] these monitors to diagnose arrhythmias. Arrhythmias are problems with the speed or rhythm of the heartbeat. The monitor is a small, portable device. You can wear one while you do your normal daily activities. This is usually used to diagnose what is causing palpitations/syncope (passing out).  Your physician recommends that you continue on your current medications as directed. Please refer to the Current Medication list given to you today.   Thank you for choosing [facility] !

## 2014-03-28 DIAGNOSIS — R42 Dizziness and giddiness: Secondary | ICD-10-CM

## 2014-03-28 NOTE — Addendum Note (Signed)
Addended by: Barbarann Ehlers A on: 03/28/2014 10:01 AM   Modules accepted: Orders

## 2014-04-07 ENCOUNTER — Telehealth: Payer: Self-pay | Admitting: Cardiology

## 2014-04-07 NOTE — Telephone Encounter (Signed)
Pt's Wife Wayne Spencer stated that pt is irritated with the cardiac monitor. The leads would not stick. Pt called to get more from [facility] and now they are sticking well. Pt is having trouble sleeping with the monitor, I told Colette to put the event monitor in a sock and put up underneath a pillow. Colette stated she will try this. Colette also states the pt took off the monitor on 5-6  and will put it back on Sunday. (They made [facility] aware of this) I told the wife to make sure he will wear it for the 14 days. Started on the 28th of April, Stopped on May 6th, Told him to wear it til the 14th of May.

## 2014-04-07 NOTE — Telephone Encounter (Signed)
Please call patient's wife regarding patient's event monitor / tgs

## 2014-04-10 ENCOUNTER — Telehealth: Payer: Self-pay | Admitting: Cardiovascular Disease

## 2014-04-10 ENCOUNTER — Encounter: Payer: Self-pay | Admitting: *Deleted

## 2014-04-10 NOTE — Telephone Encounter (Signed)
Wife states [facility] called her and told her they could mail monitor back.Patient was unable to keep electrodes on due to sweating.They have fu apt in 1 week with Dr.Branch

## 2014-04-10 NOTE — Telephone Encounter (Signed)
Patient's wife would like return phone call / tgs

## 2014-04-17 ENCOUNTER — Encounter: Payer: Self-pay | Admitting: Cardiology

## 2014-04-17 NOTE — Progress Notes (Signed)
Clinical Summary Wayne Spencer is a 43 y.o.male seen today for follow up of the following medical problems.   1. Dizziness  - dizziness started approx 1 week ago. Occurred while driving, he reports he had been sitting in the car for sometime. Felt lightheaded, vision becamse fuzzy. No palpitaitons, No chest pain. Symptoms lasted for approx 5 minutes. No prior episodes. Had repeat episode few days later while sitting in living room after standing to walk to his bed. Felt lightheaded, sweaty. No palpitations, no chest pain at that time. 3rd episode that was more mild.  - metoprolol 100mg  bid recently increased. Has noticed high blood pressures at home  - he was seen in ER 03/18/14 with these symptoms with negative workup, he was discharged that day.   - had trouble with compliance with event monitor  Past Medical History  Diagnosis Date  . Cellulitis   . Hypertension   . Nephrotic syndrome   . Hyperlipidemia   . Tobacco abuse   . Chronic headache   . CAD (coronary artery disease)      Allergies  Allergen Reactions  . Mobic [Meloxicam]     Unknown   . Tramadol     REACTION: itching, breaking out.     Current Outpatient Prescriptions  Medication Sig Dispense Refill  . ALPRAZolam (XANAX) 0.5 MG tablet Take 0.5 mg by mouth every 4 (four) hours as needed for anxiety.       Marland Kitchen amLODipine (NORVASC) 5 MG tablet Take 1 tablet (5 mg total) by mouth daily.  30 tablet  6  . aspirin EC 81 MG EC tablet Take 1 tablet (81 mg total) by mouth daily.      Marland Kitchen atorvastatin (LIPITOR) 80 MG tablet Take 1 tablet (80 mg total) by mouth daily.  30 tablet  11  . clopidogrel (PLAVIX) 75 MG tablet Take 1 tablet (75 mg total) by mouth daily.  30 tablet  11  . cyclobenzaprine (FLEXERIL) 10 MG tablet Take 10 mg by mouth 3 (three) times daily as needed for muscle spasms.      . furosemide (LASIX) 40 MG tablet Take 40 mg by mouth every morning.       . hydrALAZINE (APRESOLINE) 50 MG tablet Take 50 mg by mouth  3 (three) times daily. Recently increased by Dr. Elmarie Shiley (kidney MD).      Marland Kitchen HYDROcodone-acetaminophen (NORCO) 7.5-325 MG per tablet Take 1 tablet by mouth every 4 (four) hours as needed for pain.      . metoprolol (LOPRESSOR) 50 MG tablet Take 100 mg by mouth 2 (two) times daily. Recently increased by Dr. Elmarie Shiley (kidney MD).      . nitroGLYCERIN (NITROSTAT) 0.4 MG SL tablet Place 1 tablet (0.4 mg total) under the tongue every 5 (five) minutes as needed for chest pain.  25 tablet  3  . potassium chloride SA (K-DUR,KLOR-CON) 20 MEQ tablet Take 20 mEq by mouth 2 (two) times daily.        No current facility-administered medications for this visit.     Past Surgical History  Procedure Laterality Date  . Coronary stent placement       Allergies  Allergen Reactions  . Mobic [Meloxicam]     Unknown   . Tramadol     REACTION: itching, breaking out.      Family History  Problem Relation Age of Onset  . Heart attack Father     15s  . Heart attack Brother  33s, s/p CABG, passed from MI at 79  . Heart attack Sister     55s     Social History Wayne Spencer reports that he has been smoking Cigarettes.  He has a 6 pack-year smoking history. He does not have any smokeless tobacco history on file. Wayne Spencer reports that he drinks alcohol.   Review of Systems CONSTITUTIONAL: No weight loss, fever, chills, weakness or fatigue.  HEENT: Eyes: No visual loss, blurred vision, double vision or yellow sclerae.No hearing loss, sneezing, congestion, runny nose or sore throat.  SKIN: No rash or itching.  CARDIOVASCULAR:  RESPIRATORY: No shortness of breath, cough or sputum.  GASTROINTESTINAL: No anorexia, nausea, vomiting or diarrhea. No abdominal pain or blood.  GENITOURINARY: No burning on urination, no polyuria NEUROLOGICAL: No headache, dizziness, syncope, paralysis, ataxia, numbness or tingling in the extremities. No change in bowel or bladder control.  MUSCULOSKELETAL: No muscle,  back pain, joint pain or stiffness.  LYMPHATICS: No enlarged nodes. No history of splenectomy.  PSYCHIATRIC: No history of depression or anxiety.  ENDOCRINOLOGIC: No reports of sweating, cold or heat intolerance. No polyuria or polydipsia.  Marland Kitchen   Physical Examination There were no vitals filed for this visit. There were no vitals filed for this visit.  Gen: resting comfortably, no acute distress HEENT: no scleral icterus, pupils equal round and reactive, no palptable cervical adenopathy,  CV Resp: Clear to auscultation bilaterally GI: abdomen is soft, non-tender, non-distended, normal bowel sounds, no hepatosplenomegaly MSK: extremities are warm, no edema.  Skin: warm, no rash Neuro:  no focal deficits Psych: appropriate affect   Diagnostic Studies  08/11/13 Cath  Hemodynamics:  LV pressure: 156/25  Aortic pressure: 153/96  Angiography  Left Main: large normal  Left anterior Descending: large, mild - moderate irregularlites. The 1st diag is a small - moderate sized vessel with a 90% stenosis in the proximal segment  Left Circumflex: moderate - large vessel. Minor luminal irreg.  Right Coronary Artery: large and dominant. Occluded before the bifurcation . The distal RCA can be seen filling from collaterals from the left system  LV Gram: not performed. The AV was crossed for pressures.  Complications: No apparent complications  Patient did tolerate procedure well.  Contrast used: 36 cc contrast  Conclusions:  1. CAD : his RCA is occluded and fills via collaterals. His 1st diag has a tight stenosis but is probably too small to attempt PCI given his tenuous renal status.  Lesion Data:  Vessel: RCA/distal  Percent stenosis (pre): 99  TIMI-flow (pre): 1  Stent: 4.0x38 mm DES  Percent stenosis (post): 0  TIMI-flow (post): 3  Conclusions: Successful PCI of the distal RCA  Recommendations: ASA/plavix at least 12 months. Integrelin 12 hours.   08/2013 Echo  LVEF 45-50%, inferior  hypokinesis, mild LAE   Clinic EKG 01/09/14: Bigeminy       Assessment and Plan   1. Dizziness  - unclear etiology at this time, symptoms can occur with sitting or standing  - negative orthostatics in office today  - hx of CAD with prior stent to RCA, last known LVEF was 45-50% in 2014. Combined with bigeminy/ventricular ectopy noted on prior ekg 01/10/14 concern for possible arrhythmia as etiology  - will obtain 14 day event monitor, have also asked patient to keep bp log.         Arnoldo Lenis, M.D., F.A.C.C.

## 2014-04-18 ENCOUNTER — Other Ambulatory Visit: Payer: Self-pay | Admitting: *Deleted

## 2014-04-18 DIAGNOSIS — R42 Dizziness and giddiness: Secondary | ICD-10-CM

## 2014-05-01 NOTE — Progress Notes (Signed)
This encounter was created in error - please disregard.

## 2014-05-16 ENCOUNTER — Other Ambulatory Visit (HOSPITAL_COMMUNITY): Payer: Self-pay | Admitting: Family Medicine

## 2014-05-16 ENCOUNTER — Ambulatory Visit (HOSPITAL_COMMUNITY)
Admission: RE | Admit: 2014-05-16 | Discharge: 2014-05-16 | Disposition: A | Discharge status: Home / Self Care | Payer: Self-pay | Source: Ambulatory Visit | Attending: Family Medicine | Admitting: Family Medicine

## 2014-05-16 DIAGNOSIS — M545 Low back pain, unspecified: Secondary | ICD-10-CM

## 2014-05-16 DIAGNOSIS — M25559 Pain in unspecified hip: Secondary | ICD-10-CM

## 2014-05-24 ENCOUNTER — Other Ambulatory Visit (HOSPITAL_COMMUNITY): Payer: Self-pay | Admitting: Family Medicine

## 2014-05-24 DIAGNOSIS — M545 Low back pain: Secondary | ICD-10-CM

## 2014-05-29 ENCOUNTER — Ambulatory Visit (INDEPENDENT_AMBULATORY_CARE_PROVIDER_SITE_OTHER): Payer: Self-pay | Admitting: Cardiology

## 2014-05-29 ENCOUNTER — Encounter: Payer: Self-pay | Admitting: Cardiology

## 2014-05-29 VITALS — BP 124/64 | HR 84 | Ht 73.0 in | Wt 280.0 lb

## 2014-05-29 DIAGNOSIS — I10 Essential (primary) hypertension: Secondary | ICD-10-CM

## 2014-05-29 DIAGNOSIS — R0602 Shortness of breath: Secondary | ICD-10-CM

## 2014-05-29 DIAGNOSIS — F17200 Nicotine dependence, unspecified, uncomplicated: Secondary | ICD-10-CM

## 2014-05-29 DIAGNOSIS — R42 Dizziness and giddiness: Secondary | ICD-10-CM

## 2014-05-29 DIAGNOSIS — I2510 Atherosclerotic heart disease of native coronary artery without angina pectoris: Secondary | ICD-10-CM

## 2014-05-29 DIAGNOSIS — Z720 Tobacco use: Secondary | ICD-10-CM

## 2014-05-29 NOTE — Progress Notes (Signed)
Patient ID: Wayne Spencer, male   DOB: 1971-07-11, 43 y.o.   MRN: [id number]     Clinical Summary [name] is a 43 y.o.male seen today for follow up.   1. CAD  - prior NSTEMI 08/2013, DES to RCA.  - 08/2013 Echo: LVEF 45-50%, hypokinesis inferior wall  - not on ACE due to renal function  - no significant chest pain. + DOE < 1/block which is stable - compliant with meds   2. CKD  - followed by kidney doctors, Dr. Clover Spencer   3. Tobacco abuse  - 1 ppd, has not tried nicotine in the past before. Tobacco x 30 years + wheezing at times, cough  4. HTN  - does not check regularly at home  - compliant with meds   5. Hyperlipidemia  - compliant with atorvastatin 80mg , last visit tried to change to crestor however patient was not able to afford.  - 01/2014: TC 172 TG 197 HDL 24 LDL 109  6. Dizziness  - resolved, monitor did not show any significant events. He did have trouble keeping on due to stickers not staying on as well as discomfort.  Past Medical History  Diagnosis Date  . Cellulitis   . Hypertension   . Nephrotic syndrome   . Hyperlipidemia   . Tobacco abuse   . Chronic headache   . CAD (coronary artery disease)      Allergies  Allergen Reactions  . Mobic [Meloxicam]     Unknown   . Tramadol     REACTION: itching, breaking out.     Current Outpatient Prescriptions  Medication Sig Dispense Refill  . ALPRAZolam (XANAX) 0.5 MG tablet Take 0.5 mg by mouth every 4 (four) hours as needed for anxiety.       Marland Kitchen amLODipine (NORVASC) 5 MG tablet Take 1 tablet (5 mg total) by mouth daily.  30 tablet  6  . aspirin EC 81 MG EC tablet Take 1 tablet (81 mg total) by mouth daily.      Marland Kitchen atorvastatin (LIPITOR) 80 MG tablet Take 1 tablet (80 mg total) by mouth daily.  30 tablet  11  . clopidogrel (PLAVIX) 75 MG tablet Take 1 tablet (75 mg total) by mouth daily.  30 tablet  11  . cyclobenzaprine (FLEXERIL) 10 MG tablet Take 10 mg by mouth 3 (three) times daily as needed for  muscle spasms.      . furosemide (LASIX) 40 MG tablet Take 40 mg by mouth every morning.       . hydrALAZINE (APRESOLINE) 50 MG tablet Take 50 mg by mouth 3 (three) times daily. Recently increased by Dr. Elmarie Spencer (kidney MD).      Marland Kitchen HYDROcodone-acetaminophen (NORCO) 7.5-325 MG per tablet Take 1 tablet by mouth every 4 (four) hours as needed for pain.      . metoprolol (LOPRESSOR) 50 MG tablet Take 100 mg by mouth 2 (two) times daily. Recently increased by Dr. Elmarie Spencer (kidney MD).      . nitroGLYCERIN (NITROSTAT) 0.4 MG SL tablet Place 1 tablet (0.4 mg total) under the tongue every 5 (five) minutes as needed for chest pain.  25 tablet  3  . potassium chloride SA (K-DUR,KLOR-CON) 20 MEQ tablet Take 20 mEq by mouth 2 (two) times daily.        No current facility-administered medications for this visit.     Past Surgical History  Procedure Laterality Date  . Coronary stent placement  Allergies  Allergen Reactions  . Mobic [Meloxicam]     Unknown   . Tramadol     REACTION: itching, breaking out.      Family History  Problem Relation Age of Onset  . Heart attack Father     56s  . Heart attack Brother     80s, s/p CABG, passed from MI at 25  . Heart attack Sister     55s     Social History [name] reports that he has been smoking Cigarettes.  He has a 6 pack-year smoking history. He does not have any smokeless tobacco history on file. [name] reports that he drinks alcohol.   Review of Systems CONSTITUTIONAL: No weight loss, fever, chills, weakness or fatigue.  HEENT: Eyes: No visual loss, blurred vision, double vision or yellow sclerae.No hearing loss, sneezing, congestion, runny nose or sore throat.  SKIN: No rash or itching.  CARDIOVASCULAR: per HPI RESPIRATORY: No shortness of breath, cough or sputum.  GASTROINTESTINAL: No anorexia, nausea, vomiting or diarrhea. No abdominal pain or blood.  GENITOURINARY: No burning on urination, no  polyuria NEUROLOGICAL: No headache, dizziness, syncope, paralysis, ataxia, numbness or tingling in the extremities. No change in bowel or bladder control.  MUSCULOSKELETAL: No muscle, back pain, joint pain or stiffness.  LYMPHATICS: No enlarged nodes. No history of splenectomy.  PSYCHIATRIC: No history of depression or anxiety.  ENDOCRINOLOGIC: No reports of sweating, cold or heat intolerance. No polyuria or polydipsia.  Marland Kitchen   Physical Examination p 84 bp 124/64 Wt 280 lbs BMI 37 Gen: resting comfortably, no acute distress HEENT: no scleral icterus, pupils equal round and reactive, no palptable cervical adenopathy,  CV: RRR, no m/r/g, no JVD, no carotid bruits Resp: Clear to auscultation bilaterally GI: abdomen is soft, non-tender, non-distended, normal bowel sounds, no hepatosplenomegaly MSK: extremities are warm, no edema.  Skin: warm, no rash Neuro:  no focal deficits Psych: appropriate affect   Diagnostic Studies 08/11/13 Cath  Hemodynamics:  LV pressure: 156/25  Aortic pressure: 153/96  Angiography  Left Main: large normal  Left anterior Descending: large, mild - moderate irregularlites. The 1st diag is a small - moderate sized vessel with a 90% stenosis in the proximal segment  Left Circumflex: moderate - large vessel. Minor luminal irreg.  Right Coronary Artery: large and dominant. Occluded before the bifurcation . The distal RCA can be seen filling from collaterals from the left system  LV Gram: not performed. The AV was crossed for pressures.  Complications: No apparent complications  Patient did tolerate procedure well.  Contrast used: 36 cc contrast  Conclusions:  1. CAD : his RCA is occluded and fills via collaterals. His 1st diag has a tight stenosis but is probably too small to attempt PCI given his tenuous renal status.  Lesion Data:  Vessel: RCA/distal  Percent stenosis (pre): 99  TIMI-flow (pre): 1  Stent: 4.0x38 mm DES  Percent stenosis (post): 0  TIMI-flow  (post): 3  Conclusions: Successful PCI of the distal RCA  Recommendations: ASA/plavix at least 12 months. Integrelin 12 hours.   08/2013 Echo  LVEF 45-50%, inferior hypokinesis, mild LAE   Clinic EKG 01/09/14: Bigeminy        Assessment and Plan  1. CAD  - denies any current symptoms, continue risk factor modification and secondary prevention - continue plavix at least until 08/2014  2. CKD  - followed by nephrology  3. Tobacco abuse  - still smoking 1 pack per day, educated on health risks.  -  reports chronic DOE, wheezing and cough. Will obtain PFTs.   4. HTN  - at goal, continue current meds  5. Hyperlipidemia  - lipids not at goal, he was unable to afford crestor. Continue lipitor 80mg  daily   6. Dizziness - no significant arrhythmias on monitor, though he had trouble keeping on - symptoms have resolved, no further work up at this time.         Arnoldo Lenis, M.D., F.A.C.C.

## 2014-05-29 NOTE — Patient Instructions (Signed)
Your physician recommends that you schedule a follow-up appointment in: 3 months     Your physician recommends that you continue on your current medications as directed. Please refer to the Current Medication list given to you today.    Your physician has recommended that you have a pulmonary function test. Pulmonary Function Tests are a group of tests that measure how well air moves in and out of your lungs.    Thank you for choosing [facility] !

## 2014-05-30 ENCOUNTER — Ambulatory Visit (HOSPITAL_COMMUNITY)
Admission: RE | Admit: 2014-05-30 | Discharge: 2014-05-30 | Disposition: A | Discharge status: Home / Self Care | Payer: Self-pay | Source: Ambulatory Visit | Attending: Family Medicine | Admitting: Family Medicine

## 2014-05-30 ENCOUNTER — Encounter (HOSPITAL_COMMUNITY): Payer: Self-pay

## 2014-05-30 DIAGNOSIS — M545 Low back pain, unspecified: Secondary | ICD-10-CM | POA: Insufficient documentation

## 2014-05-30 DIAGNOSIS — M5126 Other intervertebral disc displacement, lumbar region: Secondary | ICD-10-CM | POA: Insufficient documentation

## 2014-05-30 DIAGNOSIS — M51379 Other intervertebral disc degeneration, lumbosacral region without mention of lumbar back pain or lower extremity pain: Secondary | ICD-10-CM | POA: Insufficient documentation

## 2014-05-30 DIAGNOSIS — R51 Headache: Secondary | ICD-10-CM | POA: Insufficient documentation

## 2014-05-30 DIAGNOSIS — M5137 Other intervertebral disc degeneration, lumbosacral region: Secondary | ICD-10-CM | POA: Insufficient documentation

## 2014-05-30 DIAGNOSIS — I7090 Unspecified atherosclerosis: Secondary | ICD-10-CM | POA: Insufficient documentation

## 2014-05-31 ENCOUNTER — Encounter: Payer: Self-pay | Admitting: Cardiology

## 2014-09-18 ENCOUNTER — Encounter: Payer: Self-pay | Admitting: Adult Health

## 2014-09-18 ENCOUNTER — Ambulatory Visit (INDEPENDENT_AMBULATORY_CARE_PROVIDER_SITE_OTHER): Payer: Self-pay | Admitting: Adult Health

## 2014-09-18 VITALS — BP 110/70 | HR 68 | Ht 73.0 in | Wt 272.0 lb

## 2014-09-18 DIAGNOSIS — Z720 Tobacco use: Secondary | ICD-10-CM

## 2014-09-18 DIAGNOSIS — I10 Essential (primary) hypertension: Secondary | ICD-10-CM

## 2014-09-18 DIAGNOSIS — I2510 Atherosclerotic heart disease of native coronary artery without angina pectoris: Secondary | ICD-10-CM

## 2014-09-18 MED ORDER — NITROGLYCERIN 0.4 MG SL SUBL: 0.4000 mg | SUBLINGUAL_TABLET | SUBLINGUAL | Status: DC | PRN

## 2014-09-18 NOTE — Assessment & Plan Note (Signed)
I have asked him to quit smoking. He is cutting down. I have stressed the importance of cessation with known CAD and high probability of progressive CAD. He verbalizes understanding.

## 2014-09-18 NOTE — Progress Notes (Signed)
HPI: [name] is a 43 year old patient of Dr. Harl Bowie. We are following for ongoing assessment and management of coronary artery disease, with history of non-ST elevation MI in September 2014 with drug-eluting stent to the right coronary artery, not on ACE  Due to renal function. Ongoing tobacco abuse, hypertension, and hyperlipidemia. The patient was last seen by Dr. Harl Bowie in June of 2015. On last office visit the patient was asymptomatic and stable from a cardiovascular standpoint. He was encouraged on smoking cessation.  No new symptoms with the exception of mild chest discomfort. Not always associated with exertion. He unfortunately continues to smoke. He is now followed by Dr. Luan Pulling as his PCP.   Allergies  Allergen Reactions  . Mobic [Meloxicam]     Unknown   . Tramadol     REACTION: itching, breaking out.    Current Outpatient Prescriptions  Medication Sig Dispense Refill  . ALPRAZolam (XANAX) 1 MG tablet Take 1 mg by mouth 3 (three) times daily as needed for anxiety.      Marland Kitchen amLODipine (NORVASC) 5 MG tablet Take 1 tablet (5 mg total) by mouth daily.  30 tablet  6  . aspirin EC 81 MG EC tablet Take 1 tablet (81 mg total) by mouth daily.      Marland Kitchen atorvastatin (LIPITOR) 80 MG tablet Take 1 tablet (80 mg total) by mouth daily.  30 tablet  11  . clopidogrel (PLAVIX) 75 MG tablet Take 1 tablet (75 mg total) by mouth daily.  30 tablet  11  . cyclobenzaprine (FLEXERIL) 10 MG tablet Take 10 mg by mouth 3 (three) times daily as needed for muscle spasms.      . furosemide (LASIX) 40 MG tablet Take 40 mg by mouth every morning.       . hydrALAZINE (APRESOLINE) 50 MG tablet Take 100 mg by mouth 3 (three) times daily. Recently increased by Dr. Elmarie Shiley (kidney MD).      Marland Kitchen HYDROcodone-acetaminophen (NORCO) 7.5-325 MG per tablet Take 1 tablet by mouth every 4 (four) hours as needed for pain.      . metoprolol (LOPRESSOR) 50 MG tablet Take 100 mg by mouth 2 (two) times daily. Recently increased  by Dr.  Luan Pulling).      . nitroGLYCERIN (NITROSTAT) 0.4 MG SL tablet Place 1 tablet (0.4 mg total) under the tongue every 5 (five) minutes as needed for chest pain.  25 tablet  3  . potassium chloride SA (K-DUR,KLOR-CON) 20 MEQ tablet Take 20 mEq by mouth 2 (two) times daily.        No current facility-administered medications for this visit.    Past Medical History  Diagnosis Date  . Cellulitis   . Hypertension   . Nephrotic syndrome   . Hyperlipidemia   . Tobacco abuse   . Chronic headache   . CAD (coronary artery disease)     Past Surgical History  Procedure Laterality Date  . Coronary stent placement      ROS: Review of systems complete and found to be negative unless listed above  PHYSICAL EXAM BP 110/70  Pulse 68  Ht 6\' 1"  (1.854 m)  Wt 272 lb (123.378 kg)  BMI 35.89 kg/m2 General: Well developed, well nourished, in no acute distress, obese Head: Eyes PERRLA, Positive for xanthomas.   Normal cephalic and atramatic  Lungs: Clear bilaterally to auscultation and percussion. Heart: HRRR S1 S2, without MRG.  Pulses are 2+ & equal.  No carotid bruit. No JVD.  No abdominal bruits. No femoral bruits. Abdomen: Bowel sounds are positive, abdomen soft and non-tender without masses or                  Hernia's noted. Msk:  Back normal, normal gait. Normal strength and tone for age. Extremities: No clubbing, cyanosis or edema.  DP +1 Neuro: Alert and oriented X 3. Psych:  Good affect, responds appropriately   ASSESSMENT AND PLAN

## 2014-09-18 NOTE — Assessment & Plan Note (Signed)
Currently well controlled. No changes in medications. His medication regimen has been adjusted by Dr. Luan Pulling. Will not make any changes.

## 2014-09-18 NOTE — Assessment & Plan Note (Signed)
He is without complaints of significant chest pain. He has occasional discomfort, but not enough to take NTG. I have asked him to refill his NTG tablet as he has had the same bottle for over 2 years. Risk factor modification is main goal. He is to continue statin, ASA, BB. Labs are being followed by Dr. Luan Pulling. Will see him again in 6 months.

## 2014-09-18 NOTE — Progress Notes (Deleted)
Name: Wayne Spencer    DOB: 27-Jan-1971  Age: 43 y.o.  MR#: [id number]       PCP:  Lanette Hampshire, MD      Insurance: Payor: / No coverage found.  CC:    Chief Complaint  Patient presents with  . Coronary Artery Disease  . Chronic Kidney Disease  . Hypertension    VS Filed Vitals:   09/18/14 1320  BP: 110/70  Pulse: 68  Height: 6\' 1"  (1.854 m)  Weight: 272 lb (123.378 kg)    Weights Current Weight  09/18/14 272 lb (123.378 kg)  05/29/14 280 lb (127.007 kg)  03/23/14 276 lb (125.193 kg)    Blood Pressure  BP Readings from Last 3 Encounters:  09/18/14 110/70  05/29/14 124/64  03/23/14 127/76     Admit date:  (Not on file) Last encounter with RMR:  Visit date not found   Allergy Mobic and Tramadol  Current Outpatient Prescriptions  Medication Sig Dispense Refill  . ALPRAZolam (XANAX) 1 MG tablet Take 1 mg by mouth 3 (three) times daily as needed for anxiety.      Marland Kitchen amLODipine (NORVASC) 5 MG tablet Take 1 tablet (5 mg total) by mouth daily.  30 tablet  6  . aspirin EC 81 MG EC tablet Take 1 tablet (81 mg total) by mouth daily.      Marland Kitchen atorvastatin (LIPITOR) 80 MG tablet Take 1 tablet (80 mg total) by mouth daily.  30 tablet  11  . clopidogrel (PLAVIX) 75 MG tablet Take 1 tablet (75 mg total) by mouth daily.  30 tablet  11  . cyclobenzaprine (FLEXERIL) 10 MG tablet Take 10 mg by mouth 3 (three) times daily as needed for muscle spasms.      . furosemide (LASIX) 40 MG tablet Take 40 mg by mouth every morning.       . hydrALAZINE (APRESOLINE) 50 MG tablet Take 100 mg by mouth 3 (three) times daily. Recently increased by Dr. Elmarie Shiley (kidney MD).      Marland Kitchen HYDROcodone-acetaminophen (NORCO) 7.5-325 MG per tablet Take 1 tablet by mouth every 4 (four) hours as needed for pain.      . metoprolol (LOPRESSOR) 50 MG tablet Take 100 mg by mouth 2 (two) times daily. Recently increased by Dr.  Luan Pulling).      . nitroGLYCERIN (NITROSTAT) 0.4 MG SL tablet Place 1 tablet (0.4 mg total) under  the tongue every 5 (five) minutes as needed for chest pain.  25 tablet  3  . potassium chloride SA (K-DUR,KLOR-CON) 20 MEQ tablet Take 20 mEq by mouth 2 (two) times daily.        No current facility-administered medications for this visit.    Discontinued Meds:    Medications Discontinued During This Encounter  Medication Reason  . ALPRAZolam (XANAX) 0.5 MG tablet Error    Patient Active Problem List   Diagnosis Date Noted  . NSTEMI (non-ST elevated myocardial infarction) 09/05/2013  . CKD (chronic kidney disease) stage 4, GFR 15-29 ml/min 08/11/2013  . Hypokalemia 08/11/2013  . Tobacco abuse   . Hyperlipidemia   . Nephrotic syndrome   . Hypertension   . CAD (coronary artery disease)   . SCIATICA 10/30/2008  . BACK PAIN 10/30/2008    LABS    Component Value Date/Time   NA 138 03/18/2014 0118   NA 138 01/14/2014 1135   NA 135 08/14/2013 2058   K 4.0 03/18/2014 0118   K 4.3 01/14/2014 1135  K 3.1* 08/14/2013 2058   CL 103 03/18/2014 0118   CL 108 01/14/2014 1135   CL 102 08/14/2013 2058   CO2 22 03/18/2014 0118   CO2 23 01/14/2014 1135   CO2 23 08/14/2013 2058   GLUCOSE 99 03/18/2014 0118   GLUCOSE 140* 01/14/2014 1135   GLUCOSE 124* 08/14/2013 2058   BUN 22 03/18/2014 0118   BUN 15 01/14/2014 1135   BUN 15 08/14/2013 2058   CREATININE 2.34* 03/18/2014 0118   CREATININE 2.14* 01/14/2014 1135   CREATININE 2.36* 08/14/2013 2058   CREATININE 1.91* 08/12/2013 0426   CALCIUM 8.6 03/18/2014 0118   CALCIUM 8.5 01/14/2014 1135   CALCIUM 8.8 08/14/2013 2058   GFRNONAA 33* 03/18/2014 0118   GFRNONAA 37* 01/14/2014 1135   GFRNONAA 33* 08/14/2013 2058   GFRNONAA 42* 08/12/2013 0426   GFRAA 38* 03/18/2014 0118   GFRAA 43* 01/14/2014 1135   GFRAA 38* 08/14/2013 2058   GFRAA 49* 08/12/2013 0426   CMP     Component Value Date/Time   NA 138 03/18/2014 0118   K 4.0 03/18/2014 0118   CL 103 03/18/2014 0118   CO2 22 03/18/2014 0118   GLUCOSE 99 03/18/2014 0118   BUN 22 03/18/2014 0118   CREATININE 2.34*  03/18/2014 0118   CREATININE 2.14* 01/14/2014 1135   CALCIUM 8.6 03/18/2014 0118   PROT 5.1* 08/10/2013 0435   ALBUMIN 2.4* 08/12/2013 0426   AST 20 08/10/2013 0435   ALT 12 08/10/2013 0435   ALKPHOS 44 08/10/2013 0435   BILITOT 0.1* 08/10/2013 0435   GFRNONAA 33* 03/18/2014 0118   GFRNONAA 37* 01/14/2014 1135   GFRAA 38* 03/18/2014 0118   GFRAA 43* 01/14/2014 1135       Component Value Date/Time   WBC 11.0* 03/18/2014 0118   WBC 9.6 08/14/2013 2058   WBC 9.6 08/12/2013 0426   HGB 14.0 03/18/2014 0118   HGB 12.8* 08/14/2013 2058   HGB 12.2* 08/12/2013 0426   HCT 40.9 03/18/2014 0118   HCT 36.3* 08/14/2013 2058   HCT 34.0* 08/12/2013 0426   MCV 93.8 03/18/2014 0118   MCV 93.1 08/14/2013 2058   MCV 91.9 08/12/2013 0426    Lipid Panel     Component Value Date/Time   CHOL 172 01/14/2014 1135   TRIG 197* 01/14/2014 1135   HDL 24* 01/14/2014 1135   CHOLHDL 7.2 01/14/2014 1135   VLDL 39 01/14/2014 1135   LDLCALC 109* 01/14/2014 1135    ABG    Component Value Date/Time   TCO2 23 08/19/2009 2045     Lab Results  Component Value Date   TSH 4.001 08/09/2013   BNP (last 3 results) No results found for this basename: PROBNP,  in the last 8760 hours Cardiac Panel (last 3 results) No results found for this basename: CKTOTAL, CKMB, TROPONINI, RELINDX,  in the last 72 hours  Iron/TIBC/Ferritin/ %Sat No results found for this basename: iron, tibc, ferritin, ironpctsat     EKG Orders placed in visit on 04/18/14  . CARDIAC EVENT MONITOR     Prior Assessment and Plan Problem List as of 09/18/2014     Cardiovascular and Mediastinum   Hypertension   Last Assessment & Plan   02/01/2014 Office Visit Written 02/01/2014  4:31 PM by Lendon Colonel, NP     Well-controlled currently. He continues to have some pain which does increase his blood pressure at times. He is medically compliant. He is having some muscle aches and pains, I do not think  that any are related to statin, as he has been on that medication  without other symptoms. He has twisted his back slipping in the snow and ice and mild over the last week. I have advised him to see his primary care physician if the pain is not better and to take his medications as directed. We will see him in 6 months unless he becomes symptomatic    CAD (coronary artery disease)   Last Assessment & Plan   02/01/2014 Office Visit Written 02/01/2014  4:32 PM by Lendon Colonel, NP     He will continue on current medical management to include statin, aspirin, metoprolol, and hopefully increase his activity in order to lose weight. Risk management is planned only without any further testing.    NSTEMI (non-ST elevated myocardial infarction)     Nervous and Auditory   SCIATICA     Genitourinary   Nephrotic syndrome   CKD (chronic kidney disease) stage 4, GFR 15-29 ml/min     Other   BACK PAIN   Last Assessment & Plan   02/01/2014 Office Visit Written 02/01/2014  4:33 PM by Lendon Colonel, NP     Chronic for him. Defer to primary care physician for ongoing management and referral as medically necessary.    Tobacco abuse   Last Assessment & Plan   02/01/2014 Office Visit Written 02/01/2014  4:31 PM by Lendon Colonel, NP     Smoking cessation is discussed. I doubt that he is motivated at this time to stop. He is aware of the risk with known history of CAD.    Hyperlipidemia   Hypokalemia       Imaging: No results found.

## 2014-09-18 NOTE — Patient Instructions (Signed)
Your physician wants you to follow-up in: 6 months You will receive a reminder letter in the mail two months in advance. If you don't receive a letter, please call our office to schedule the follow-up appointment.  Your physician recommends that you continue on your current medications as directed. Please refer to the Current Medication list given to you today.  I have refilled your nitroglycerin   Thank you for choosing [facility]!!

## 2014-11-09 ENCOUNTER — Encounter (HOSPITAL_COMMUNITY): Payer: Self-pay | Admitting: Cardiovascular Disease

## 2015-02-20 ENCOUNTER — Ambulatory Visit (INDEPENDENT_AMBULATORY_CARE_PROVIDER_SITE_OTHER): Payer: Self-pay | Admitting: Adult Health

## 2015-02-20 ENCOUNTER — Encounter: Payer: Self-pay | Admitting: Adult Health

## 2015-02-20 VITALS — BP 128/82 | HR 89 | Ht 75.0 in | Wt 271.0 lb

## 2015-02-20 DIAGNOSIS — Z720 Tobacco use: Secondary | ICD-10-CM

## 2015-02-20 DIAGNOSIS — R0789 Other chest pain: Secondary | ICD-10-CM

## 2015-02-20 DIAGNOSIS — I10 Essential (primary) hypertension: Secondary | ICD-10-CM

## 2015-02-20 DIAGNOSIS — I2510 Atherosclerotic heart disease of native coronary artery without angina pectoris: Secondary | ICD-10-CM

## 2015-02-20 NOTE — Progress Notes (Signed)
Cardiology Office Note   Date:  02/20/2015   ID:  Wayne Spencer, DOB 10-28-1971, MRN [id number]  PCP:  Lanette Hampshire, MD  Cardiologist: Woodroe Chen, NP   Chief Complaint  Patient presents with  . Bradycardia      History of Present Illness: Wayne Spencer is a 44 y.o. male who presents for who presents for ongoing assessment and management of CAD, history of non-ST elevation MI, September 2004 with drug-eluting stents.  The right coronary artery.  Systolic dysfunction, with an echo in 2014 revealing an EF of 45-50% with hypokinesis of the inferior wall.  Hypertension, hyperlipidemia, ongoing tobacco abuse, with chronic kidney disease.  The patient was last seen in the office in June 2015 by Dr. Harl Bowie.  He was continued on current medical therapy with discussion of discontinuing Plavix.  On followup.  He was counseled on smoking cessation.  He comes today with multiple somatic complaints of musculoskeletal etiology.  He states, he feels some spasm in his neck, shoulder, and left pectoral muscle.  He was concerned that was related to his heart disease.  He states it is not severe, but he noticed it most when he is exerting himself, raising his arm above his head, or doing heavy lifting.  He has been seen by Dr. Luan Pulling, who is his new primary care physician, who has prescribed him Flexeril, and continued alprazolam.  He would like to do an MRI however, the patient refuses knowing that he had stent placements.  The patient reportedly continues to smoke.  He states he smokes a cigarette, anywhere from 30 minutes to an hour apart.  He states he is trying to cut down and as he does so.  He begins to have more discomfort in his chest.  He also complains of worsening headaches.  He states he is unable to work due to his chronic musculoskeletal pain and headaches.  He is going to talk to Dr. Luan Pulling, about disability.   Past Medical History  Diagnosis Date  . Cellulitis   .  Hypertension   . Nephrotic syndrome   . Hyperlipidemia   . Tobacco abuse   . Chronic headache   . CAD (coronary artery disease)     Past Surgical History  Procedure Laterality Date  . Coronary stent placement    . Left heart catheterization with coronary angiogram N/A 08/10/2013    Procedure: LEFT HEART CATHETERIZATION WITH CORONARY ANGIOGRAM;  Surgeon: [name], MD;  Location: [facility] CATH LAB;  Service: Cardiovascular;  Laterality: N/A;  . Percutaneous coronary stent intervention (pci-s) N/A 08/11/2013    Procedure: PERCUTANEOUS CORONARY STENT INTERVENTION (PCI-S);  Surgeon: Blane Ohara, MD;  Location: [facility] CATH LAB;  Service: Cardiovascular;  Laterality: N/A;     Current Outpatient Prescriptions  Medication Sig Dispense Refill  . ALPRAZolam (XANAX) 1 MG tablet Take 1 mg by mouth 3 (three) times daily as needed for anxiety.    Marland Kitchen amLODipine (NORVASC) 5 MG tablet Take 1 tablet (5 mg total) by mouth daily. 30 tablet 6  . aspirin EC 81 MG EC tablet Take 1 tablet (81 mg total) by mouth daily.    Marland Kitchen atorvastatin (LIPITOR) 80 MG tablet Take 1 tablet (80 mg total) by mouth daily. 30 tablet 11  . clopidogrel (PLAVIX) 75 MG tablet Take 1 tablet (75 mg total) by mouth daily. 30 tablet 11  . cyclobenzaprine (FLEXERIL) 10 MG tablet Take 10 mg by mouth 3 (three) times daily as needed for  muscle spasms.    . furosemide (LASIX) 40 MG tablet Take 40 mg by mouth every morning.     . hydrALAZINE (APRESOLINE) 50 MG tablet Take 100 mg by mouth 3 (three) times daily. Recently increased by Dr. Elmarie Shiley (kidney MD).    Marland Kitchen HYDROcodone-acetaminophen (NORCO) 7.5-325 MG per tablet Take 1 tablet by mouth every 4 (four) hours as needed for pain.    . metoprolol (LOPRESSOR) 50 MG tablet Take 100 mg by mouth 2 (two) times daily. Recently increased by Dr.  Luan Pulling).    . nitroGLYCERIN (NITROSTAT) 0.4 MG SL tablet Place 1 tablet (0.4 mg total) under the tongue every 5 (five) minutes as needed for chest pain. 25  tablet 3  . potassium chloride SA (K-DUR,KLOR-CON) 20 MEQ tablet Take 20 mEq by mouth 2 (two) times daily.      No current facility-administered medications for this visit.    Allergies:   Mobic and Tramadol    Social History:  The patient  reports that he has been smoking Cigarettes.  He started smoking about 30 years ago. He has a 6 pack-year smoking history. He has never used smokeless tobacco. He reports that he drinks alcohol. He reports that he does not use illicit drugs.   Family History:  The patient's family history includes Heart attack in his brother, father, and sister.    ROS: .   All other systems are reviewed and negative.Unless otherwise mentioned in  H&P above.   PHYSICAL EXAM: VS:  BP 128/82 mmHg  Pulse 89  Ht 6\' 3"  (1.905 m)  Wt 271 lb (122.925 kg)  BMI 33.87 kg/m2  SpO2 95% , BMI Body mass index is 33.87 kg/(m^2). GEN: Well nourished, well developed, in no acute distress HEENT: normal Neck: no JVD, carotid bruits, or masses Cardiac: RRR; no murmurs, rubs, or gallops,no edema  Respiratory:  Clear to auscultation bilaterally, normal work of breathing, some upper airway raspiness GI: soft, nontender, nondistended, + BS, obese MS: no deformity or atrophy Skin: warm and dry, no rash Neuro:  Strength and sensation are intact Psych: euthymic mood, full affect  Recent Labs: 03/18/2014: BUN 22; Creatinine 2.34*; Hemoglobin 14.0; Platelets 231; Potassium 4.0; Sodium 138    Lipid Panel    Component Value Date/Time   CHOL 172 01/14/2014 1135   TRIG 197* 01/14/2014 1135   HDL 24* 01/14/2014 1135   CHOLHDL 7.2 01/14/2014 1135   VLDL 39 01/14/2014 1135   LDLCALC 109* 01/14/2014 1135      Wt Readings from Last 3 Encounters:  02/20/15 271 lb (122.925 kg)  09/18/14 272 lb (123.378 kg)  05/29/14 280 lb (127.007 kg)     ASSESSMENT AND PLAN:  1.  Noncardiac chest pain: This patient be more musculoskeletal related to moving his left arm, exertion, and smoking.   The patient has had a drug-eluting stent placed in September of 2014 to the right coronary artery.  If discomfort continues despite Flexeril, which he is not taking regularly, will consider repeat stress test.  2. CAD: Prior non-ST elevated MI 08/25/2039 with drug-eluting stent to the right coronary artery, continue amlodipine, aspirin, statin, beta blocker,and Plavix.  I am going to continue the dual antiplatelet therapy for another 6 months to see stent having some discomfort.  May need to repeat his stress test if this continues.he may be having some bronchial spasms in the setting of nicotine and beta blocker therapy.  It appears to be more musculoskeletal.  Based upon his description.  3. Hypertension: He is on hydralazine 3 times a day is only taking it twice a day.he states his blood pressure does go up in the middle of the day and he has a headache.  I have advised him to take as directed.  4. Tobacco abuse: He continues to smoke heavily, 2-21/2 pkgs a day. I advised him on smoking cessation.  He says he is trying to cut down that is been very difficult for him.  I advised him that this will cause progression and CAD.  5. Hyperlipidemia: He continues on statin therapy, with labs drawn by her primary care physician.   Current medicines are reviewed at length with the patient today.    Labs/ tests ordered today include: None No orders of the defined types were placed in this encounter.     Disposition:   FU with 6 months   Signed, Jory Sims, NP  02/20/2015 2:04 PM    [facility] [address], [place], [place] [address] Phone: [phone]; Fax: [phone]

## 2015-02-20 NOTE — Addendum Note (Signed)
Addended by: Levonne Hubert on: 02/20/2015 03:07 PM   Modules accepted: Level of Service

## 2015-02-20 NOTE — Progress Notes (Deleted)
Name: Wayne Spencer    DOB: 06/22/1971  Age: 44 y.o.  MR#: [id number]       PCP:  Lanette Hampshire, MD      Insurance: Payor: / No coverage found.  CC:    Chief Complaint  Patient presents with  . Bradycardia    VS Filed Vitals:   02/20/15 1346  BP: 128/82  Pulse: 89  Height: 6\' 3"  (1.905 m)  Weight: 271 lb (122.925 kg)  SpO2: 95%    Weights Current Weight  02/20/15 271 lb (122.925 kg)  09/18/14 272 lb (123.378 kg)  05/29/14 280 lb (127.007 kg)    Blood Pressure  BP Readings from Last 3 Encounters:  02/20/15 128/82  09/18/14 110/70  05/29/14 124/64     Admit date:  (Not on file) Last encounter with RMR:  09/18/2014   Allergy Mobic and Tramadol  Current Outpatient Prescriptions  Medication Sig Dispense Refill  . ALPRAZolam (XANAX) 1 MG tablet Take 1 mg by mouth 3 (three) times daily as needed for anxiety.    Marland Kitchen amLODipine (NORVASC) 5 MG tablet Take 1 tablet (5 mg total) by mouth daily. 30 tablet 6  . aspirin EC 81 MG EC tablet Take 1 tablet (81 mg total) by mouth daily.    Marland Kitchen atorvastatin (LIPITOR) 80 MG tablet Take 1 tablet (80 mg total) by mouth daily. 30 tablet 11  . clopidogrel (PLAVIX) 75 MG tablet Take 1 tablet (75 mg total) by mouth daily. 30 tablet 11  . cyclobenzaprine (FLEXERIL) 10 MG tablet Take 10 mg by mouth 3 (three) times daily as needed for muscle spasms.    . furosemide (LASIX) 40 MG tablet Take 40 mg by mouth every morning.     . hydrALAZINE (APRESOLINE) 50 MG tablet Take 100 mg by mouth 3 (three) times daily. Recently increased by Dr. Elmarie Shiley (kidney MD).    Marland Kitchen HYDROcodone-acetaminophen (NORCO) 7.5-325 MG per tablet Take 1 tablet by mouth every 4 (four) hours as needed for pain.    . metoprolol (LOPRESSOR) 50 MG tablet Take 100 mg by mouth 2 (two) times daily. Recently increased by Dr.  Luan Pulling).    . nitroGLYCERIN (NITROSTAT) 0.4 MG SL tablet Place 1 tablet (0.4 mg total) under the tongue every 5 (five) minutes as needed for chest pain. 25 tablet 3   . potassium chloride SA (K-DUR,KLOR-CON) 20 MEQ tablet Take 20 mEq by mouth 2 (two) times daily.      No current facility-administered medications for this visit.    Discontinued Meds:   There are no discontinued medications.  Patient Active Problem List   Diagnosis Date Noted  . NSTEMI (non-ST elevated myocardial infarction) 09/05/2013  . CKD (chronic kidney disease) stage 4, GFR 15-29 ml/min 08/11/2013  . Hypokalemia 08/11/2013  . Tobacco abuse   . Hyperlipidemia   . Nephrotic syndrome   . Hypertension   . CAD (coronary artery disease)   . SCIATICA 10/30/2008  . BACK PAIN 10/30/2008    LABS    Component Value Date/Time   NA 138 03/18/2014 0118   NA 138 01/14/2014 1135   NA 135 08/14/2013 2058   K 4.0 03/18/2014 0118   K 4.3 01/14/2014 1135   K 3.1* 08/14/2013 2058   CL 103 03/18/2014 0118   CL 108 01/14/2014 1135   CL 102 08/14/2013 2058   CO2 22 03/18/2014 0118   CO2 23 01/14/2014 1135   CO2 23 08/14/2013 2058   GLUCOSE 99 03/18/2014 0118  GLUCOSE 140* 01/14/2014 1135   GLUCOSE 124* 08/14/2013 2058   BUN 22 03/18/2014 0118   BUN 15 01/14/2014 1135   BUN 15 08/14/2013 2058   CREATININE 2.34* 03/18/2014 0118   CREATININE 2.14* 01/14/2014 1135   CREATININE 2.36* 08/14/2013 2058   CREATININE 1.91* 08/12/2013 0426   CALCIUM 8.6 03/18/2014 0118   CALCIUM 8.5 01/14/2014 1135   CALCIUM 8.8 08/14/2013 2058   GFRNONAA 33* 03/18/2014 0118   GFRNONAA 37* 01/14/2014 1135   GFRNONAA 33* 08/14/2013 2058   GFRNONAA 42* 08/12/2013 0426   GFRAA 38* 03/18/2014 0118   GFRAA 43* 01/14/2014 1135   GFRAA 38* 08/14/2013 2058   GFRAA 49* 08/12/2013 0426   CMP     Component Value Date/Time   NA 138 03/18/2014 0118   K 4.0 03/18/2014 0118   CL 103 03/18/2014 0118   CO2 22 03/18/2014 0118   GLUCOSE 99 03/18/2014 0118   BUN 22 03/18/2014 0118   CREATININE 2.34* 03/18/2014 0118   CREATININE 2.14* 01/14/2014 1135   CALCIUM 8.6 03/18/2014 0118   PROT 5.1* 08/10/2013 0435    ALBUMIN 2.4* 08/12/2013 0426   AST 20 08/10/2013 0435   ALT 12 08/10/2013 0435   ALKPHOS 44 08/10/2013 0435   BILITOT 0.1* 08/10/2013 0435   GFRNONAA 33* 03/18/2014 0118   GFRNONAA 37* 01/14/2014 1135   GFRAA 38* 03/18/2014 0118   GFRAA 43* 01/14/2014 1135       Component Value Date/Time   WBC 11.0* 03/18/2014 0118   WBC 9.6 08/14/2013 2058   WBC 9.6 08/12/2013 0426   HGB 14.0 03/18/2014 0118   HGB 12.8* 08/14/2013 2058   HGB 12.2* 08/12/2013 0426   HCT 40.9 03/18/2014 0118   HCT 36.3* 08/14/2013 2058   HCT 34.0* 08/12/2013 0426   MCV 93.8 03/18/2014 0118   MCV 93.1 08/14/2013 2058   MCV 91.9 08/12/2013 0426    Lipid Panel     Component Value Date/Time   CHOL 172 01/14/2014 1135   TRIG 197* 01/14/2014 1135   HDL 24* 01/14/2014 1135   CHOLHDL 7.2 01/14/2014 1135   VLDL 39 01/14/2014 1135   LDLCALC 109* 01/14/2014 1135    ABG    Component Value Date/Time   TCO2 23 08/19/2009 2045     Lab Results  Component Value Date   TSH 4.001 08/09/2013   BNP (last 3 results) No results for input(s): BNP in the last 8760 hours.  ProBNP (last 3 results) No results for input(s): PROBNP in the last 8760 hours.  Cardiac Panel (last 3 results) No results for input(s): CKTOTAL, CKMB, TROPONINI, RELINDX in the last 72 hours.  Iron/TIBC/Ferritin/ %Sat No results found for: IRON, TIBC, FERRITIN, IRONPCTSAT   EKG Orders placed or performed in visit on 09/18/14  . EKG 12-Lead     Prior Assessment and Plan Problem List as of 02/20/2015      Cardiovascular and Mediastinum   Hypertension   Last Assessment & Plan 09/18/2014 Office Visit Written 09/18/2014  2:01 PM by Lendon Colonel, NP    Currently well controlled. No changes in medications. His medication regimen has been adjusted by Dr. Luan Pulling. Will not make any changes.      CAD (coronary artery disease)   Last Assessment & Plan 09/18/2014 Office Visit Written 09/18/2014  2:00 PM by Lendon Colonel, NP    He is  without complaints of significant chest pain. He has occasional discomfort, but not enough to take NTG. I have asked him  to refill his NTG tablet as he has had the same bottle for over 2 years. Risk factor modification is main goal. He is to continue statin, ASA, BB. Labs are being followed by Dr. Luan Pulling. Will see him again in 6 months.       NSTEMI (non-ST elevated myocardial infarction)     Nervous and Auditory   SCIATICA     Genitourinary   Nephrotic syndrome   CKD (chronic kidney disease) stage 4, GFR 15-29 ml/min     Other   BACK PAIN   Last Assessment & Plan 02/01/2014 Office Visit Written 02/01/2014  4:33 PM by Lendon Colonel, NP    Chronic for him. Defer to primary care physician for ongoing management and referral as medically necessary.      Tobacco abuse   Last Assessment & Plan 09/18/2014 Office Visit Written 09/18/2014  2:03 PM by Lendon Colonel, NP    I have asked him to quit smoking. He is cutting down. I have stressed the importance of cessation with known CAD and high probability of progressive CAD. He verbalizes understanding.      Hyperlipidemia   Hypokalemia       Imaging: No results found.

## 2015-02-20 NOTE — Patient Instructions (Signed)
Your physician wants you to follow-up in: 6 months with Curt Bears.  You will receive a reminder letter in the mail two months in advance. If you don't receive a letter, please call our office to schedule the follow-up appointment.  Your physician recommends that you continue on your current medications as directed. Please refer to the Current Medication list given to you today.  Thank you for choosing [facility]!

## 2015-04-11 IMAGING — CT CT L SPINE W/O CM
3 of 8 series · 12 of 33 positions shown, 14 images · non-contrast
Comparison: Lumbar spine radiographs 05/16/2014. Abdominal pelvic
CT 05/03/2006.

CLINICAL DATA: Ongoing low back pain. History of chronic headaches.

EXAM:
CT LUMBAR SPINE WITHOUT CONTRAST
TECHNIQUE: Multidetector CT imaging of the lumbar spine was performed without
intravenous contrast administration. Multiplanar CT image
reconstructions were also generated.

[Series 3: lumbar spine 2.0 b30s · axial · 0.36mm/px · z∈[-306,-138]mm · 4 of 114 slices shown, 5 images]
[im 15/114  soft-tissue]
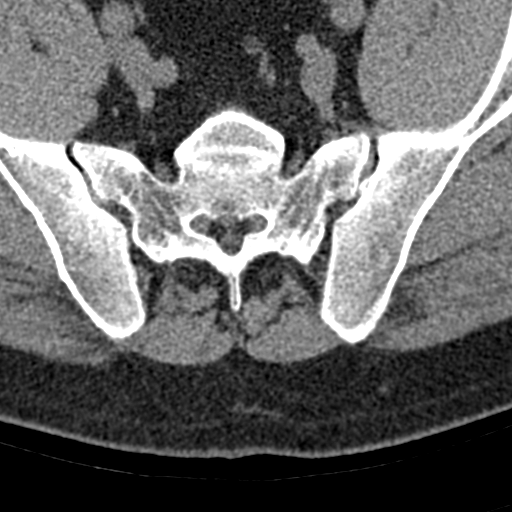
[im 15/114  bone]
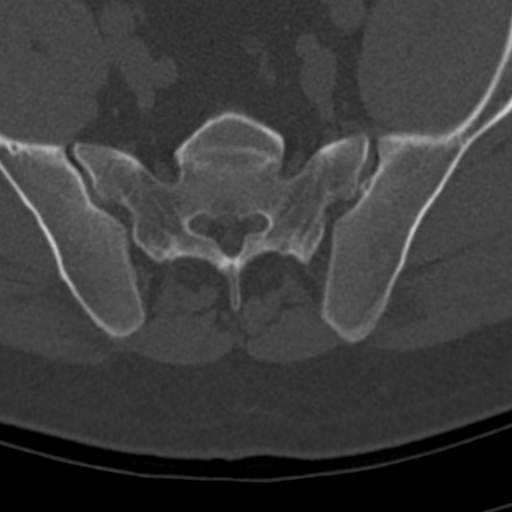
[im 43/114  bone]
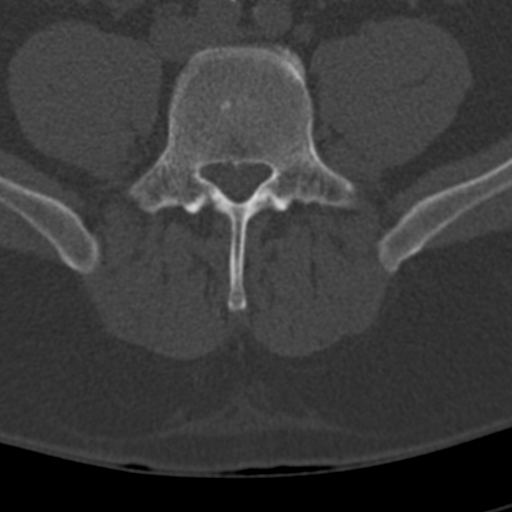
[im 71/114  bone]
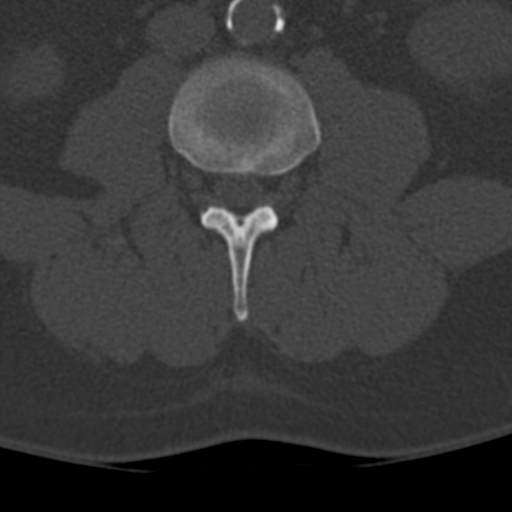
[im 99/114  bone]
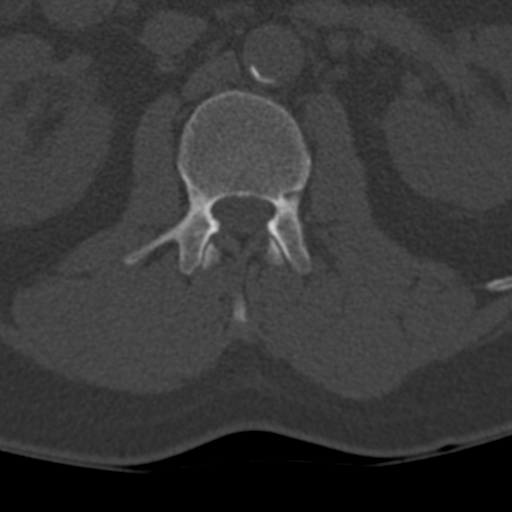

[Series 4: lumbar spine 2.0 spo cor · coronal · 0.33mm/px · 3 of 89 slices shown]
[im 18/89  bone]
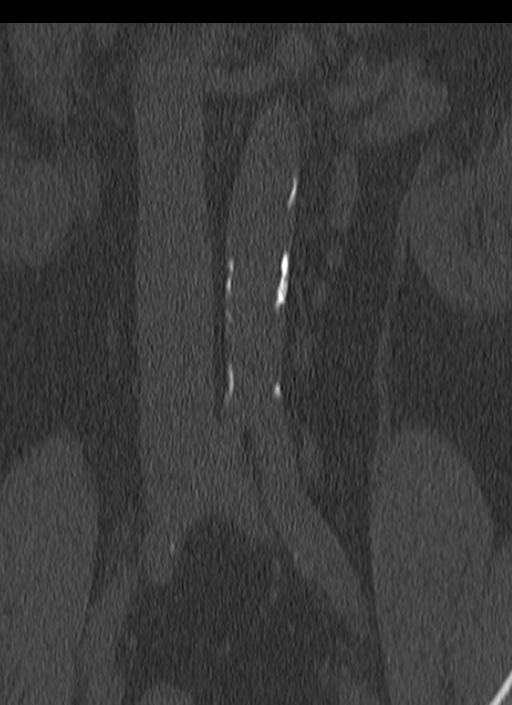
[im 36/89  bone]
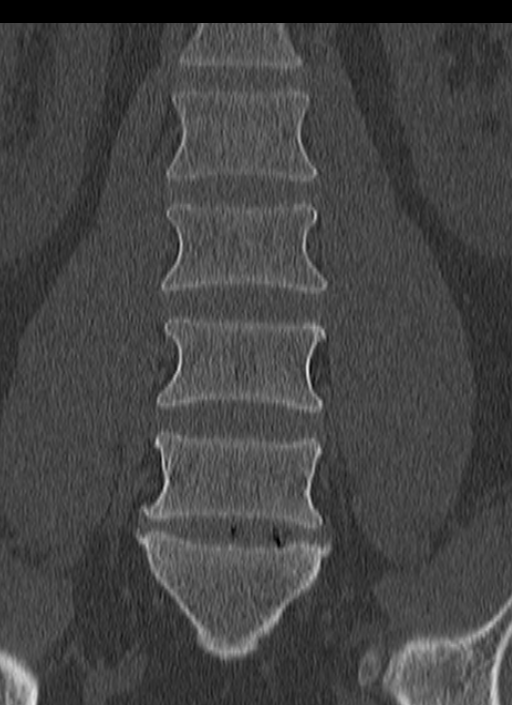
[im 53/89  bone]
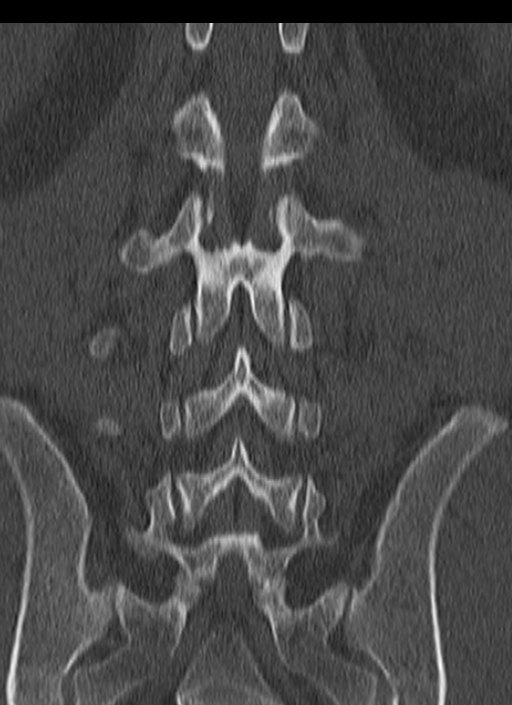

[Series 5: lumbar spine 2.0 spo sag · sagittal · 0.34mm/px · 5 of 105 slices shown, 6 images]
[im 35/105  bone]
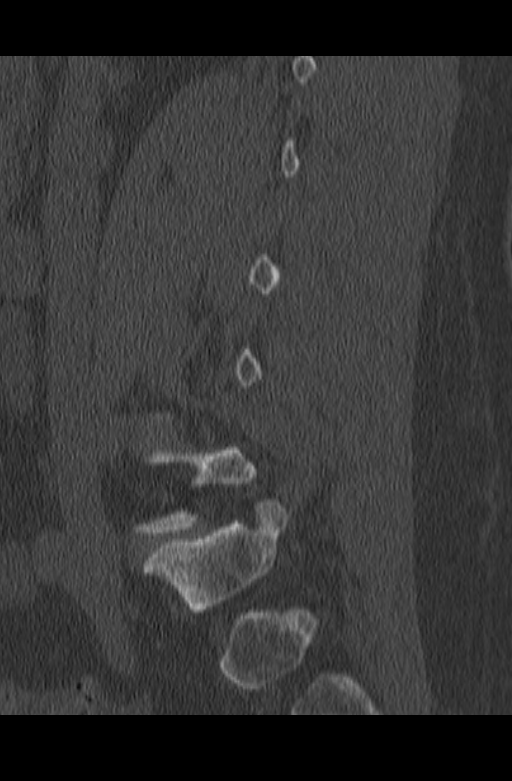
[im 44/105  bone]
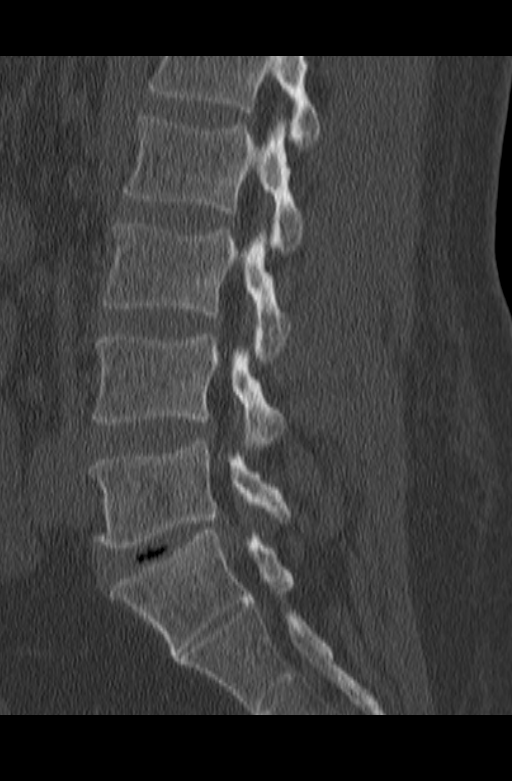
[im 53/105  soft-tissue]
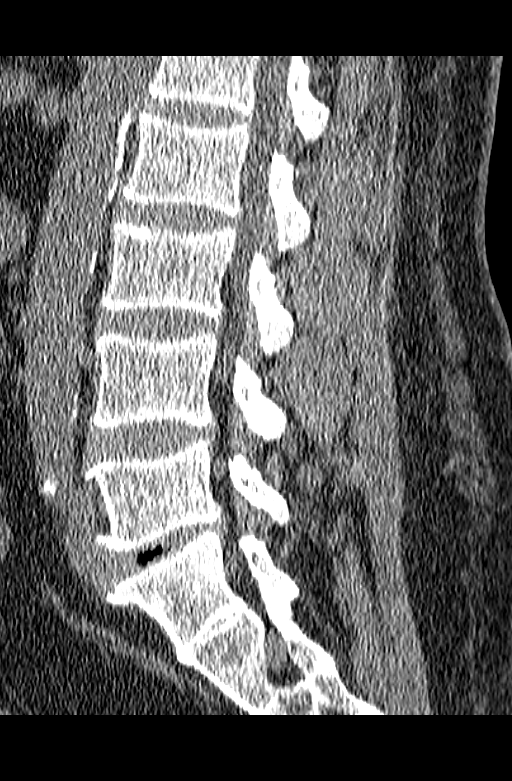
[im 53/105  bone]
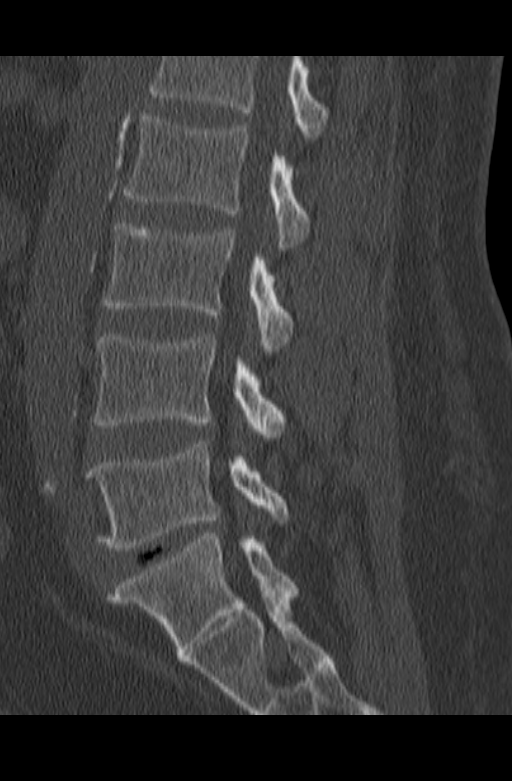
[im 61/105  bone]
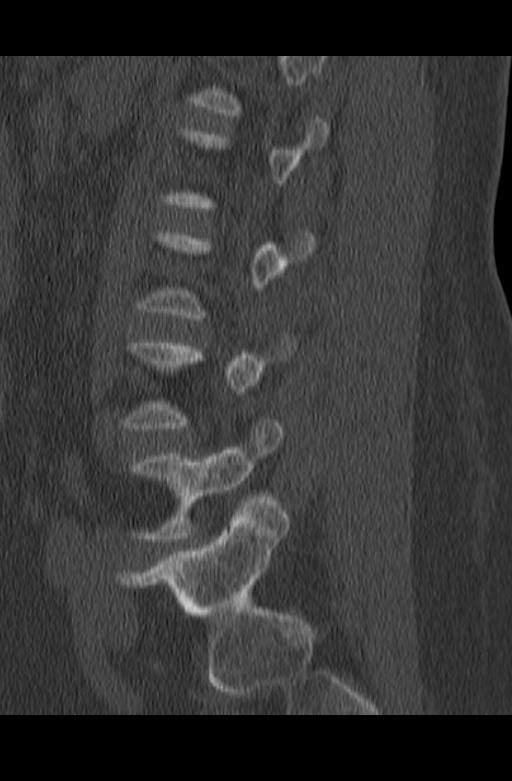
[im 70/105  bone]
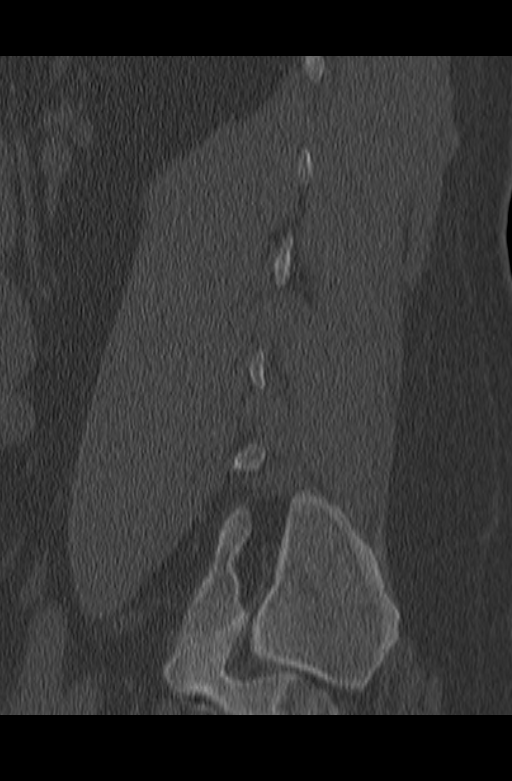

[12 of 33 positions shown; findings below may reference images not displayed]

FINDINGS: There is transitional lumbosacral anatomy. Based on prior studies,
there is a transitional partially lumbarized S1 segment. The lumbar
pedicles are short on a congenital basis. There is disc space loss
with 3 mm of retrolisthesis at L5-S1. There is associated vacuum
phenomenon. The additional disc spaces are maintained. There is mild
Schmorl's node formation in the superior endplate of L3.

There is no evidence of acute fracture or pars defect. No paraspinal
abnormalities are seen. Aortoiliac atherosclerosis is noted.

L1-2:  Normal interspace.

L2-3: Mild disc bulging with Schmorl's node formation. No spinal
stenosis or nerve root encroachment.

L3-4:  Normal interspace.

L4-5:  Normal interspace.

L5-S1: There is degenerative disc disease with annular disc bulging
and vacuum phenomenon. There is mild narrowing of the right lateral
recess. The foramina appear sufficiently patent.
IMPRESSION: 1. Transitional lumbosacral anatomy with partially lumbarized S1
segment.
2. Degenerative disc disease at L5-S1 with annular disc bulging,
vacuum phenomenon and mild narrowing of the right lateral recess.
3. No focal disc herniation, high-grade spinal stenosis or definite
nerve root encroachment.
4. Prominent atherosclerosis for age.

## 2015-06-18 ENCOUNTER — Emergency Department (HOSPITAL_COMMUNITY)
Admission: EM | Admit: 2015-06-18 | Discharge: 2015-06-18 | Disposition: A | Discharge status: Home / Self Care | Payer: Self-pay | Attending: Emergency Medicine | Admitting: Emergency Medicine

## 2015-06-18 ENCOUNTER — Encounter (HOSPITAL_COMMUNITY): Payer: Self-pay | Admitting: *Deleted

## 2015-06-18 ENCOUNTER — Emergency Department (HOSPITAL_COMMUNITY): Payer: Self-pay

## 2015-06-18 DIAGNOSIS — L03115 Cellulitis of right lower limb: Secondary | ICD-10-CM | POA: Insufficient documentation

## 2015-06-18 DIAGNOSIS — Z7902 Long term (current) use of antithrombotics/antiplatelets: Secondary | ICD-10-CM | POA: Insufficient documentation

## 2015-06-18 DIAGNOSIS — R5383 Other fatigue: Secondary | ICD-10-CM | POA: Insufficient documentation

## 2015-06-18 DIAGNOSIS — I2510 Atherosclerotic heart disease of native coronary artery without angina pectoris: Secondary | ICD-10-CM | POA: Insufficient documentation

## 2015-06-18 DIAGNOSIS — Z720 Tobacco use: Secondary | ICD-10-CM | POA: Insufficient documentation

## 2015-06-18 DIAGNOSIS — I889 Nonspecific lymphadenitis, unspecified: Secondary | ICD-10-CM

## 2015-06-18 DIAGNOSIS — E785 Hyperlipidemia, unspecified: Secondary | ICD-10-CM | POA: Insufficient documentation

## 2015-06-18 DIAGNOSIS — L049 Acute lymphadenitis, unspecified: Secondary | ICD-10-CM | POA: Insufficient documentation

## 2015-06-18 DIAGNOSIS — I10 Essential (primary) hypertension: Secondary | ICD-10-CM | POA: Insufficient documentation

## 2015-06-18 DIAGNOSIS — R509 Fever, unspecified: Secondary | ICD-10-CM | POA: Insufficient documentation

## 2015-06-18 DIAGNOSIS — Z79899 Other long term (current) drug therapy: Secondary | ICD-10-CM | POA: Insufficient documentation

## 2015-06-18 DIAGNOSIS — Z7982 Long term (current) use of aspirin: Secondary | ICD-10-CM | POA: Insufficient documentation

## 2015-06-18 DIAGNOSIS — Z9861 Coronary angioplasty status: Secondary | ICD-10-CM | POA: Insufficient documentation

## 2015-06-18 LAB — URINALYSIS, ROUTINE W REFLEX MICROSCOPIC
Bilirubin Urine: NEGATIVE
Glucose, UA: NEGATIVE mg/dL
Ketones, ur: NEGATIVE mg/dL
Leukocytes, UA: NEGATIVE
Nitrite: NEGATIVE
Protein, ur: >300 mg/dL — AB
Specific Gravity, Urine: 1.02 (ref 1.005–1.030)
Urobilinogen, UA: 0.2 mg/dL (ref 0.0–1.0)
pH: 6 (ref 5.0–8.0)

## 2015-06-18 LAB — CBC WITH DIFFERENTIAL/PLATELET
Basophils Absolute: 0 10*3/uL (ref 0.0–0.1)
Basophils Relative: 0 % (ref 0–1)
Eosinophils Absolute: 0 10*3/uL (ref 0.0–0.7)
Eosinophils Relative: 0 % (ref 0–5)
HCT: 43.2 % (ref 39.0–52.0)
Hemoglobin: 15.2 g/dL (ref 13.0–17.0)
Lymphocytes Relative: 3 % — ABNORMAL LOW (ref 12–46)
Lymphs Abs: 0.7 10*3/uL (ref 0.7–4.0)
MCH: 33.3 pg (ref 26.0–34.0)
MCHC: 35.2 g/dL (ref 30.0–36.0)
MCV: 94.7 fL (ref 78.0–100.0)
Monocytes Absolute: 1.1 10*3/uL — ABNORMAL HIGH (ref 0.1–1.0)
Monocytes Relative: 5 % (ref 3–12)
Neutro Abs: 18.8 10*3/uL — ABNORMAL HIGH (ref 1.7–7.7)
Neutrophils Relative %: 92 % — ABNORMAL HIGH (ref 43–77)
Platelets: 140 10*3/uL — ABNORMAL LOW (ref 150–400)
RBC: 4.56 MIL/uL (ref 4.22–5.81)
RDW: 13.1 % (ref 11.5–15.5)
WBC: 20.6 10*3/uL — ABNORMAL HIGH (ref 4.0–10.5)

## 2015-06-18 LAB — BASIC METABOLIC PANEL
Anion gap: 10 (ref 5–15)
BUN: 12 mg/dL (ref 6–20)
CO2: 23 mmol/L (ref 22–32)
Calcium: 8.3 mg/dL — ABNORMAL LOW (ref 8.9–10.3)
Chloride: 103 mmol/L (ref 101–111)
Creatinine, Ser: 1.89 mg/dL — ABNORMAL HIGH (ref 0.61–1.24)
GFR calc Af Amer: 49 mL/min — ABNORMAL LOW (ref >60)
GFR calc non Af Amer: 42 mL/min — ABNORMAL LOW (ref >60)
Glucose, Bld: 118 mg/dL — ABNORMAL HIGH (ref 65–99)
Potassium: 3.5 mmol/L (ref 3.5–5.1)
Sodium: 136 mmol/L (ref 135–145)

## 2015-06-18 LAB — URINE MICROSCOPIC-ADD ON

## 2015-06-18 LAB — I-STAT CG4 LACTIC ACID, ED: Lactic Acid, Venous: 1.63 mmol/L (ref 0.5–2.0)

## 2015-06-18 MED ORDER — SODIUM CHLORIDE 0.9 % IV BOLUS (SEPSIS)
500.0000 mL | Freq: Once | INTRAVENOUS | Status: AC
Start: 2015-06-18 — End: 2015-06-18
  Administered 2015-06-18: 500 mL via INTRAVENOUS

## 2015-06-18 MED ORDER — ACETAMINOPHEN 325 MG PO TABS
650.0000 mg | ORAL_TABLET | Freq: Once | ORAL | Status: AC
  Administered 2015-06-18: 650 mg via ORAL

## 2015-06-18 MED ORDER — SODIUM CHLORIDE 0.9 % IJ SOLN
INTRAMUSCULAR | Status: AC
  Filled 2015-06-18: qty 60

## 2015-06-18 MED ORDER — SODIUM CHLORIDE 0.9 % IV SOLN
Freq: Once | INTRAVENOUS | Status: AC
  Administered 2015-06-18: 16:00:00 via INTRAVENOUS

## 2015-06-18 MED ORDER — ONDANSETRON HCL 4 MG/2ML IJ SOLN
4.0000 mg | Freq: Once | INTRAMUSCULAR | Status: AC
  Administered 2015-06-18: 4 mg via INTRAVENOUS
  Filled 2015-06-18: qty 2

## 2015-06-18 MED ORDER — IOHEXOL 300 MG/ML  SOLN
75.0000 mL | Freq: Once | INTRAMUSCULAR | Status: AC | PRN
  Administered 2015-06-18: 75 mL via INTRAVENOUS

## 2015-06-18 MED ORDER — ACETAMINOPHEN 500 MG PO TABS
1000.0000 mg | ORAL_TABLET | Freq: Once | ORAL | Status: AC
  Administered 2015-06-18: 1000 mg via ORAL
  Filled 2015-06-18: qty 2

## 2015-06-18 MED ORDER — ACETAMINOPHEN 325 MG PO TABS
ORAL_TABLET | ORAL | Status: AC
  Filled 2015-06-18: qty 2

## 2015-06-18 MED ORDER — SODIUM CHLORIDE 0.9 % IJ SOLN
INTRAMUSCULAR | Status: AC
  Filled 2015-06-18: qty 1000

## 2015-06-18 MED ORDER — AMOXICILLIN-POT CLAVULANATE 875-125 MG PO TABS: 1.0000 | ORAL_TABLET | Freq: Two times a day (BID) | ORAL | Status: DC

## 2015-06-18 MED ORDER — ACETAMINOPHEN 325 MG PO TABS: 650.0000 mg | ORAL_TABLET | Freq: Once | ORAL | Status: DC

## 2015-06-18 MED ORDER — CEFAZOLIN SODIUM 1 G IJ SOLR
2.0000 g | Freq: Once | INTRAMUSCULAR | Status: AC
  Administered 2015-06-18: 2 g via INTRAMUSCULAR
  Filled 2015-06-18: qty 20

## 2015-06-18 MED ORDER — STERILE WATER FOR INJECTION IJ SOLN
INTRAMUSCULAR | Status: AC
  Administered 2015-06-18: 10 mL
  Filled 2015-06-18: qty 10

## 2015-06-18 MED ORDER — MORPHINE SULFATE 4 MG/ML IJ SOLN: 4.0000 mg | INTRAMUSCULAR | Status: DC | PRN

## 2015-06-18 NOTE — ED Notes (Signed)
Patient sleeping with no indication of pain.

## 2015-06-18 NOTE — ED Provider Notes (Signed)
CSN: [id number]     Arrival date & time 06/18/15  1343 History   First MD Initiated Contact with Patient 06/18/15 1526     Chief Complaint  Patient presents with  . Generalized Body Aches      HPI  Emergency evaluation of fever, and pain in his right groin. States he was in her normal state of health yesterday. He awakened at 3:00 this morning with pain in his right groin. He states he can feel a "lump" in his groin. He states this was not there yesterday and and has never felt the pain in her lump before. He is having shakes and chills all morning. States he was sleeping with extra blanket. Apparently his wife. Temperature home and found it at 101 and he presents here.  Please of mild diffuse myalgias. Mild headache. No neck pain or stiffness. No cough difficulty breathing. No nausea vomiting or abdominal pain. He has an area on his left knee where he states he was "pricked by a thorn". In an area on his right foot where he was stung by a bee several days ago. Neither have bothered him.  Past Medical History  Diagnosis Date  . Cellulitis   . Hypertension   . Hyperlipidemia   . Tobacco abuse   . Chronic headache   . CAD (coronary artery disease)   . Nephrotic syndrome    Past Surgical History  Procedure Laterality Date  . Coronary stent placement    . Left heart catheterization with coronary angiogram N/A 08/10/2013    Procedure: LEFT HEART CATHETERIZATION WITH CORONARY ANGIOGRAM;  Surgeon: [name], MD;  Location: [facility] CATH LAB;  Service: Cardiovascular;  Laterality: N/A;  . Percutaneous coronary stent intervention (pci-s) N/A 08/11/2013    Procedure: PERCUTANEOUS CORONARY STENT INTERVENTION (PCI-S);  Surgeon: Blane Ohara, MD;  Location: [facility] CATH LAB;  Service: Cardiovascular;  Laterality: N/A;   Family History  Problem Relation Age of Onset  . Heart attack Father     48s  . Heart attack Brother     29s, s/p CABG, passed from MI at 41  . Heart attack Sister     67s    History  Substance Use Topics  . Smoking status: Current Every Day Smoker -- 1.00 packs/day for 6 years    Types: Cigarettes    Start date: 02/19/1985  . Smokeless tobacco: Never Used  . Alcohol Use: 0.0 oz/week    0 Standard drinks or equivalent per week     Comment: occasional    Review of Systems  Constitutional: Positive for fever, chills and fatigue. Negative for diaphoresis and appetite change.  HENT: Negative for mouth sores, sore throat and trouble swallowing.   Eyes: Negative for visual disturbance.  Respiratory: Negative for cough, chest tightness, shortness of breath and wheezing.   Cardiovascular: Negative for chest pain.  Gastrointestinal: Negative for nausea, vomiting, abdominal pain, diarrhea and abdominal distention.  Endocrine: Negative for polydipsia, polyphagia and polyuria.  Genitourinary: Negative for dysuria, frequency and hematuria.       Pain, tenderness, and a palpable lump in his right groin just below the inguinal crease.  Musculoskeletal: Negative for gait problem.  Skin: Negative for color change, pallor and rash.  Neurological: Negative for dizziness, syncope, light-headedness and headaches.  Hematological: Does not bruise/bleed easily.  Psychiatric/Behavioral: Negative for behavioral problems and confusion.      Allergies  Mobic and Tramadol  Home Medications   Prior to Admission medications   Medication Sig  Start Date End Date Taking? Authorizing Provider  ALPRAZolam Duanne Moron) 1 MG tablet Take 1 mg by mouth 3 (three) times daily as needed for anxiety.   Yes Historical Provider, MD  amLODipine (NORVASC) 5 MG tablet Take 1 tablet (5 mg total) by mouth daily. 01/09/14  Yes Arnoldo Lenis, MD  aspirin EC 81 MG EC tablet Take 1 tablet (81 mg total) by mouth daily. 08/12/13  Yes Rhonda G Barrett, PA-C  atorvastatin (LIPITOR) 80 MG tablet Take 1 tablet (80 mg total) by mouth daily. 08/12/13  Yes Rhonda G Barrett, PA-C  clopidogrel (PLAVIX) 75 MG  tablet Take 1 tablet (75 mg total) by mouth daily. 08/12/13  Yes Rhonda G Barrett, PA-C  cyclobenzaprine (FLEXERIL) 10 MG tablet Take 10 mg by mouth 3 (three) times daily as needed for muscle spasms.   Yes Historical Provider, MD  furosemide (LASIX) 40 MG tablet Take 40 mg by mouth every morning.    Yes Historical Provider, MD  hydrALAZINE (APRESOLINE) 50 MG tablet Take 100 mg by mouth 3 (three) times daily. Recently increased by Dr. Elmarie Shiley (kidney MD).   Yes Historical Provider, MD  HYDROcodone-acetaminophen (NORCO) 7.5-325 MG per tablet Take 1 tablet by mouth every 4 (four) hours as needed for pain.   Yes Historical Provider, MD  metoprolol (LOPRESSOR) 50 MG tablet Take 100 mg by mouth 2 (two) times daily. Recently increased by Dr.  Luan Pulling).   Yes Historical Provider, MD  nitroGLYCERIN (NITROSTAT) 0.4 MG SL tablet Place 1 tablet (0.4 mg total) under the tongue every 5 (five) minutes as needed for chest pain. 09/18/14  Yes Lendon Colonel, NP  potassium chloride SA (K-DUR,KLOR-CON) 20 MEQ tablet Take 20 mEq by mouth 2 (two) times daily.    Yes Historical Provider, MD  amoxicillin-clavulanate (AUGMENTIN) 875-125 MG per tablet Take 1 tablet by mouth 2 (two) times daily. 06/18/15   Tanna Furry, MD   BP 121/82 mmHg  Pulse 107  Temp(Src) 101.8 F (38.8 C) (Oral)  Resp 16  Ht 6\' 3"  (1.905 m)  Wt 270 lb (122.471 kg)  BMI 33.75 kg/m2  SpO2 99% Physical Exam  Constitutional: He is oriented to person, place, and time. He appears well-developed and well-nourished. No distress.  HENT:  Head: Normocephalic.  Eyes: Conjunctivae are normal. Pupils are equal, round, and reactive to light. No scleral icterus.  Neck: Normal range of motion. Neck supple. No thyromegaly present.  Cardiovascular: Normal rate and regular rhythm.  Exam reveals no gallop and no friction rub.   No murmur heard. Pulmonary/Chest: Effort normal and breath sounds normal. No respiratory distress. He has no wheezes. He has no  rales.  Abdominal: Soft. Bowel sounds are normal. He exhibits no distension. There is no tenderness. There is no rebound.  Genitourinary:     To have 3 cm area of induration right groin. Had ultrasound shows 2-3 similar area of hypoechoic mass. No obvious fluid collection. Immediately adjacent femoral pulsation.  Musculoskeletal: Normal range of motion.  Neurological: He is alert and oriented to person, place, and time.  Skin: Skin is warm. No rash noted. He is diaphoretic.     Psychiatric: He has a normal mood and affect. His behavior is normal.    ED Course  Procedures (including critical care time) Labs Review Labs Reviewed  CBC WITH DIFFERENTIAL/PLATELET - Abnormal; Notable for the following:    WBC 20.6 (*)    Platelets 140 (*)    Neutrophils Relative % 92 (*)  Neutro Abs 18.8 (*)    Lymphocytes Relative 3 (*)    Monocytes Absolute 1.1 (*)    All other components within normal limits  BASIC METABOLIC PANEL - Abnormal; Notable for the following:    Glucose, Bld 118 (*)    Creatinine, Ser 1.89 (*)    Calcium 8.3 (*)    GFR calc non Af Amer 42 (*)    GFR calc Af Amer 49 (*)    All other components within normal limits  URINE CULTURE  URINALYSIS, ROUTINE W REFLEX MICROSCOPIC (NOT AT [facility])  I-STAT CG4 LACTIC ACID, ED    Imaging Review Ct Pelvis W Contrast  06/18/2015   CLINICAL DATA:  RIGHT groin mass, fever and pain starting today  EXAM: CT PELVIS WITH CONTRAST  TECHNIQUE: Multidetector CT imaging of the pelvis was performed using the standard protocol following the bolus administration of intravenous contrast. Sagittal and coronal MPR images reconstructed from axial data set.  CONTRAST:  75mL OMNIPAQUE IOHEXOL 300 MG/ML SOLN IV. Oral contrast was not administered.  COMPARISON:  05/03/2006  FINDINGS: Few scattered atherosclerotic calcifications.  Normal appendix.  Stomach and bowel loops grossly unremarkable for technique.  Normal appearing bladder and distal ureters.   Question tiny LEFT inguinal hernia containing fat.  RIGHT inguinal adenopathy identified, largest node 16 mm short axis image 53.  Largest node demonstrates mild surrounding infiltrative changes suggesting inflammation.  Few upper normal and normal sized BILATERAL external iliac and LEFT inguinal nodes are also identified.  No definite enlarged intrapelvic lymph nodes seen.  No additional mass or abnormal fluid collections to suggest abscess.  Osseous structures unremarkable.  IMPRESSION: RIGHT inguinal adenopathy, largest node showing mild surrounding infiltrative changes raising question of inflammation/adenitis.  Question tiny LEFT inguinal hernia.   Electronically Signed   By: Lavonia Dana M.D.   On: 06/18/2015 17:26     EKG Interpretation None      MDM   Final diagnoses:  Cellulitis of right lower extremity  Lymphadenitis    She given 2 g Ancef. He has a strong desire be treated at home. Think this is not an unreasonable request. Given Tylenol. Temperature improved. Plan is home, Augmentin, pain medicine which she hasn't home, Tylenol for fever. ER with any worsening symptoms. Routine primary care follow-up to ensure the mass resolves.    Tanna Furry, MD 06/18/15 Tresa Moore

## 2015-06-18 NOTE — Discharge Instructions (Signed)
Cellulitis Cellulitis is an infection of the skin and the tissue under the skin. The infected area is usually red and tender. This happens most often in the arms and lower legs. HOME CARE   Take your antibiotic medicine as told. Finish the medicine even if you start to feel better.  Keep the infected arm or leg raised (elevated).  Put a warm cloth on the area up to 4 times per day.  Only take medicines as told by your doctor.  Keep all doctor visits as told. GET HELP IF:  You see red streaks on the skin coming from the infected area.  Your red area gets bigger or turns a dark color.  Your bone or joint under the infected area is painful after the skin heals.  Your infection comes back in the same area or different area.  You have a puffy (swollen) bump in the infected area.  You have new symptoms.  You have a fever. GET HELP RIGHT AWAY IF:   You feel very sleepy.  You throw up (vomit) or have watery poop (diarrhea).  You feel sick and have muscle aches and pains. MAKE SURE YOU:   Understand these instructions.  Will watch your condition.  Will get help right away if you are not doing well or get worse. Document Released: 05/05/2008 Document Revised: 04/03/2014 Document Reviewed: 02/02/2012 [facility] Patient Information 2015 [place], [place]. This information is not intended to replace advice given to you by your health care provider. Make sure you discuss any questions you have with your health care provider.  Lymphadenopathy Lymphadenopathy means "disease of the lymph glands." But the term is usually used to describe swollen or enlarged lymph glands, also called lymph nodes. These are the bean-shaped organs found in many locations including the neck, underarm, and groin. Lymph glands are part of the immune system, which fights infections in your body. Lymphadenopathy can occur in just one area of the body, such as the neck, or it can be generalized, with lymph node  enlargement in several areas. The nodes found in the neck are the most common sites of lymphadenopathy. CAUSES When your immune system responds to germs (such as viruses or bacteria ), infection-fighting cells and fluid build up. This causes the glands to grow in size. Usually, this is not something to worry about. Sometimes, the glands themselves can become infected and inflamed. This is called lymphadenitis. Enlarged lymph nodes can be caused by many diseases:  Bacterial disease, such as strep throat or a skin infection.  Viral disease, such as a common cold.  Other germs, such as Lyme disease, tuberculosis, or sexually transmitted diseases.  Cancers, such as lymphoma (cancer of the lymphatic system) or leukemia (cancer of the white blood cells).  Inflammatory diseases such as lupus or rheumatoid arthritis.  Reactions to medications. Many of the diseases above are rare, but important. This is why you should see your caregiver if you have lymphadenopathy. SYMPTOMS  Swollen, enlarged lumps in the neck, back of the head, or other locations.  Tenderness.  Warmth or redness of the skin over the lymph nodes.  Fever. DIAGNOSIS Enlarged lymph nodes are often near the source of infection. They can help health care providers diagnose your illness. For instance:  Swollen lymph nodes around the jaw might be caused by an infection in the mouth.  Enlarged glands in the neck often signal a throat infection.  Lymph nodes that are swollen in more than one area often indicate an illness caused  by a virus. Your caregiver will likely know what is causing your lymphadenopathy after listening to your history and examining you. Blood tests, x-rays, or other tests may be needed. If the cause of the enlarged lymph node cannot be found, and it does not go away by itself, then a biopsy may be needed. Your caregiver will discuss this with you. TREATMENT Treatment for your enlarged lymph nodes will depend  on the cause. Many times the nodes will shrink to normal size by themselves, with no treatment. Antibiotics or other medicines may be needed for infection. Only take over-the-counter or prescription medicines for pain, discomfort, or fever as directed by your caregiver. HOME CARE INSTRUCTIONS Swollen lymph glands usually return to normal when the underlying medical condition goes away. If they persist, contact your health-care provider. He/she might prescribe antibiotics or other treatments, depending on the diagnosis. Take any medications exactly as prescribed. Keep any follow-up appointments made to check on the condition of your enlarged nodes. SEEK MEDICAL CARE IF:  Swelling lasts for more than two weeks.  You have symptoms such as weight loss, night sweats, fatigue, or fever that does not go away.  The lymph nodes are hard, seem fixed to the skin, or are growing rapidly.  Skin over the lymph nodes is red and inflamed. This could mean there is an infection. SEEK IMMEDIATE MEDICAL CARE IF:  Fluid starts leaking from the area of the enlarged lymph node.  You develop a fever of 102 F (38.9 C) or greater.  Severe pain develops (not necessarily at the site of a large lymph node).  You develop chest pain or shortness of breath.  You develop worsening abdominal pain. MAKE SURE YOU:  Understand these instructions.  Will watch your condition.  Will get help right away if you are not doing well or get worse. Document Released: 08/26/2008 Document Revised: 04/03/2014 Document Reviewed: 08/26/2008 [facility] Patient Information 2015 [place], [place]. This information is not intended to replace advice given to you by your health care provider. Make sure you discuss any questions you have with your health care provider.

## 2015-06-18 NOTE — ED Notes (Signed)
Patient reports fever and body aches that started this morning. Denies n/v/d. Reports feeling weak. Fever in triage 102.7.

## 2015-06-18 NOTE — ED Notes (Signed)
Patient states he does not need any stronger pain medication at this time.

## 2015-06-18 NOTE — ED Notes (Signed)
Patient with no complaints at this time. Respirations even and unlabored. Skin warm/dry. Discharge instructions reviewed with patient at this time. Patient given opportunity to voice concerns/ask questions. IV removed per policy and band-aid applied to site. Patient discharged at this time and left Emergency Department with steady gait.  

## 2015-06-20 LAB — URINE CULTURE: Culture: NO GROWTH

## 2015-08-03 ENCOUNTER — Ambulatory Visit: Payer: Self-pay | Admitting: Adult Health

## 2015-08-10 ENCOUNTER — Encounter: Payer: Self-pay | Admitting: Adult Health

## 2015-08-10 NOTE — Progress Notes (Signed)
Cardiology Office Note   Date:  08/10/2015   ID:  Wayne Spencer, DOB Sep 11, 1971, MRN [id number]  PCP:  Alonza Bogus, MD  Cardiologist:  Cloria Spring, NP   No chief complaint on file.   NO SHOW

## 2015-08-20 ENCOUNTER — Encounter: Payer: Self-pay | Admitting: Adult Health

## 2015-08-20 ENCOUNTER — Ambulatory Visit (INDEPENDENT_AMBULATORY_CARE_PROVIDER_SITE_OTHER): Payer: Self-pay | Admitting: Adult Health

## 2015-08-20 VITALS — BP 122/66 | HR 89 | Ht 75.0 in | Wt 276.0 lb

## 2015-08-20 DIAGNOSIS — E7800 Pure hypercholesterolemia, unspecified: Secondary | ICD-10-CM

## 2015-08-20 DIAGNOSIS — I2510 Atherosclerotic heart disease of native coronary artery without angina pectoris: Secondary | ICD-10-CM

## 2015-08-20 DIAGNOSIS — I10 Essential (primary) hypertension: Secondary | ICD-10-CM

## 2015-08-20 DIAGNOSIS — Z720 Tobacco use: Secondary | ICD-10-CM

## 2015-08-20 NOTE — Patient Instructions (Signed)
Your physician wants you to follow-up in: [place]. You will receive a reminder letter in the mail two months in advance. If you don't receive a letter, please call our office to schedule the follow-up appointment  Your physician recommends that you continue on your current medications as directed. Please refer to the Current Medication list given to you today.   Thanks for choosing [facility]!!!

## 2015-08-20 NOTE — Progress Notes (Signed)
Name: Wayne Spencer    DOB: 05-03-1971  Age: 44 y.o.  MR#: [id number]       PCP:  Alonza Bogus, MD      Insurance: Payor: / No coverage found.  CC:   No chief complaint on file.   VS Filed Vitals:   08/20/15 1545  BP: 122/66  Pulse: 89  Height: 6\' 3"  (1.905 m)  Weight: 276 lb (125.193 kg)  SpO2: 97%    Weights Current Weight  08/20/15 276 lb (125.193 kg)  06/18/15 270 lb (122.471 kg)  02/20/15 271 lb (122.925 kg)    Blood Pressure  BP Readings from Last 3 Encounters:  08/20/15 122/66  06/18/15 121/82  02/20/15 128/82     Admit date:  (Not on file) Last encounter with RMR:  Visit date not found   Allergy Mobic and Tramadol  Current Outpatient Prescriptions  Medication Sig Dispense Refill  . ALPRAZolam (XANAX) 1 MG tablet Take 1 mg by mouth 3 (three) times daily as needed for anxiety.    Marland Kitchen amLODipine (NORVASC) 5 MG tablet Take 1 tablet (5 mg total) by mouth daily. 30 tablet 6  . aspirin EC 81 MG EC tablet Take 1 tablet (81 mg total) by mouth daily.    Marland Kitchen atorvastatin (LIPITOR) 80 MG tablet Take 1 tablet (80 mg total) by mouth daily. 30 tablet 11  . clopidogrel (PLAVIX) 75 MG tablet Take 1 tablet (75 mg total) by mouth daily. 30 tablet 11  . cyclobenzaprine (FLEXERIL) 10 MG tablet Take 10 mg by mouth 3 (three) times daily as needed for muscle spasms.    . furosemide (LASIX) 40 MG tablet Take 40 mg by mouth every morning.     . hydrALAZINE (APRESOLINE) 50 MG tablet Take 100 mg by mouth 3 (three) times daily. Recently increased by Dr. Elmarie Shiley (kidney MD).    Marland Kitchen HYDROcodone-acetaminophen (NORCO) 7.5-325 MG per tablet Take 1 tablet by mouth every 4 (four) hours as needed for pain.    . metoprolol (LOPRESSOR) 50 MG tablet Take 100 mg by mouth 2 (two) times daily. Recently increased by Dr.  Luan Pulling).    . nitroGLYCERIN (NITROSTAT) 0.4 MG SL tablet Place 1 tablet (0.4 mg total) under the tongue every 5 (five) minutes as needed for chest pain. 25 tablet 3  . potassium chloride  SA (K-DUR,KLOR-CON) 20 MEQ tablet Take 20 mEq by mouth 2 (two) times daily.      No current facility-administered medications for this visit.    Discontinued Meds:    Medications Discontinued During This Encounter  Medication Reason  . amoxicillin-clavulanate (AUGMENTIN) 875-125 MG per tablet Error    Patient Active Problem List   Diagnosis Date Noted  . NSTEMI (non-ST elevated myocardial infarction) 09/05/2013  . CKD (chronic kidney disease) stage 4, GFR 15-29 ml/min 08/11/2013  . Hypokalemia 08/11/2013  . Tobacco abuse   . Hyperlipidemia   . Nephrotic syndrome   . Hypertension   . CAD (coronary artery disease)   . SCIATICA 10/30/2008  . BACK PAIN 10/30/2008    LABS    Component Value Date/Time   NA 136 06/18/2015 1602   NA 138 03/18/2014 0118   NA 138 01/14/2014 1135   K 3.5 06/18/2015 1602   K 4.0 03/18/2014 0118   K 4.3 01/14/2014 1135   CL 103 06/18/2015 1602   CL 103 03/18/2014 0118   CL 108 01/14/2014 1135   CO2 23 06/18/2015 1602   CO2 22 03/18/2014 0118  CO2 23 01/14/2014 1135   GLUCOSE 118* 06/18/2015 1602   GLUCOSE 99 03/18/2014 0118   GLUCOSE 140* 01/14/2014 1135   BUN 12 06/18/2015 1602   BUN 22 03/18/2014 0118   BUN 15 01/14/2014 1135   CREATININE 1.89* 06/18/2015 1602   CREATININE 2.34* 03/18/2014 0118   CREATININE 2.14* 01/14/2014 1135   CREATININE 2.36* 08/14/2013 2058   CALCIUM 8.3* 06/18/2015 1602   CALCIUM 8.6 03/18/2014 0118   CALCIUM 8.5 01/14/2014 1135   GFRNONAA 42* 06/18/2015 1602   GFRNONAA 33* 03/18/2014 0118   GFRNONAA 37* 01/14/2014 1135   GFRNONAA 33* 08/14/2013 2058   GFRAA 49* 06/18/2015 1602   GFRAA 38* 03/18/2014 0118   GFRAA 43* 01/14/2014 1135   GFRAA 38* 08/14/2013 2058   CMP     Component Value Date/Time   NA 136 06/18/2015 1602   K 3.5 06/18/2015 1602   CL 103 06/18/2015 1602   CO2 23 06/18/2015 1602   GLUCOSE 118* 06/18/2015 1602   BUN 12 06/18/2015 1602   CREATININE 1.89* 06/18/2015 1602   CREATININE  2.14* 01/14/2014 1135   CALCIUM 8.3* 06/18/2015 1602   PROT 5.1* 08/10/2013 0435   ALBUMIN 2.4* 08/12/2013 0426   AST 20 08/10/2013 0435   ALT 12 08/10/2013 0435   ALKPHOS 44 08/10/2013 0435   BILITOT 0.1* 08/10/2013 0435   GFRNONAA 42* 06/18/2015 1602   GFRNONAA 37* 01/14/2014 1135   GFRAA 49* 06/18/2015 1602   GFRAA 43* 01/14/2014 1135       Component Value Date/Time   WBC 20.6* 06/18/2015 1602   WBC 11.0* 03/18/2014 0118   WBC 9.6 08/14/2013 2058   HGB 15.2 06/18/2015 1602   HGB 14.0 03/18/2014 0118   HGB 12.8* 08/14/2013 2058   HCT 43.2 06/18/2015 1602   HCT 40.9 03/18/2014 0118   HCT 36.3* 08/14/2013 2058   MCV 94.7 06/18/2015 1602   MCV 93.8 03/18/2014 0118   MCV 93.1 08/14/2013 2058    Lipid Panel     Component Value Date/Time   CHOL 172 01/14/2014 1135   TRIG 197* 01/14/2014 1135   HDL 24* 01/14/2014 1135   CHOLHDL 7.2 01/14/2014 1135   VLDL 39 01/14/2014 1135   LDLCALC 109* 01/14/2014 1135    ABG    Component Value Date/Time   TCO2 23 08/19/2009 2045     Lab Results  Component Value Date   TSH 4.001 08/09/2013   BNP (last 3 results) No results for input(s): BNP in the last 8760 hours.  ProBNP (last 3 results) No results for input(s): PROBNP in the last 8760 hours.  Cardiac Panel (last 3 results) No results for input(s): CKTOTAL, CKMB, TROPONINI, RELINDX in the last 72 hours.  Iron/TIBC/Ferritin/ %Sat No results found for: IRON, TIBC, FERRITIN, IRONPCTSAT   EKG Orders placed or performed in visit on 08/20/15  . EKG 12-Lead     Prior Assessment and Plan Problem List as of 08/20/2015      Cardiovascular and Mediastinum   Hypertension   Last Assessment & Plan 09/18/2014 Office Visit Written 09/18/2014  2:01 PM by Lendon Colonel, NP    Currently well controlled. No changes in medications. His medication regimen has been adjusted by Dr. Luan Pulling. Will not make any changes.      CAD (coronary artery disease)   Last Assessment & Plan  09/18/2014 Office Visit Written 09/18/2014  2:00 PM by Lendon Colonel, NP    He is without complaints of significant chest pain. He has  occasional discomfort, but not enough to take NTG. I have asked him to refill his NTG tablet as he has had the same bottle for over 2 years. Risk factor modification is main goal. He is to continue statin, ASA, BB. Labs are being followed by Dr. Luan Pulling. Will see him again in 6 months.       NSTEMI (non-ST elevated myocardial infarction)     Nervous and Auditory   SCIATICA     Genitourinary   Nephrotic syndrome   CKD (chronic kidney disease) stage 4, GFR 15-29 ml/min     Other   BACK PAIN   Last Assessment & Plan 02/01/2014 Office Visit Written 02/01/2014  4:33 PM by Lendon Colonel, NP    Chronic for him. Defer to primary care physician for ongoing management and referral as medically necessary.      Tobacco abuse   Last Assessment & Plan 09/18/2014 Office Visit Written 09/18/2014  2:03 PM by Lendon Colonel, NP    I have asked him to quit smoking. He is cutting down. I have stressed the importance of cessation with known CAD and high probability of progressive CAD. He verbalizes understanding.      Hyperlipidemia   Hypokalemia       Imaging: No results found.

## 2015-08-20 NOTE — Progress Notes (Signed)
Cardiology Office Note   Date:  08/20/2015   ID:  Wayne Spencer, DOB [birth year], MRN [id number]  PCP:  Alonza Bogus, MD  Cardiologist:  Woodroe Chen, NP   Chief Complaint  Patient presents with  . Coronary Artery Disease  . Chest Pain      History of Present Illness: Wayne Spencer is a 44 y.o. male who presents for Wayne Spencer is a 44 y.o. male who presents for who presents for ongoing assessment and management of CAD, history of non-ST elevation MI, September 2004 with drug-eluting stents. The right coronary artery. Systolic dysfunction, with an echo in 2014 revealing an EF of 45-50% with hypokinesis of the inferior wall. Hypertension, hyperlipidemia, ongoing tobacco abuse, with chronic kidney disease.  On followup. He was counseled on smoking cessation.  And he comes today with complaints of chronic chest discomfort.  He also continues to have palpitations.  He drinks a lot of caffeine and continues to smoke.  He has not required any nitroglycerin sublingual.  He remains active, but is not adhering to a heart healthy diet.   Past Medical History  Diagnosis Date  . Cellulitis   . Hypertension   . Hyperlipidemia   . Tobacco abuse   . Chronic headache   . CAD (coronary artery disease)   . Nephrotic syndrome     Past Surgical History  Procedure Laterality Date  . Coronary stent placement    . Left heart catheterization with coronary angiogram N/A 08/10/2013    Procedure: LEFT HEART CATHETERIZATION WITH CORONARY ANGIOGRAM;  Surgeon: [name], MD;  Location[facility] CATH LAB;  Service: Cardiovascular;  Laterality: N/A;  . Percutaneous coronary stent intervention (pci-s) N/A 08/11/2013    Procedure: PERCUTANEOUS CORONARY STENT INTERVENTION (PCI-S);  Surgeon: Blane Ohara, MD;  Location: [facility] CATH LAB;  Service: Cardiovascular;  Laterality: N/A;     Current Outpatient Prescriptions  Medication Sig Dispense Refill  . ALPRAZolam (XANAX) 1 MG tablet  Take 1 mg by mouth 3 (three) times daily as needed for anxiety.    Marland Kitchen amLODipine (NORVASC) 5 MG tablet Take 1 tablet (5 mg total) by mouth daily. 30 tablet 6  . aspirin EC 81 MG EC tablet Take 1 tablet (81 mg total) by mouth daily.    Marland Kitchen atorvastatin (LIPITOR) 80 MG tablet Take 1 tablet (80 mg total) by mouth daily. 30 tablet 11  . clopidogrel (PLAVIX) 75 MG tablet Take 1 tablet (75 mg total) by mouth daily. 30 tablet 11  . cyclobenzaprine (FLEXERIL) 10 MG tablet Take 10 mg by mouth 3 (three) times daily as needed for muscle spasms.    . furosemide (LASIX) 40 MG tablet Take 40 mg by mouth every morning.     . hydrALAZINE (APRESOLINE) 50 MG tablet Take 100 mg by mouth 3 (three) times daily. Recently increased by Dr. Elmarie Shiley (kidney MD).    Marland Kitchen HYDROcodone-acetaminophen (NORCO) 7.5-325 MG per tablet Take 1 tablet by mouth every 4 (four) hours as needed for pain.    . metoprolol (LOPRESSOR) 50 MG tablet Take 100 mg by mouth 2 (two) times daily. Recently increased by Dr.  Luan Pulling).    . nitroGLYCERIN (NITROSTAT) 0.4 MG SL tablet Place 1 tablet (0.4 mg total) under the tongue every 5 (five) minutes as needed for chest pain. 25 tablet 3  . potassium chloride SA (K-DUR,KLOR-CON) 20 MEQ tablet Take 20 mEq by mouth 2 (two) times daily.      No current facility-administered medications  for this visit.    Allergies:   Mobic and Tramadol    Social History:  The patient  reports that he has been smoking Cigarettes.  He started smoking about 30 years ago. He has a 6 pack-year smoking history. He has never used smokeless tobacco. He reports that he drinks alcohol. He reports that he does not use illicit drugs.   Family History:  The patient's family history includes Heart attack in his brother, father, and sister.    ROS: All other systems are reviewed and negative. Unless otherwise mentioned in H&P    PHYSICAL EXAM: VS:  BP 122/66 mmHg  Pulse 89  Ht 6\' 3"  (1.905 m)  Wt 276 lb (125.193 kg)  BMI 34.50  kg/m2  SpO2 97% , BMI Body mass index is 34.5 kg/(m^2). GEN: Well nourished, well developed, in no acute distress HEENT: normal Neck: no JVD, carotid bruits, or masses Cardiac: RRR; no murmurs, rubs, or gallops,no edema  Respiratory:  clear to auscultation bilaterally, normal work of breathing GI: soft, nontender, nondistended, + BS MS: no deformity or atrophy Skin: warm and dry, no rash Neuro:  Strength and sensation are intact Psych: euthymic mood, full affect   Recent Labs: 06/18/2015: BUN 12; Creatinine, Ser 1.89*; Hemoglobin 15.2; Platelets 140*; Potassium 3.5; Sodium 136    Lipid Panel    Component Value Date/Time   CHOL 172 01/14/2014 1135   TRIG 197* 01/14/2014 1135   HDL 24* 01/14/2014 1135   CHOLHDL 7.2 01/14/2014 1135   VLDL 39 01/14/2014 1135   LDLCALC 109* 01/14/2014 1135      Wt Readings from Last 3 Encounters:  08/20/15 276 lb (125.193 kg)  06/18/15 270 lb (122.471 kg)  02/20/15 271 lb (122.925 kg)     ASSESSMENT AND PLAN:  1. Coronary artery disease: History of non-ST elevation MI in 2014, the patient has drug-eluting stents, and continues on Plavix and aspirin.  He also continues on statin therapy.  He continues on beta blocker.  I will not make any changes in his medication regimen at this time.  I advised him on reduction of caffeine and to increase his exercise.  He verbalizes understanding  2. Ongoing tobacco abuse: the patient, unfortunately, continues to smoke.  He is ready to quit and is gotten a CD from a friend that uses hypnosis in order to help with smoking cessation.  I have encouraged his lifestyle change, but uncertain, but this hypnosis for work for him  3. Hypertension:blood pressure is well-controlled currently.  Will not make any changes in medication regimen.  He is advised to lose weight and to increase his activity.  Current medicines are reviewed at length with the patient today.    Labs/ tests ordered today include: None   Orders  Placed This Encounter  Procedures  . EKG 12-Lead     Disposition:   FU with Cardiology in 6 months unless symptomatic   Signed, Jory Sims, NP  08/20/2015 5:26 PM    [facility] [address] [place], [place] [address] Phone: [phone]; Fax: [phone]

## 2016-01-22 ENCOUNTER — Encounter: Payer: Self-pay | Admitting: Adult Health

## 2016-01-22 ENCOUNTER — Ambulatory Visit (INDEPENDENT_AMBULATORY_CARE_PROVIDER_SITE_OTHER): Payer: Self-pay | Admitting: Adult Health

## 2016-01-22 VITALS — BP 128/84 | HR 86 | Ht 75.5 in | Wt 291.0 lb

## 2016-01-22 DIAGNOSIS — M542 Cervicalgia: Secondary | ICD-10-CM

## 2016-01-22 MED ORDER — NICOTINE 21 MG/24HR TD PT24: 21.0000 mg | MEDICATED_PATCH | Freq: Every day | TRANSDERMAL | Status: DC

## 2016-01-22 MED ORDER — NICOTINE 7 MG/24HR TD PT24: 7.0000 mg | MEDICATED_PATCH | Freq: Every day | TRANSDERMAL | Status: DC

## 2016-01-22 MED ORDER — NICOTINE 14 MG/24HR TD PT24: 14.0000 mg | MEDICATED_PATCH | Freq: Every day | TRANSDERMAL | Status: DC

## 2016-01-22 MED ORDER — RANOLAZINE ER 500 MG PO TB12: 500.0000 mg | ORAL_TABLET | Freq: Two times a day (BID) | ORAL | Status: DC

## 2016-01-22 NOTE — Patient Instructions (Signed)
Your physician wants you to follow-up in:  6 months with Arnold Long NP You will receive a reminder letter in the mail two months in advance. If you don't receive a letter, please call our office to schedule the follow-up appointment.   START Ranexa 500 mg twice a day for chest pain   Take Nicoderm as directed   Get neck x-ray today    If you need a refill on your cardiac medications before your next appointment, please call your pharmacy.     Thank you for choosing [facility] !

## 2016-01-22 NOTE — Progress Notes (Signed)
Name: Wayne Spencer    DOB: Apr 04, 1971  Age: 45 y.o.  MR#: [id number]       PCP:  Alonza Bogus, MD      Insurance: Payor: / No coverage found.  CC:   No chief complaint on file.   VS Filed Vitals:   01/22/16 1433  BP: 128/84  Pulse: 86  Height: 6' 3.5" (1.918 m)  Weight: 291 lb (131.997 kg)  SpO2: 97%    Weights Current Weight  01/22/16 291 lb (131.997 kg)  08/20/15 276 lb (125.193 kg)  06/18/15 270 lb (122.471 kg)    Blood Pressure  BP Readings from Last 3 Encounters:  01/22/16 128/84  08/20/15 122/66  06/18/15 121/82     Admit date:  (Not on file) Last encounter with RMR:  08/20/2015   Allergy Mobic and Tramadol  Current Outpatient Prescriptions  Medication Sig Dispense Refill  . ALPRAZolam (XANAX) 1 MG tablet Take 1 mg by mouth 3 (three) times daily as needed for anxiety.    Marland Kitchen amLODipine (NORVASC) 5 MG tablet Take 1 tablet (5 mg total) by mouth daily. 30 tablet 6  . aspirin EC 81 MG EC tablet Take 1 tablet (81 mg total) by mouth daily.    Marland Kitchen atorvastatin (LIPITOR) 80 MG tablet Take 1 tablet (80 mg total) by mouth daily. 30 tablet 11  . clopidogrel (PLAVIX) 75 MG tablet Take 1 tablet (75 mg total) by mouth daily. 30 tablet 11  . cyclobenzaprine (FLEXERIL) 10 MG tablet Take 10 mg by mouth 3 (three) times daily as needed for muscle spasms.    . furosemide (LASIX) 40 MG tablet Take 40 mg by mouth every morning.     . hydrALAZINE (APRESOLINE) 50 MG tablet Take 100 mg by mouth 3 (three) times daily. Recently increased by Dr. Elmarie Shiley (kidney MD).    Marland Kitchen HYDROcodone-acetaminophen (NORCO) 7.5-325 MG per tablet Take 1 tablet by mouth every 4 (four) hours as needed for pain.    . metoprolol (LOPRESSOR) 50 MG tablet Take 100 mg by mouth 2 (two) times daily. Recently increased by Dr.  Luan Pulling).    . nitroGLYCERIN (NITROSTAT) 0.4 MG SL tablet Place 1 tablet (0.4 mg total) under the tongue every 5 (five) minutes as needed for chest pain. 25 tablet 3  . potassium chloride SA  (K-DUR,KLOR-CON) 20 MEQ tablet Take 20 mEq by mouth 2 (two) times daily.      No current facility-administered medications for this visit.    Discontinued Meds:   There are no discontinued medications.  Patient Active Problem List   Diagnosis Date Noted  . NSTEMI (non-ST elevated myocardial infarction) ([facility]) 09/05/2013  . CKD (chronic kidney disease) stage 4, GFR 15-29 ml/min (HCC) 08/11/2013  . Hypokalemia 08/11/2013  . Tobacco abuse   . Hyperlipidemia   . Nephrotic syndrome   . Hypertension   . CAD (coronary artery disease)   . SCIATICA 10/30/2008  . BACK PAIN 10/30/2008    LABS    Component Value Date/Time   NA 136 06/18/2015 1602   NA 138 03/18/2014 0118   NA 138 01/14/2014 1135   K 3.5 06/18/2015 1602   K 4.0 03/18/2014 0118   K 4.3 01/14/2014 1135   CL 103 06/18/2015 1602   CL 103 03/18/2014 0118   CL 108 01/14/2014 1135   CO2 23 06/18/2015 1602   CO2 22 03/18/2014 0118   CO2 23 01/14/2014 1135   GLUCOSE 118* 06/18/2015 1602   GLUCOSE 99 03/18/2014 0118  GLUCOSE 140* 01/14/2014 1135   BUN 12 06/18/2015 1602   BUN 22 03/18/2014 0118   BUN 15 01/14/2014 1135   CREATININE 1.89* 06/18/2015 1602   CREATININE 2.34* 03/18/2014 0118   CREATININE 2.14* 01/14/2014 1135   CREATININE 2.36* 08/14/2013 2058   CALCIUM 8.3* 06/18/2015 1602   CALCIUM 8.6 03/18/2014 0118   CALCIUM 8.5 01/14/2014 1135   GFRNONAA 42* 06/18/2015 1602   GFRNONAA 33* 03/18/2014 0118   GFRNONAA 37* 01/14/2014 1135   GFRNONAA 33* 08/14/2013 2058   GFRAA 49* 06/18/2015 1602   GFRAA 38* 03/18/2014 0118   GFRAA 43* 01/14/2014 1135   GFRAA 38* 08/14/2013 2058   CMP     Component Value Date/Time   NA 136 06/18/2015 1602   K 3.5 06/18/2015 1602   CL 103 06/18/2015 1602   CO2 23 06/18/2015 1602   GLUCOSE 118* 06/18/2015 1602   BUN 12 06/18/2015 1602   CREATININE 1.89* 06/18/2015 1602   CREATININE 2.14* 01/14/2014 1135   CALCIUM 8.3* 06/18/2015 1602   PROT 5.1* 08/10/2013 0435   ALBUMIN  2.4* 08/12/2013 0426   AST 20 08/10/2013 0435   ALT 12 08/10/2013 0435   ALKPHOS 44 08/10/2013 0435   BILITOT 0.1* 08/10/2013 0435   GFRNONAA 42* 06/18/2015 1602   GFRNONAA 37* 01/14/2014 1135   GFRAA 49* 06/18/2015 1602   GFRAA 43* 01/14/2014 1135       Component Value Date/Time   WBC 20.6* 06/18/2015 1602   WBC 11.0* 03/18/2014 0118   WBC 9.6 08/14/2013 2058   HGB 15.2 06/18/2015 1602   HGB 14.0 03/18/2014 0118   HGB 12.8* 08/14/2013 2058   HCT 43.2 06/18/2015 1602   HCT 40.9 03/18/2014 0118   HCT 36.3* 08/14/2013 2058   MCV 94.7 06/18/2015 1602   MCV 93.8 03/18/2014 0118   MCV 93.1 08/14/2013 2058    Lipid Panel     Component Value Date/Time   CHOL 172 01/14/2014 1135   TRIG 197* 01/14/2014 1135   HDL 24* 01/14/2014 1135   CHOLHDL 7.2 01/14/2014 1135   VLDL 39 01/14/2014 1135   LDLCALC 109* 01/14/2014 1135    ABG    Component Value Date/Time   TCO2 23 08/19/2009 2045     Lab Results  Component Value Date   TSH 4.001 08/09/2013   BNP (last 3 results) No results for input(s): BNP in the last 8760 hours.  ProBNP (last 3 results) No results for input(s): PROBNP in the last 8760 hours.  Cardiac Panel (last 3 results) No results for input(s): CKTOTAL, CKMB, TROPONINI, RELINDX in the last 72 hours.  Iron/TIBC/Ferritin/ %Sat No results found for: IRON, TIBC, FERRITIN, IRONPCTSAT   EKG Orders placed or performed in visit on 08/20/15  . EKG 12-Lead     Prior Assessment and Plan Problem List as of 01/22/2016      Cardiovascular and Mediastinum   Hypertension   Last Assessment & Plan 09/18/2014 Office Visit Written 09/18/2014  2:01 PM by Lendon Colonel, NP    Currently well controlled. No changes in medications. His medication regimen has been adjusted by Dr. Luan Pulling. Will not make any changes.      CAD (coronary artery disease)   Last Assessment & Plan 09/18/2014 Office Visit Written 09/18/2014  2:00 PM by Lendon Colonel, NP    He is without  complaints of significant chest pain. He has occasional discomfort, but not enough to take NTG. I have asked him to refill his NTG tablet as  he has had the same bottle for over 2 years. Risk factor modification is main goal. He is to continue statin, ASA, BB. Labs are being followed by Dr. Luan Pulling. Will see him again in 6 months.       NSTEMI (non-ST elevated myocardial infarction) (HCC)     Nervous and Auditory   SCIATICA     Genitourinary   Nephrotic syndrome   CKD (chronic kidney disease) stage 4, GFR 15-29 ml/min [facility])     Other   BACK PAIN   Last Assessment & Plan 02/01/2014 Office Visit Written 02/01/2014  4:33 PM by Lendon Colonel, NP    Chronic for him. Defer to primary care physician for ongoing management and referral as medically necessary.      Tobacco abuse   Last Assessment & Plan 09/18/2014 Office Visit Written 09/18/2014  2:03 PM by Lendon Colonel, NP    I have asked him to quit smoking. He is cutting down. I have stressed the importance of cessation with known CAD and high probability of progressive CAD. He verbalizes understanding.      Hyperlipidemia   Hypokalemia       Imaging: No results found.

## 2016-01-22 NOTE — Progress Notes (Signed)
Cardiology Office Note   Date:  01/22/2016   ID:  CYRILL LUCCHESE, DOB [birth year], MRN [id number]  PCP:  Alonza Bogus, MD  Cardiologist: Woodroe Chen, NP   No chief complaint on file.     History of Present Illness: Wayne Spencer is a 45 y.o. male who presents for ongoing assessment and management of CAD, with history of non-ST elevation MI in September 2004 with drug-eluting stents to right coronary artery.  He was last seen in the office in September 2015 with complaints of chronic chest discomfort, and palpitations.  The patient continued to drink a lot of caffeine and smoke.at the time of the office visit, no changes were made in his medication.  He was advised to keep a new bottle of nitroglycerin.  If chest pain continues, may add Ranexa.  He comes today with continued chronic chest pain.  He also lifted some tires and is now pulled his left shoulder and is having spasms in between his shoulder blades and up into his neck.  He also wants to quit smoking, he is up to 2 cartons a  Week.  He is desperate to quit.  He does not have insurance and therefore, cannot afford the extensive pharmacological medications that would help him. He has applied for disability.   Past Medical History  Diagnosis Date  . Cellulitis   . Hypertension   . Hyperlipidemia   . Tobacco abuse   . Chronic headache   . CAD (coronary artery disease)   . Nephrotic syndrome     Past Surgical History  Procedure Laterality Date  . Coronary stent placement    . Left heart catheterization with coronary angiogram N/A 08/10/2013    Procedure: LEFT HEART CATHETERIZATION WITH CORONARY ANGIOGRAM;  Surgeon: [name], MD;  [facility] CATH LAB;  Service: Cardiovascular;  Laterality: N/A;  . Percutaneous coronary stent intervention (pci-s) N/A 08/11/2013    Procedure: PERCUTANEOUS CORONARY STENT INTERVENTION (PCI-S);  Surgeon: Blane Ohara, MD;  Location: [facility] CATH LAB;  Service:  Cardiovascular;  Laterality: N/A;     Current Outpatient Prescriptions  Medication Sig Dispense Refill  . ALPRAZolam (XANAX) 1 MG tablet Take 1 mg by mouth 3 (three) times daily as needed for anxiety.    Marland Kitchen amLODipine (NORVASC) 5 MG tablet Take 1 tablet (5 mg total) by mouth daily. 30 tablet 6  . aspirin EC 81 MG EC tablet Take 1 tablet (81 mg total) by mouth daily.    Marland Kitchen atorvastatin (LIPITOR) 80 MG tablet Take 1 tablet (80 mg total) by mouth daily. 30 tablet 11  . clopidogrel (PLAVIX) 75 MG tablet Take 1 tablet (75 mg total) by mouth daily. 30 tablet 11  . cyclobenzaprine (FLEXERIL) 10 MG tablet Take 10 mg by mouth 3 (three) times daily as needed for muscle spasms.    . furosemide (LASIX) 40 MG tablet Take 40 mg by mouth every morning.     . hydrALAZINE (APRESOLINE) 50 MG tablet Take 100 mg by mouth 3 (three) times daily. Recently increased by Dr. Elmarie Shiley (kidney MD).    Marland Kitchen HYDROcodone-acetaminophen (NORCO) 7.5-325 MG per tablet Take 1 tablet by mouth every 4 (four) hours as needed for pain.    . metoprolol (LOPRESSOR) 50 MG tablet Take 100 mg by mouth 2 (two) times daily. Recently increased by Dr.  Luan Pulling).    . nitroGLYCERIN (NITROSTAT) 0.4 MG SL tablet Place 1 tablet (0.4 mg total) under the tongue every 5 (five) minutes  as needed for chest pain. 25 tablet 3  . potassium chloride SA (K-DUR,KLOR-CON) 20 MEQ tablet Take 20 mEq by mouth 2 (two) times daily.      No current facility-administered medications for this visit.    Allergies:   Mobic and Tramadol    Social History:  The patient  reports that he has been smoking Cigarettes.  He started smoking about 30 years ago. He has a 6 pack-year smoking history. He has never used smokeless tobacco. He reports that he drinks alcohol. He reports that he does not use illicit drugs.   Family History:  The patient's family history includes Heart attack in his brother, father, and sister.    ROS: All other systems are reviewed and negative.  Unless otherwise mentioned in H&P    PHYSICAL EXAM: VS:  BP 128/84 mmHg  Pulse 86  Ht 6' 3.5" (1.918 m)  Wt 291 lb (131.997 kg)  BMI 35.88 kg/m2  SpO2 97% , BMI Body mass index is 35.88 kg/(m^2). GEN: Well nourished, well developed, in no acute distress HEENT: normal Neck: no JVD, carotid bruits, or masses Cardiac: RRR; no murmurs, rubs, or gallops,no edema  Respiratory:  clear to auscultation bilaterally, normal work of breathing GI: soft, nontender, nondistended, + BS MS: no deformity or atrophypain between the shoulder blades with palpation into the left subscapular area and upper shoulder into the trapezius area. Skin: warm and dry, no rash Neuro:  Strength and sensation are intact Psych: euthymic mood, full affect   Recent Labs: 06/18/2015: BUN 12; Creatinine, Ser 1.89*; Hemoglobin 15.2; Platelets 140*; Potassium 3.5; Sodium 136    Lipid Panel    Component Value Date/Time   CHOL 172 01/14/2014 1135   TRIG 197* 01/14/2014 1135   HDL 24* 01/14/2014 1135   CHOLHDL 7.2 01/14/2014 1135   VLDL 39 01/14/2014 1135   LDLCALC 109* 01/14/2014 1135      Wt Readings from Last 3 Encounters:  01/22/16 291 lb (131.997 kg)  08/20/15 276 lb (125.193 kg)  06/18/15 270 lb (122.471 kg)      ASSESSMENT AND PLAN:  1. Chronic chest pain: This occurs with minimal exertion. I will start him on 500 mg by mouth twice a day.samples were provided.  He will continue antianginal medications, amlodipine, and metoprolol. He often gets a headache that is severe with use of nitroglycerin, sublingual, and, therefore, we will not add nitrates to his regimen  2. Musculoskeletal pain:  He has had recent injury to his upper left shoulder, but continues to have chronic pain in his back and shoulders.  I will lower her cervical spine with results to Dr. Luan Pulling for ongoing followup and changes in medication regimen.  3. Coronary artery disease: Stenosis non-ST elevation MI in October 2014. Drug-eluting  stent to the distal right coronary artery.He will continue statin therapy, dual antiplatelet therapy, beta blocker . 4. Tobacco abuse:we have discussed pharmacological assistance with Chantix, but he has no insurance is unable to pay for it.  I will start him on a nicotine patch stepped-down program.  He is advised to follow instructions carefully and do his best to cut down on smoking.   Current medicines are reviewed at length with the patient today.    Labs/ tests ordered today include: cervical spine series. No orders of the defined types were placed in this encounter.     Disposition:   FU with  6 months Signed, Jory Sims, NP  01/22/2016 2:39 PM    [facility] [address], [place], [place] [address] Phone: [phone]; Fax: [phone]

## 2016-08-10 NOTE — Progress Notes (Signed)
Cardiology Office Note   Date:  08/11/2016   ID:  Wayne Spencer, DOB [birth year], MRN [id number]  PCP:  Alonza Bogus, MD  Cardiologist: Woodroe Chen, NP   Chief Complaint  Patient presents with  . Coronary Artery Disease  . Hypertension      History of Present Illness: Wayne Spencer is a 45 y.o. male who presents for  ongoing assessment and management of CAD, with history of non-ST elevation MI in September 2004 with drug-eluting stents to right coronary artery.  He was last seen in the office in September 2015 with complaints of chronic chest discomfort, and palpitations.  The patient continued to drink a lot of caffeine and smoke.at the time of the office visit on 01/22/2016. He has applied for disability for chronic pain. He was started on a nicotine patch.   He unfortunately continues to smoke and is unable to afford nicotine patches. He continues to apply for disability. He is concerned about his weight gain, he is gained approximately 22 pounds from this time last year until present. He states he sits around a lot and eats and does not exercise. He is aware of the risks involved in doing so but is finding it hard to stay motivated to change his overall lifestyle. He has chronic pain with any exertion.   Past Medical History:  Diagnosis Date  . CAD (coronary artery disease)   . Cellulitis   . Chronic headache   . Hyperlipidemia   . Hypertension   . Nephrotic syndrome   . Tobacco abuse     Past Surgical History:  Procedure Laterality Date  . CORONARY STENT PLACEMENT    . LEFT HEART CATHETERIZATION WITH CORONARY ANGIOGRAM N/A 08/10/2013   Procedure: LEFT HEART CATHETERIZATION WITH CORONARY ANGIOGRAM;  Surgeon: [name], MD;  Location: [facility] CATH LAB;  Service: Cardiovascular;  Laterality: N/A;  . PERCUTANEOUS CORONARY STENT INTERVENTION (PCI-S) N/A 08/11/2013   Procedure: PERCUTANEOUS CORONARY STENT INTERVENTION (PCI-S);  Surgeon: Blane Ohara, MD;   Location: [facility] CATH LAB;  Service: Cardiovascular;  Laterality: N/A;     Current Outpatient Prescriptions  Medication Sig Dispense Refill  . ALPRAZolam (XANAX) 1 MG tablet Take 1 mg by mouth 3 (three) times daily as needed for anxiety.    Marland Kitchen amLODipine (NORVASC) 5 MG tablet Take 1 tablet (5 mg total) by mouth daily. 30 tablet 6  . aspirin EC 81 MG EC tablet Take 1 tablet (81 mg total) by mouth daily.    Marland Kitchen atorvastatin (LIPITOR) 80 MG tablet Take 1 tablet (80 mg total) by mouth daily. 30 tablet 11  . clopidogrel (PLAVIX) 75 MG tablet Take 1 tablet (75 mg total) by mouth daily. 30 tablet 11  . cyclobenzaprine (FLEXERIL) 10 MG tablet Take 10 mg by mouth 3 (three) times daily as needed for muscle spasms.    . furosemide (LASIX) 40 MG tablet Take 40 mg by mouth every morning.     . hydrALAZINE (APRESOLINE) 50 MG tablet Take 100 mg by mouth 3 (three) times daily. Recently increased by Dr. Elmarie Shiley (kidney MD).    Marland Kitchen HYDROcodone-acetaminophen (NORCO) 7.5-325 MG per tablet Take 1 tablet by mouth every 4 (four) hours as needed for pain.    . metoprolol (LOPRESSOR) 50 MG tablet Take 100 mg by mouth 2 (two) times daily. Recently increased by Dr.  Luan Pulling).    . nicotine (NICODERM CQ) 14 mg/24hr patch Place 1 patch (14 mg total) onto the skin daily. [place]  patch 0  . nicotine (NICODERM CQ) 21 mg/24hr patch Place 1 patch (21 mg total) onto the skin daily. 42 patch 0  . nicotine (NICODERM CQ) 7 mg/24hr patch Place 1 patch (7 mg total) onto the skin daily. 14 patch 0  . nitroGLYCERIN (NITROSTAT) 0.4 MG SL tablet Place 1 tablet (0.4 mg total) under the tongue every 5 (five) minutes as needed for chest pain. 25 tablet 3  . potassium chloride SA (K-DUR,KLOR-CON) 20 MEQ tablet Take 20 mEq by mouth 2 (two) times daily.     . ranolazine (RANEXA) 500 MG 12 hr tablet Take 1 tablet (500 mg total) by mouth 2 (two) times daily. 60 tablet 3   No current facility-administered medications for this visit.     Allergies:    Mobic [meloxicam] and Tramadol    Social History:  The patient  reports that he has been smoking Cigarettes.  He started smoking about 31 years ago. He has a 6.00 pack-year smoking history. He has never used smokeless tobacco. He reports that he drinks alcohol. He reports that he does not use drugs.   Family History:  The patient's family history includes Heart attack in his brother, father, and sister.    ROS: All other systems are reviewed and negative. Unless otherwise mentioned in H&P    PHYSICAL EXAM: VS:  BP (!) 142/82   Pulse (!) 54   Ht 6' 1.5" (1.867 m)   Wt 298 lb (135.2 kg)   SpO2 97%   BMI 38.78 kg/m  , BMI Body mass index is 38.78 kg/m. GEN: Well nourished, well developed, in no acute distress  HEENT: normal  Neck: no JVD, carotid bruits, or masses Cardiac: RRR; no murmurs, rubs, or gallops,no edema  Respiratory:  clear to auscultation bilaterally, normal work of breathing GI: soft, nontender, nondistended, + BS obese. MS: no deformity or atrophy  Skin: warm and dry, no rash Neuro:  Strength and sensation are intact Psych: euthymic mood, full affect    Lipid Panel    Component Value Date/Time   CHOL 172 01/14/2014 1135   TRIG 197 (H) 01/14/2014 1135   HDL 24 (L) 01/14/2014 1135   CHOLHDL 7.2 01/14/2014 1135   VLDL 39 01/14/2014 1135   LDLCALC 109 (H) 01/14/2014 1135      Wt Readings from Last 3 Encounters:  08/11/16 298 lb (135.2 kg)  01/22/16 291 lb (132 kg)  08/20/15 276 lb (125.2 kg)     ASSESSMENT AND PLAN:  1.  Coronary artery disease: He is not having any angina symptoms. He has not had to take nitroglycerin. I will continue him on metoprolol, aspirin, and statin. Labs are completed by his primary care physician Dr. Luan Pulling on follow-up visit due in the next month.  2. Ongoing tobacco abuse: I have again done smoking cessation counseling with this gentleman. He is aware of the risks involved and continued to smoke with known coronary artery  disease and hypertension. He really wants to quit but is having difficulty standing motivated. He was unable to afford nicotine patches. I will continue to reinforce smoking cessation on each visit.  3. Hypertension: Mildly elevated on this visit. Will not make any changes in his medications at this time. Continue to follow him. He will need to have labs completed a BMET, as well as cholesterol studies. He states he is seeing his primary care physician again on next couple of weeks for labs   Current medicines are reviewed at length with the  patient today.    Labs/ tests ordered today include:  No orders of the defined types were placed in this encounter.    Disposition:   FU with 6 months  Signed, Jory Sims, NP  08/11/2016 2:13 PM    [facility] [address] [place], [place] [address] Phone: [phone]; Fax: [phone]

## 2016-08-11 ENCOUNTER — Ambulatory Visit (INDEPENDENT_AMBULATORY_CARE_PROVIDER_SITE_OTHER): Payer: Self-pay | Admitting: Adult Health

## 2016-08-11 ENCOUNTER — Encounter: Payer: Self-pay | Admitting: Adult Health

## 2016-08-11 VITALS — BP 142/82 | HR 54 | Ht 73.5 in | Wt 298.0 lb

## 2016-08-11 DIAGNOSIS — Z720 Tobacco use: Secondary | ICD-10-CM

## 2016-08-11 DIAGNOSIS — I2510 Atherosclerotic heart disease of native coronary artery without angina pectoris: Secondary | ICD-10-CM

## 2016-08-11 DIAGNOSIS — I10 Essential (primary) hypertension: Secondary | ICD-10-CM

## 2016-08-11 NOTE — Patient Instructions (Signed)

## 2016-08-11 NOTE — Progress Notes (Signed)
Name: Wayne Spencer    DOB: 20-Aug-1971  Age: 45 y.o.  MR#: [id number]       PCP:  Alonza Bogus, MD      Insurance: Payor: / No coverage found.  CC:   No chief complaint on file.   VS Vitals:   08/11/16 1350  Weight: 298 lb (135.2 kg)  Height: 6' 1.5" (1.867 m)    Weights Current Weight  08/11/16 298 lb (135.2 kg)  01/22/16 291 lb (132 kg)  08/20/15 276 lb (125.2 kg)    Blood Pressure  BP Readings from Last 3 Encounters:  01/22/16 128/84  08/20/15 122/66  06/18/15 121/82     Admit date:  (Not on file) Last encounter with RMR:  Visit date not found   Allergy Mobic [meloxicam] and Tramadol  Current Outpatient Prescriptions  Medication Sig Dispense Refill  . ALPRAZolam (XANAX) 1 MG tablet Take 1 mg by mouth 3 (three) times daily as needed for anxiety.    Marland Kitchen amLODipine (NORVASC) 5 MG tablet Take 1 tablet (5 mg total) by mouth daily. 30 tablet 6  . aspirin EC 81 MG EC tablet Take 1 tablet (81 mg total) by mouth daily.    Marland Kitchen atorvastatin (LIPITOR) 80 MG tablet Take 1 tablet (80 mg total) by mouth daily. 30 tablet 11  . clopidogrel (PLAVIX) 75 MG tablet Take 1 tablet (75 mg total) by mouth daily. 30 tablet 11  . cyclobenzaprine (FLEXERIL) 10 MG tablet Take 10 mg by mouth 3 (three) times daily as needed for muscle spasms.    . furosemide (LASIX) 40 MG tablet Take 40 mg by mouth every morning.     . hydrALAZINE (APRESOLINE) 50 MG tablet Take 100 mg by mouth 3 (three) times daily. Recently increased by Dr. Elmarie Shiley (kidney MD).    Marland Kitchen HYDROcodone-acetaminophen (NORCO) 7.5-325 MG per tablet Take 1 tablet by mouth every 4 (four) hours as needed for pain.    . metoprolol (LOPRESSOR) 50 MG tablet Take 100 mg by mouth 2 (two) times daily. Recently increased by Dr.  Luan Pulling).    . nicotine (NICODERM CQ) 14 mg/24hr patch Place 1 patch (14 mg total) onto the skin daily. 14 patch 0  . nicotine (NICODERM CQ) 21 mg/24hr patch Place 1 patch (21 mg total) onto the skin daily. 42 patch 0  .  nicotine (NICODERM CQ) 7 mg/24hr patch Place 1 patch (7 mg total) onto the skin daily. 14 patch 0  . nitroGLYCERIN (NITROSTAT) 0.4 MG SL tablet Place 1 tablet (0.4 mg total) under the tongue every 5 (five) minutes as needed for chest pain. 25 tablet 3  . potassium chloride SA (K-DUR,KLOR-CON) 20 MEQ tablet Take 20 mEq by mouth 2 (two) times daily.     . ranolazine (RANEXA) 500 MG 12 hr tablet Take 1 tablet (500 mg total) by mouth 2 (two) times daily. 60 tablet 3   No current facility-administered medications for this visit.     Discontinued Meds:   There are no discontinued medications.  Patient Active Problem List   Diagnosis Date Noted  . NSTEMI (non-ST elevated myocardial infarction) ([place]) 09/05/2013  . CKD (chronic kidney disease) stage 4, GFR 15-29 ml/min (HCC) 08/11/2013  . Hypokalemia 08/11/2013  . Tobacco abuse   . Hyperlipidemia   . Nephrotic syndrome   . Hypertension   . CAD (coronary artery disease)   . SCIATICA 10/30/2008  . BACK PAIN 10/30/2008    LABS    Component Value Date/Time  NA 136 06/18/2015 1602   NA 138 03/18/2014 0118   NA 138 01/14/2014 1135   K 3.5 06/18/2015 1602   K 4.0 03/18/2014 0118   K 4.3 01/14/2014 1135   CL 103 06/18/2015 1602   CL 103 03/18/2014 0118   CL 108 01/14/2014 1135   CO2 23 06/18/2015 1602   CO2 22 03/18/2014 0118   CO2 23 01/14/2014 1135   GLUCOSE 118 (H) 06/18/2015 1602   GLUCOSE 99 03/18/2014 0118   GLUCOSE 140 (H) 01/14/2014 1135   BUN 12 06/18/2015 1602   BUN 22 03/18/2014 0118   BUN 15 01/14/2014 1135   CREATININE 1.89 (H) 06/18/2015 1602   CREATININE 2.34 (H) 03/18/2014 0118   CREATININE 2.14 (H) 01/14/2014 1135   CREATININE 2.36 (H) 08/14/2013 2058   CALCIUM 8.3 (L) 06/18/2015 1602   CALCIUM 8.6 03/18/2014 0118   CALCIUM 8.5 01/14/2014 1135   GFRNONAA 42 (L) 06/18/2015 1602   GFRNONAA 33 (L) 03/18/2014 0118   GFRNONAA 37 (L) 01/14/2014 1135   GFRNONAA 33 (L) 08/14/2013 2058   GFRAA 49 (L) 06/18/2015 1602    GFRAA 38 (L) 03/18/2014 0118   GFRAA 43 (L) 01/14/2014 1135   GFRAA 38 (L) 08/14/2013 2058   CMP     Component Value Date/Time   NA 136 06/18/2015 1602   K 3.5 06/18/2015 1602   CL 103 06/18/2015 1602   CO2 23 06/18/2015 1602   GLUCOSE 118 (H) 06/18/2015 1602   BUN 12 06/18/2015 1602   CREATININE 1.89 (H) 06/18/2015 1602   CREATININE 2.14 (H) 01/14/2014 1135   CALCIUM 8.3 (L) 06/18/2015 1602   PROT 5.1 (L) 08/10/2013 0435   ALBUMIN 2.4 (L) 08/12/2013 0426   AST 20 08/10/2013 0435   ALT 12 08/10/2013 0435   ALKPHOS 44 08/10/2013 0435   BILITOT 0.1 (L) 08/10/2013 0435   GFRNONAA 42 (L) 06/18/2015 1602   GFRNONAA 37 (L) 01/14/2014 1135   GFRAA 49 (L) 06/18/2015 1602   GFRAA 43 (L) 01/14/2014 1135       Component Value Date/Time   WBC 20.6 (H) 06/18/2015 1602   WBC 11.0 (H) 03/18/2014 0118   WBC 9.6 08/14/2013 2058   HGB 15.2 06/18/2015 1602   HGB 14.0 03/18/2014 0118   HGB 12.8 (L) 08/14/2013 2058   HCT 43.2 06/18/2015 1602   HCT 40.9 03/18/2014 0118   HCT 36.3 (L) 08/14/2013 2058   MCV 94.7 06/18/2015 1602   MCV 93.8 03/18/2014 0118   MCV 93.1 08/14/2013 2058    Lipid Panel     Component Value Date/Time   CHOL 172 01/14/2014 1135   TRIG 197 (H) 01/14/2014 1135   HDL 24 (L) 01/14/2014 1135   CHOLHDL 7.2 01/14/2014 1135   VLDL 39 01/14/2014 1135   LDLCALC 109 (H) 01/14/2014 1135    ABG    Component Value Date/Time   TCO2 23 08/19/2009 2045     Lab Results  Component Value Date   TSH 4.001 08/09/2013   BNP (last 3 results) No results for input(s): BNP in the last 8760 hours.  ProBNP (last 3 results) No results for input(s): PROBNP in the last 8760 hours.  Cardiac Panel (last 3 results) No results for input(s): CKTOTAL, CKMB, TROPONINI, RELINDX in the last 72 hours.  Iron/TIBC/Ferritin/ %Sat No results found for: IRON, TIBC, FERRITIN, IRONPCTSAT   EKG Orders placed or performed in visit on 08/20/15  . EKG 12-Lead     Prior Assessment and  Plan Problem  List as of 08/11/2016 Reviewed: 01/22/2016  2:59 PM by Jory Sims, NP     Cardiovascular and Mediastinum   Hypertension   Last Assessment & Plan 09/18/2014 Office Visit Written 09/18/2014  2:01 PM by Lendon Colonel, NP    Currently well controlled. No changes in medications. His medication regimen has been adjusted by Dr. Luan Pulling. Will not make any changes.      CAD (coronary artery disease)   Last Assessment & Plan 09/18/2014 Office Visit Written 09/18/2014  2:00 PM by Lendon Colonel, NP    He is without complaints of significant chest pain. He has occasional discomfort, but not enough to take NTG. I have asked him to refill his NTG tablet as he has had the same bottle for over 2 years. Risk factor modification is main goal. He is to continue statin, ASA, BB. Labs are being followed by Dr. Luan Pulling. Will see him again in 6 months.       NSTEMI (non-ST elevated myocardial infarction) (HCC)     Nervous and Auditory   SCIATICA     Genitourinary   Nephrotic syndrome   CKD (chronic kidney disease) stage 4, GFR 15-29 ml/min [facility])     Other   BACK PAIN   Last Assessment & Plan 02/01/2014 Office Visit Written 02/01/2014  4:33 PM by Lendon Colonel, NP    Chronic for him. Defer to primary care physician for ongoing management and referral as medically necessary.      Tobacco abuse   Last Assessment & Plan 09/18/2014 Office Visit Written 09/18/2014  2:03 PM by Lendon Colonel, NP    I have asked him to quit smoking. He is cutting down. I have stressed the importance of cessation with known CAD and high probability of progressive CAD. He verbalizes understanding.      Hyperlipidemia   Hypokalemia       Imaging: No results found.

## 2017-01-20 ENCOUNTER — Encounter (HOSPITAL_COMMUNITY): Payer: Self-pay | Admitting: *Deleted

## 2017-01-20 ENCOUNTER — Emergency Department (HOSPITAL_COMMUNITY): Payer: Self-pay

## 2017-01-20 ENCOUNTER — Emergency Department (HOSPITAL_COMMUNITY)
Admission: EM | Admit: 2017-01-20 | Discharge: 2017-01-20 | Disposition: A | Discharge status: Home / Self Care | Payer: Self-pay | Attending: Emergency Medicine | Admitting: Emergency Medicine

## 2017-01-20 DIAGNOSIS — I129 Hypertensive chronic kidney disease with stage 1 through stage 4 chronic kidney disease, or unspecified chronic kidney disease: Secondary | ICD-10-CM | POA: Insufficient documentation

## 2017-01-20 DIAGNOSIS — R52 Pain, unspecified: Secondary | ICD-10-CM

## 2017-01-20 DIAGNOSIS — M549 Dorsalgia, unspecified: Secondary | ICD-10-CM | POA: Insufficient documentation

## 2017-01-20 DIAGNOSIS — R109 Unspecified abdominal pain: Secondary | ICD-10-CM

## 2017-01-20 DIAGNOSIS — Z79899 Other long term (current) drug therapy: Secondary | ICD-10-CM | POA: Insufficient documentation

## 2017-01-20 DIAGNOSIS — N184 Chronic kidney disease, stage 4 (severe): Secondary | ICD-10-CM | POA: Insufficient documentation

## 2017-01-20 DIAGNOSIS — F17210 Nicotine dependence, cigarettes, uncomplicated: Secondary | ICD-10-CM | POA: Insufficient documentation

## 2017-01-20 DIAGNOSIS — Z7982 Long term (current) use of aspirin: Secondary | ICD-10-CM | POA: Insufficient documentation

## 2017-01-20 DIAGNOSIS — I2510 Atherosclerotic heart disease of native coronary artery without angina pectoris: Secondary | ICD-10-CM | POA: Insufficient documentation

## 2017-01-20 LAB — CBC
HCT: 47.3 % (ref 39.0–52.0)
Hemoglobin: 16.6 g/dL (ref 13.0–17.0)
MCH: 33.3 pg (ref 26.0–34.0)
MCHC: 35.1 g/dL (ref 30.0–36.0)
MCV: 95 fL (ref 78.0–100.0)
Platelets: 194 10*3/uL (ref 150–400)
RBC: 4.98 MIL/uL (ref 4.22–5.81)
RDW: 13.5 % (ref 11.5–15.5)
WBC: 7.9 10*3/uL (ref 4.0–10.5)

## 2017-01-20 LAB — TROPONIN I: Troponin I: <0.03 ng/mL (ref <0.03)

## 2017-01-20 LAB — BASIC METABOLIC PANEL
Anion gap: 6 (ref 5–15)
BUN: 14 mg/dL (ref 6–20)
CO2: 26 mmol/L (ref 22–32)
Calcium: 8.4 mg/dL — ABNORMAL LOW (ref 8.9–10.3)
Chloride: 105 mmol/L (ref 101–111)
Creatinine, Ser: 1.57 mg/dL — ABNORMAL HIGH (ref 0.61–1.24)
GFR calc Af Amer: >60 mL/min (ref >60)
GFR calc non Af Amer: 52 mL/min — ABNORMAL LOW (ref >60)
Glucose, Bld: 120 mg/dL — ABNORMAL HIGH (ref 65–99)
Potassium: 3.4 mmol/L — ABNORMAL LOW (ref 3.5–5.1)
Sodium: 137 mmol/L (ref 135–145)

## 2017-01-20 LAB — I-STAT TROPONIN, ED: Troponin i, poc: 0 ng/mL (ref 0.00–0.08)

## 2017-01-20 MED ORDER — ASPIRIN 81 MG PO CHEW: 324.0000 mg | CHEWABLE_TABLET | Freq: Once | ORAL | Status: DC

## 2017-01-20 NOTE — ED Notes (Signed)
MD at the bedside  

## 2017-01-20 NOTE — ED Notes (Signed)
Pt going to xray  

## 2017-01-20 NOTE — ED Notes (Signed)
EKG done and seen by Dr Wickline 

## 2017-01-20 NOTE — ED Provider Notes (Signed)
[place] DEPT Provider Note   CSN: [id number] Arrival date & time: 01/20/17  4967     History   Chief Complaint Chief Complaint  Patient presents with  . Back Pain    HPI ABDURAHMAN RUGG is a 46 y.o. male.  HPI  The patient is a 46 year old male, known to have coronary disease, known to have chronic hypertension, hyperlipidemia, known stenting most recently in 2014 and followed by local cardiology. He still smokes approximately one to one and a half packs of cigarettes a day and has a chronic cough. He reports that 3 weeks ago he had a particular severe coughing spell which resulted in some back pain which had tapered off over the last 3 weeks until today at 4 AM when he got up to use the bathroom, had another coughing spell which caused acute onset of sharp and stabbing pain in his left flank area radiating somewhat around towards his chest. This seems to be positional, it is worse when he takes a deep breath, it does not appear to be exertional and he denies having any exertional symptoms recently. Of note, review of the medical record shows that the patient has been seen in the cardiology office most recently in September 2017 at which time he was not having anginal pains, he had been applying for disability secondary to his chronic pain for sometime before that and has not yet received it.  Past Medical History:  Diagnosis Date  . CAD (coronary artery disease)   . Cellulitis   . Chronic headache   . Hyperlipidemia   . Hypertension   . Nephrotic syndrome   . Tobacco abuse     Patient Active Problem List   Diagnosis Date Noted  . NSTEMI (non-ST elevated myocardial infarction) ([place]) 09/05/2013  . CKD (chronic kidney disease) stage 4, GFR 15-29 ml/min (HCC) 08/11/2013  . Hypokalemia 08/11/2013  . Tobacco abuse   . Hyperlipidemia   . Nephrotic syndrome   . Hypertension   . CAD (coronary artery disease)   . SCIATICA 10/30/2008  . BACK PAIN 10/30/2008    Past Surgical  History:  Procedure Laterality Date  . CORONARY STENT PLACEMENT    . LEFT HEART CATHETERIZATION WITH CORONARY ANGIOGRAM N/A 08/10/2013   Procedure: LEFT HEART CATHETERIZATION WITH CORONARY ANGIOGRAM;  Surgeon: [name], MD;  Location: [facility] C[facility] LAB;  Service: Cardiovascular;  Laterality: N/A;  . PERCUTANEOUS CORONARY STENT INTERVENTION (PCI-S) N/A 08/11/2013   Procedure: PERCUTANEOUS CORONARY STENT INTERVENTION (PCI-S);  Surgeon: Blane Ohara, MD;  Location: [facility] CATH LAB;  Service: Cardiovascular;  Laterality: N/A;       Home Medications    Prior to Admission medications   Medication Sig Start Date End Date Taking? Authorizing Provider  acetaminophen (TYLENOL) 500 MG tablet Take 1,000 mg by mouth every 6 (six) hours as needed for headache.   Yes Historical Provider, MD  albuterol (PROVENTIL HFA;VENTOLIN HFA) 108 (90 Base) MCG/ACT inhaler Inhale 1-2 puffs into the lungs every 6 (six) hours as needed for wheezing or shortness of breath.   Yes Historical Provider, MD  ALPRAZolam Duanne Moron) 1 MG tablet Take 1 mg by mouth 3 (three) times daily as needed for anxiety.   Yes Historical Provider, MD  amLODipine (NORVASC) 5 MG tablet Take 1 tablet (5 mg total) by mouth daily. 01/09/14  Yes Arnoldo Lenis, MD  aspirin EC 81 MG EC tablet Take 1 tablet (81 mg total) by mouth daily. 08/12/13  Yes Evelene Croon Barrett, PA-C  atorvastatin (LIPITOR) 80 MG tablet Take 1 tablet (80 mg total) by mouth daily. 08/12/13  Yes Rhonda G Barrett, PA-C  clopidogrel (PLAVIX) 75 MG tablet Take 1 tablet (75 mg total) by mouth daily. 08/12/13  Yes Rhonda G Barrett, PA-C  cyclobenzaprine (FLEXERIL) 10 MG tablet Take 10 mg by mouth 3 (three) times daily as needed for muscle spasms.   Yes Historical Provider, MD  furosemide (LASIX) 40 MG tablet Take 40 mg by mouth every morning.    Yes Historical Provider, MD  hydrALAZINE (APRESOLINE) 50 MG tablet Take 100 mg by mouth 3 (three) times daily. Recently increased by Dr. Elmarie Shiley  (kidney MD).   Yes Historical Provider, MD  HYDROcodone-acetaminophen (NORCO) 7.5-325 MG per tablet Take 1 tablet by mouth every 4 (four) hours as needed for pain.   Yes Historical Provider, MD  metoprolol (LOPRESSOR) 50 MG tablet Take 100 mg by mouth 2 (two) times daily. Recently increased by Dr.  Luan Pulling).   Yes Historical Provider, MD  nitroGLYCERIN (NITROSTAT) 0.4 MG SL tablet Place 1 tablet (0.4 mg total) under the tongue every 5 (five) minutes as needed for chest pain. 09/18/14  Yes Lendon Colonel, NP  potassium chloride SA (K-DUR,KLOR-CON) 20 MEQ tablet Take 20 mEq by mouth 2 (two) times daily.    Yes Historical Provider, MD    Family History Family History  Problem Relation Age of Onset  . Heart attack Father     1s  . Heart attack Brother     28s, s/p CABG, passed from MI at 43  . Heart attack Sister     44s    Social History Social History  Substance Use Topics  . Smoking status: Current Every Day Smoker    Packs/day: 1.00    Years: 6.00    Types: Cigarettes    Start date: 02/19/1985  . Smokeless tobacco: Never Used  . Alcohol use 0.0 oz/week     Comment: occasional     Allergies   Mobic [meloxicam] and Tramadol   Review of Systems Review of Systems  All other systems reviewed and are negative.    Physical Exam Updated Vital Signs BP 115/66   Pulse 79   Resp 14   Ht 6' 1.5" (1.867 m)   Wt 298 lb (135.2 kg)   SpO2 94%   BMI 38.78 kg/m   Physical Exam  Constitutional: He appears well-developed and well-nourished. No distress.  HENT:  Head: Normocephalic and atraumatic.  Mouth/Throat: Oropharynx is clear and moist. No oropharyngeal exudate.  Eyes: Conjunctivae and EOM are normal. Pupils are equal, round, and reactive to light. Right eye exhibits no discharge. Left eye exhibits no discharge. No scleral icterus.  Neck: Normal range of motion. Neck supple. No JVD present. No thyromegaly present.  Cardiovascular: Normal rate, regular rhythm, normal  heart sounds and intact distal pulses.  Exam reveals no gallop and no friction rub.   No murmur heard. Occasional ectopy  Pulmonary/Chest: Effort normal and breath sounds normal. No respiratory distress. He has no wheezes. He has no rales.  Abdominal: Soft. Bowel sounds are normal. He exhibits no distension and no mass. There is no tenderness.  Musculoskeletal: Normal range of motion. He exhibits edema ( mild symmetrical bialteral LE edema) and tenderness ( ttp over the L lateral back over the lower ribs posterilrly).  Lymphadenopathy:    He has no cervical adenopathy.  Neurological: He is alert. Coordination normal.  Skin: Skin is warm and dry. No rash noted. No  erythema.  Psychiatric: He has a normal mood and affect. His behavior is normal.  Nursing note and vitals reviewed.    ED Treatments / Results  Labs (all labs ordered are listed, but only abnormal results are displayed) Labs Reviewed  BASIC METABOLIC PANEL - Abnormal; Notable for the following:       Result Value   Potassium 3.4 (*)    Glucose, Bld 120 (*)    Creatinine, Ser 1.57 (*)    Calcium 8.4 (*)    GFR calc non Af Amer 52 (*)    All other components within normal limits  CBC  TROPONIN I  I-STAT TROPOININ, ED    EKG  EKG Interpretation  Date/Time:  Tuesday January 20 2017 06:51:02 EST Ventricular Rate:  104 PR Interval:    QRS Duration: 109 QT Interval:  400 QTC Calculation: 527 R Axis:   62 Text Interpretation:  Sinus tachycardia Prolonged QT interval Baseline wander in lead(s) I II aVR aVF V2 V6 wander still preasent, no obvious STEMI Confirmed by Jaquavion Mccannon  MD, Marc Leichter ([id number]) on 01/20/2017 7:00:31 AM       Radiology Dg Chest 2 View  Result Date: 01/20/2017 CLINICAL DATA:  46 year old male with a history of severe back pain radiating to chest EXAM: CHEST  2 VIEW COMPARISON:  08/14/2013 FINDINGS: Cardiomediastinal silhouette unchanged. No central vascular congestion. No interlobular septal thickening.  Stigmata of emphysema, with increased retrosternal airspace, flattened hemidiaphragms, increased AP diameter, and hyperinflation on the AP view. No confluent airspace disease.  No pleural effusion or pneumothorax. No displaced fracture. Degenerative changes of thoracic spine. IMPRESSION: Changes of emphysema with no radiographic evidence of superimposed acute cardiopulmonary disease. Signed, Dulcy Fanny. Earleen Newport, DO [facility] Radiology Electronically Signed   By: Corrie Mckusick D.O.   On: 01/20/2017 07:58   Dg Ribs Unilateral Left  Result Date: 01/20/2017 CLINICAL DATA:  Severe pain.  No injury. EXAM: LEFT RIBS - 2 VIEW COMPARISON:  01/20/2017. FINDINGS: No evidence of displaced rib fracture. No focal bony abnormality identified. Mild left base subsegmental atelectasis. IMPRESSION: No evidence of displaced rib fracture.  No focal bony abnormality. Electronically Signed   By: Marcello Moores  Register   On: 01/20/2017 08:27    Procedures Procedures (including critical care time)  Medications Ordered in ED Medications  aspirin chewable tablet 324 mg (324 mg Oral Not Given 01/20/17 0806)     Initial Impression / Assessment and Plan / ED Course  I have reviewed the triage vital signs and the nursing notes.  Pertinent labs & imaging results that were available during my care of the patient were reviewed by me and considered in my medical decision making (see chart for details).     The EKG is nonischemic, there is some frequent ectopy however there is no signs of ST elevations or ST depressions. The patient is a chronic smoker and thus at risk for coronary disease however the onset of this pain and the positional nature of it seems to be more muscular, rib related or possibly lung related and I would consider pneumothorax is a real possibility given his underlying assumed emphysema. X-ray including rib x-rays will be ordered, anti-inflammatories for the pain.  Repeat EKG as the first 2 EKGs had significant Baseline wander.  No infitrates No trop elevation No concerning findings Likely MSK Pt given instructions Has chronic pain, no new Rx for pain meds  Final Clinical Impressions(s) / ED Diagnoses   Final diagnoses:  Side pain  New Prescriptions New Prescriptions   No medications on file     Noemi Chapel, MD 01/20/17 1156

## 2017-01-20 NOTE — ED Notes (Signed)
Patient given discharge instruction, verbalized understand. IV removed, band aid applied. Patient ambulatory out of the department.  

## 2017-01-20 NOTE — ED Triage Notes (Signed)
Pt c/o severe back pain that radiates around to his chest; pt c/o diaphoretic; pt has a hx of NSTEMI

## 2017-01-20 NOTE — ED Notes (Signed)
Pt taking morning meds

## 2017-02-08 NOTE — Progress Notes (Deleted)
Cardiology Office Note   Date:  02/08/2017   ID:  Wayne Spencer, DOB [birth year], MRN [id number]  PCP:  Alonza Bogus, MD  Cardiologist: Woodroe Chen, NP   No chief complaint on file.     History of Present Illness: Wayne Spencer is a 46 y.o. male who presents for ongoing assessment and management of CAD, with history of non-ST elevation MI in September 2004 with drug-eluting stents to right coronary artery. He was last seen in the office in September 2015 with complaints of chronic chest discomfort, and palpitations. The patient continued to drink a lot of caffeine and smoke. He has applied for disability for chronic pain.     Past Medical History:  Diagnosis Date  . CAD (coronary artery disease)   . Cellulitis   . Chronic headache   . Hyperlipidemia   . Hypertension   . Nephrotic syndrome   . Tobacco abuse     Past Surgical History:  Procedure Laterality Date  . CORONARY STENT PLACEMENT    . LEFT HEART CATHETERIZATION WITH CORONARY ANGIOGRAM N/A 08/10/2013   Procedure: LEFT HEART CATHETERIZATION WITH CORONARY ANGIOGRAM;  Surgeon: [name], MD;  Location: [facility] CATH LAB;  Service: Cardiovascular;  Laterality: N/A;  . PERCUTANEOUS CORONARY STENT INTERVENTION (PCI-S) N/A 08/11/2013   Procedure: PERCUTANEOUS CORONARY STENT INTERVENTION (PCI-S);  Surgeon: Blane Ohara, MD;  Location: [facility] CATH LAB;  Service: Cardiovascular;  Laterality: N/A;     Current Outpatient Prescriptions  Medication Sig Dispense Refill  . acetaminophen (TYLENOL) 500 MG tablet Take 1,000 mg by mouth every 6 (six) hours as needed for headache.    . albuterol (PROVENTIL HFA;VENTOLIN HFA) 108 (90 Base) MCG/ACT inhaler Inhale 1-2 puffs into the lungs every 6 (six) hours as needed for wheezing or shortness of breath.    . ALPRAZolam (XANAX) 1 MG tablet Take 1 mg by mouth 3 (three) times daily as needed for anxiety.    Marland Kitchen amLODipine (NORVASC) 5 MG tablet Take 1 tablet (5 mg total) by  mouth daily. 30 tablet 6  . aspirin EC 81 MG EC tablet Take 1 tablet (81 mg total) by mouth daily.    Marland Kitchen atorvastatin (LIPITOR) 80 MG tablet Take 1 tablet (80 mg total) by mouth daily. 30 tablet 11  . clopidogrel (PLAVIX) 75 MG tablet Take 1 tablet (75 mg total) by mouth daily. 30 tablet 11  . cyclobenzaprine (FLEXERIL) 10 MG tablet Take 10 mg by mouth 3 (three) times daily as needed for muscle spasms.    . furosemide (LASIX) 40 MG tablet Take 40 mg by mouth every morning.     . hydrALAZINE (APRESOLINE) 50 MG tablet Take 100 mg by mouth 3 (three) times daily. Recently increased by Dr. Elmarie Shiley (kidney MD).    Marland Kitchen HYDROcodone-acetaminophen (NORCO) 7.5-325 MG per tablet Take 1 tablet by mouth every 4 (four) hours as needed for pain.    . metoprolol (LOPRESSOR) 50 MG tablet Take 100 mg by mouth 2 (two) times daily. Recently increased by Dr.  Luan Pulling).    . nitroGLYCERIN (NITROSTAT) 0.4 MG SL tablet Place 1 tablet (0.4 mg total) under the tongue every 5 (five) minutes as needed for chest pain. 25 tablet 3  . potassium chloride SA (K-DUR,KLOR-CON) 20 MEQ tablet Take 20 mEq by mouth 2 (two) times daily.      No current facility-administered medications for this visit.     Allergies:   Mobic [meloxicam] and Tramadol    Social History:  The patient  reports that he has been smoking Cigarettes.  He started smoking about 31 years ago. He has a 6.00 pack-year smoking history. He has never used smokeless tobacco. He reports that he drinks alcohol. He reports that he does not use drugs.   Family History:  The patient's family history includes Heart attack in his brother, father, and sister.    ROS: All other systems are reviewed and negative. Unless otherwise mentioned in H&P    PHYSICAL EXAM: VS:  There were no vitals taken for this visit. , BMI There is no height or weight on file to calculate BMI. GEN: Well nourished, well developed, in no acute distress HEENT: normal Neck: no JVD, carotid bruits,  or masses Cardiac: ***RRR; no murmurs, rubs, or gallops,no edema  Respiratory:  clear to auscultation bilaterally, normal work of breathing GI: soft, nontender, nondistended, + BS MS: no deformity or atrophy Skin: warm and dry, no rash Neuro:  Strength and sensation are intact Psych: euthymic mood, full affect   EKG:  EKG {ACTION; IS/IS [id number]} ordered today. The ekg ordered today demonstrates ***   Recent Labs: 01/20/2017: BUN 14; Creatinine, Ser 1.57; Hemoglobin 16.6; Platelets 194; Potassium 3.4; Sodium 137    Lipid Panel    Component Value Date/Time   CHOL 172 01/14/2014 1135   TRIG 197 (H) 01/14/2014 1135   HDL 24 (L) 01/14/2014 1135   CHOLHDL 7.2 01/14/2014 1135   VLDL 39 01/14/2014 1135   LDLCALC 109 (H) 01/14/2014 1135      Wt Readings from Last 3 Encounters:  01/20/17 298 lb (135.2 kg)  08/11/16 298 lb (135.2 kg)  01/22/16 291 lb (132 kg)      Other studies Reviewed: Additional studies/ records that were reviewed today include: ***. Review of the above records demonstrates: ***   ASSESSMENT AND PLAN:  1.  ***   Current medicines are reviewed at length with the patient today.    Labs/ tests ordered today include: *** No orders of the defined types were placed in this encounter.    Disposition:   FU with *** in {gen number [id number]} {TIME; UNITS DAY/WEEK/MONTH:19136}   Signed, Jory Sims, NP  02/08/2017 8:34 PM    [facility] [address] [place], [place] [address] Phone: [phone]; Fax: [phone]

## 2017-02-09 ENCOUNTER — Ambulatory Visit: Payer: Self-pay | Admitting: Adult Health

## 2017-02-27 ENCOUNTER — Ambulatory Visit: Payer: Self-pay | Admitting: Cardiology

## 2017-03-12 ENCOUNTER — Encounter: Payer: Self-pay | Admitting: Adult Health

## 2017-03-12 ENCOUNTER — Ambulatory Visit (INDEPENDENT_AMBULATORY_CARE_PROVIDER_SITE_OTHER): Payer: Self-pay | Admitting: Adult Health

## 2017-03-12 VITALS — BP 120/70 | HR 98 | Ht 73.5 in | Wt 299.6 lb

## 2017-03-12 DIAGNOSIS — E7800 Pure hypercholesterolemia, unspecified: Secondary | ICD-10-CM

## 2017-03-12 DIAGNOSIS — I10 Essential (primary) hypertension: Secondary | ICD-10-CM

## 2017-03-12 DIAGNOSIS — Z720 Tobacco use: Secondary | ICD-10-CM

## 2017-03-12 DIAGNOSIS — I2510 Atherosclerotic heart disease of native coronary artery without angina pectoris: Secondary | ICD-10-CM

## 2017-03-12 NOTE — Progress Notes (Signed)
Name: Wayne Spencer    DOB: 1971-02-04  Age: 46 y.o.  MR#: [id number]       PCP:  Alonza Bogus, MD      Insurance: Payor: / No coverage found.  CC:   No chief complaint on file.   VS Vitals:   03/12/17 1431  BP: 120/70  Pulse: 98  SpO2: 99%  Weight: 299 lb 9.6 oz (135.9 kg)  Height: 6' 1.5" (1.867 m)    Weights Current Weight  03/12/17 299 lb 9.6 oz (135.9 kg)  01/20/17 298 lb (135.2 kg)  08/11/16 298 lb (135.2 kg)    Blood Pressure  BP Readings from Last 3 Encounters:  03/12/17 120/70  01/20/17 115/76  08/11/16 (!) 142/82     Admit date:  (Not on file) Last encounter with RMR:  Visit date not found   Allergy Mobic [meloxicam] and Tramadol  Current Outpatient Prescriptions  Medication Sig Dispense Refill  . acetaminophen (TYLENOL) 500 MG tablet Take 1,000 mg by mouth every 6 (six) hours as needed for headache.    . albuterol (PROVENTIL HFA;VENTOLIN HFA) 108 (90 Base) MCG/ACT inhaler Inhale 1-2 puffs into the lungs every 6 (six) hours as needed for wheezing or shortness of breath.    . ALPRAZolam (XANAX) 1 MG tablet Take 1 mg by mouth 3 (three) times daily as needed for anxiety.    Marland Kitchen amLODipine (NORVASC) 5 MG tablet Take 1 tablet (5 mg total) by mouth daily. 30 tablet 6  . aspirin EC 81 MG EC tablet Take 1 tablet (81 mg total) by mouth daily.    Marland Kitchen atorvastatin (LIPITOR) 80 MG tablet Take 1 tablet (80 mg total) by mouth daily. 30 tablet 11  . clopidogrel (PLAVIX) 75 MG tablet Take 1 tablet (75 mg total) by mouth daily. 30 tablet 11  . cyclobenzaprine (FLEXERIL) 10 MG tablet Take 10 mg by mouth 3 (three) times daily as needed for muscle spasms.    . furosemide (LASIX) 40 MG tablet Take 40 mg by mouth every morning.     . hydrALAZINE (APRESOLINE) 50 MG tablet Take 100 mg by mouth 3 (three) times daily. Recently increased by Dr. Elmarie Shiley (kidney MD).    Marland Kitchen HYDROcodone-acetaminophen (NORCO) 7.5-325 MG per tablet Take 1 tablet by mouth every 4 (four) hours as needed for  pain.    . metoprolol (LOPRESSOR) 50 MG tablet Take 100 mg by mouth 2 (two) times daily. Recently increased by Dr.  Luan Pulling).    . nitroGLYCERIN (NITROSTAT) 0.4 MG SL tablet Place 1 tablet (0.4 mg total) under the tongue every 5 (five) minutes as needed for chest pain. 25 tablet 3  . potassium chloride SA (K-DUR,KLOR-CON) 20 MEQ tablet Take 20 mEq by mouth 2 (two) times daily.      No current facility-administered medications for this visit.     Discontinued Meds:   There are no discontinued medications.  Patient Active Problem List   Diagnosis Date Noted  . NSTEMI (non-ST elevated myocardial infarction) ([facility]) 09/05/2013  . CKD (chronic kidney disease) stage 4, GFR 15-29 ml/min (HCC) 08/11/2013  . Hypokalemia 08/11/2013  . Tobacco abuse   . Hyperlipidemia   . Nephrotic syndrome   . Hypertension   . CAD (coronary artery disease)   . SCIATICA 10/30/2008  . BACK PAIN 10/30/2008    LABS    Component Value Date/Time   NA 137 01/20/2017 0701   NA 136 06/18/2015 1602   NA 138 03/18/2014 0118   K 3.4 (  L) 01/20/2017 0701   K 3.5 06/18/2015 1602   K 4.0 03/18/2014 0118   CL 105 01/20/2017 0701   CL 103 06/18/2015 1602   CL 103 03/18/2014 0118   CO2 26 01/20/2017 0701   CO2 23 06/18/2015 1602   CO2 22 03/18/2014 0118   GLUCOSE 120 (H) 01/20/2017 0701   GLUCOSE 118 (H) 06/18/2015 1602   GLUCOSE 99 03/18/2014 0118   BUN 14 01/20/2017 0701   BUN 12 06/18/2015 1602   BUN 22 03/18/2014 0118   CREATININE 1.57 (H) 01/20/2017 0701   CREATININE 1.89 (H) 06/18/2015 1602   CREATININE 2.34 (H) 03/18/2014 0118   CREATININE 2.14 (H) 01/14/2014 1135   CALCIUM 8.4 (L) 01/20/2017 0701   CALCIUM 8.3 (L) 06/18/2015 1602   CALCIUM 8.6 03/18/2014 0118   GFRNONAA 52 (L) 01/20/2017 0701   GFRNONAA 42 (L) 06/18/2015 1602   GFRNONAA 33 (L) 03/18/2014 0118   GFRNONAA 37 (L) 01/14/2014 1135   GFRAA >60 01/20/2017 0701   GFRAA 49 (L) 06/18/2015 1602   GFRAA 38 (L) 03/18/2014 0118   GFRAA 43 (L)  01/14/2014 1135   CMP     Component Value Date/Time   NA 137 01/20/2017 0701   K 3.4 (L) 01/20/2017 0701   CL 105 01/20/2017 0701   CO2 26 01/20/2017 0701   GLUCOSE 120 (H) 01/20/2017 0701   BUN 14 01/20/2017 0701   CREATININE 1.57 (H) 01/20/2017 0701   CREATININE 2.14 (H) 01/14/2014 1135   CALCIUM 8.4 (L) 01/20/2017 0701   PROT 5.1 (L) 08/10/2013 0435   ALBUMIN 2.4 (L) 08/12/2013 0426   AST 20 08/10/2013 0435   ALT 12 08/10/2013 0435   ALKPHOS 44 08/10/2013 0435   BILITOT 0.1 (L) 08/10/2013 0435   GFRNONAA 52 (L) 01/20/2017 0701   GFRNONAA 37 (L) 01/14/2014 1135   GFRAA >60 01/20/2017 0701   GFRAA 43 (L) 01/14/2014 1135       Component Value Date/Time   WBC 7.9 01/20/2017 0701   WBC 20.6 (H) 06/18/2015 1602   WBC 11.0 (H) 03/18/2014 0118   HGB 16.6 01/20/2017 0701   HGB 15.2 06/18/2015 1602   HGB 14.0 03/18/2014 0118   HCT 47.3 01/20/2017 0701   HCT 43.2 06/18/2015 1602   HCT 40.9 03/18/2014 0118   MCV 95.0 01/20/2017 0701   MCV 94.7 06/18/2015 1602   MCV 93.8 03/18/2014 0118    Lipid Panel     Component Value Date/Time   CHOL 172 01/14/2014 1135   TRIG 197 (H) 01/14/2014 1135   HDL 24 (L) 01/14/2014 1135   CHOLHDL 7.2 01/14/2014 1135   VLDL 39 01/14/2014 1135   LDLCALC 109 (H) 01/14/2014 1135    ABG    Component Value Date/Time   TCO2 23 08/19/2009 2045     Lab Results  Component Value Date   TSH 4.001 08/09/2013   BNP (last 3 results) No results for input(s): BNP in the last 8760 hours.  ProBNP (last 3 results) No results for input(s): PROBNP in the last 8760 hours.  Cardiac Panel (last 3 results) No results for input(s): CKTOTAL, CKMB, TROPONINI, RELINDX in the last 72 hours.  Iron/TIBC/Ferritin/ %Sat No results found for: IRON, TIBC, FERRITIN, IRONPCTSAT   EKG Orders placed or performed during the hospital encounter of 01/20/17  . EKG 12-Lead  . EKG 12-Lead  . ED EKG within 10 minutes  . ED EKG within 10 minutes  . ED EKG  . ED EKG   .  EKG 12-Lead  . EKG 12-Lead  . EKG     Prior Assessment and Plan Problem List as of 03/12/2017 Reviewed: 08/11/2016  2:16 PM by Jory Sims, NP     Cardiovascular and Mediastinum   Hypertension   Last Assessment & Plan 09/18/2014 Office Visit Written 09/18/2014  2:01 PM by Lendon Colonel, NP    Currently well controlled. No changes in medications. His medication regimen has been adjusted by Dr. Luan Pulling. Will not make any changes.      CAD (coronary artery disease)   Last Assessment & Plan 09/18/2014 Office Visit Written 09/18/2014  2:00 PM by Lendon Colonel, NP    He is without complaints of significant chest pain. He has occasional discomfort, but not enough to take NTG. I have asked him to refill his NTG tablet as he has had the same bottle for over 2 years. Risk factor modification is main goal. He is to continue statin, ASA, BB. Labs are being followed by Dr. Luan Pulling. Will see him again in 6 months.       NSTEMI (non-ST elevated myocardial infarction) (HCC)     Nervous and Auditory   SCIATICA     Genitourinary   Nephrotic syndrome   CKD (chronic kidney disease) stage 4, GFR 15-29 ml/min [facility])     Other   BACK PAIN   Last Assessment & Plan 02/01/2014 Office Visit Written 02/01/2014  4:33 PM by Lendon Colonel, NP    Chronic for him. Defer to primary care physician for ongoing management and referral as medically necessary.      Tobacco abuse   Last Assessment & Plan 09/18/2014 Office Visit Written 09/18/2014  2:03 PM by Lendon Colonel, NP    I have asked him to quit smoking. He is cutting down. I have stressed the importance of cessation with known CAD and high probability of progressive CAD. He verbalizes understanding.      Hyperlipidemia   Hypokalemia       Imaging: No results found.

## 2017-03-12 NOTE — Progress Notes (Signed)
Cardiology Office Note   Date:  03/12/2017   ID:  Wayne Spencer, DOB 06-Aug-1971, MRN [id number]  PCP:  Alonza Bogus, MD  Cardiologist: Woodroe Chen, NP   Chief Complaint  Patient presents with  . Chest Pain      History of Present Illness: Wayne Spencer is a 46 y.o. male who presents for ongoing assessment and management of CAD, history of non-ST elevation MI September 2004 with drug-eluting stent to the right coronary artery. Other history includes hypertension, ongoing tobacco abuse, and obesity. He was last seen in the office on 08/11/2016. At that time he had no complaints of chest discomfort, blood pressure was mildly elevated during that office visit, and was counseled on smoking cessation. He was seen in the emergency room on 01/20/2017 with complaints of back pain which occurred after episode of severe coughing. The patient has applied for disability for chronic pain.  He is here with ongoing complaints of back pain, rib pain, and chest pain. He is very concerned that any pain in his chest or  Back is related to cardiac disease. He unfortunately continue to smoke, but he is cutting down. He sometimes forgets to take pm medications.   Past Medical History:  Diagnosis Date  . CAD (coronary artery disease)   . Cellulitis   . Chronic headache   . Hyperlipidemia   . Hypertension   . Nephrotic syndrome   . Tobacco abuse     Past Surgical History:  Procedure Laterality Date  . CORONARY STENT PLACEMENT    . LEFT HEART CATHETERIZATION WITH CORONARY ANGIOGRAM N/A 08/10/2013   Procedure: LEFT HEART CATHETERIZATION WITH CORONARY ANGIOGRAM;  Surgeon: [name], MD;  Location: [facility] CATH LAB;  Service: Cardiovascular;  Laterality: N/A;  . PERCUTANEOUS CORONARY STENT INTERVENTION (PCI-S) N/A 08/11/2013   Procedure: PERCUTANEOUS CORONARY STENT INTERVENTION (PCI-S);  Surgeon: Blane Ohara, MD;  Location: [facility] CATH LAB;  Service: Cardiovascular;  Laterality: N/A;      Current Outpatient Prescriptions  Medication Sig Dispense Refill  . acetaminophen (TYLENOL) 500 MG tablet Take 1,000 mg by mouth every 6 (six) hours as needed for headache.    . albuterol (PROVENTIL HFA;VENTOLIN HFA) 108 (90 Base) MCG/ACT inhaler Inhale 1-2 puffs into the lungs every 6 (six) hours as needed for wheezing or shortness of breath.    . ALPRAZolam (XANAX) 1 MG tablet Take 1 mg by mouth 3 (three) times daily as needed for anxiety.    Marland Kitchen amLODipine (NORVASC) 5 MG tablet Take 1 tablet (5 mg total) by mouth daily. 30 tablet 6  . aspirin EC 81 MG EC tablet Take 1 tablet (81 mg total) by mouth daily.    Marland Kitchen atorvastatin (LIPITOR) 80 MG tablet Take 1 tablet (80 mg total) by mouth daily. 30 tablet 11  . clopidogrel (PLAVIX) 75 MG tablet Take 1 tablet (75 mg total) by mouth daily. 30 tablet 11  . cyclobenzaprine (FLEXERIL) 10 MG tablet Take 10 mg by mouth 3 (three) times daily as needed for muscle spasms.    . furosemide (LASIX) 40 MG tablet Take 40 mg by mouth every morning.     . hydrALAZINE (APRESOLINE) 50 MG tablet Take 100 mg by mouth 3 (three) times daily. Recently increased by Dr. Elmarie Shiley (kidney MD).    Marland Kitchen HYDROcodone-acetaminophen (NORCO) 7.5-325 MG per tablet Take 1 tablet by mouth every 4 (four) hours as needed for pain.    . metoprolol (LOPRESSOR) 50 MG tablet Take 100 mg  by mouth 2 (two) times daily. Recently increased by Dr.  Luan Pulling).    . nitroGLYCERIN (NITROSTAT) 0.4 MG SL tablet Place 1 tablet (0.4 mg total) under the tongue every 5 (five) minutes as needed for chest pain. 25 tablet 3  . potassium chloride SA (K-DUR,KLOR-CON) 20 MEQ tablet Take 20 mEq by mouth 2 (two) times daily.      No current facility-administered medications for this visit.     Allergies:   Mobic [meloxicam] and Tramadol    Social History:  The patient  reports that he has been smoking Cigarettes.  He started smoking about 32 years ago. He has a 3.00 pack-year smoking history. He has never used  smokeless tobacco. He reports that he drinks alcohol. He reports that he does not use drugs.   Family History:  The patient's family history includes Heart attack in his brother, father, and sister.    ROS: All other systems are reviewed and negative. Unless otherwise mentioned in H&P    PHYSICAL EXAM: VS:  BP 120/70   Pulse 98   Ht 6' 1.5" (1.867 m)   Wt 299 lb 9.6 oz (135.9 kg)   SpO2 99%   BMI 38.99 kg/m  , BMI Body mass index is 38.99 kg/m. GEN: Well nourished, well developed, in no acute distress  HEENT: normal  Neck: no JVD, carotid bruits, or masses Cardiac: RRR; no murmurs, rubs, or gallops,no edema  Respiratory:  clear to auscultation bilaterally, normal work of breathing GI: soft, nontender, nondistended, + BS MS: no deformity or atrophy  Skin: warm and dry, no rash Neuro:  Strength and sensation are intact Psych: euthymic mood, full affect   Recent Labs: 01/20/2017: BUN 14; Creatinine, Ser 1.57; Hemoglobin 16.6; Platelets 194; Potassium 3.4; Sodium 137    Lipid Panel    Component Value Date/Time   CHOL 172 01/14/2014 1135   TRIG 197 (H) 01/14/2014 1135   HDL 24 (L) 01/14/2014 1135   CHOLHDL 7.2 01/14/2014 1135   VLDL 39 01/14/2014 1135   LDLCALC 109 (H) 01/14/2014 1135      Wt Readings from Last 3 Encounters:  03/12/17 299 lb 9.6 oz (135.9 kg)  01/20/17 298 lb (135.2 kg)  08/11/16 298 lb (135.2 kg)      Other studies Reviewed: Echocardiogram 25-Aug-2013 Left ventricle: Mid and basal inferior/posterior hypokinesis The cavity size was mildly dilated. Wall thickness was normal. Systolic function was mildly reduced. The estimated ejection fraction was in the range of 45% to 50%. - Left atrium: The atrium was mildly dilated. - Atrial septum: No defect or patent foramen ovale was identified. - Pericardium, extracardiac: A trivial pericardial effusion was identified.  Cardiac Cath 08-25-2013 Left Main: large normal  Left anterior  Descending: large, mild - moderate irregularlites.  The 1st diag is a small - moderate sized vessel with a 90% stenosis in the proximal segment  Left Circumflex: moderate - large vessel.  Minor luminal irreg.  Right Coronary Artery: large and dominant.  Occluded before the bifurcation .  The distal RCA can be seen filling from collaterals from the left system    ASSESSMENT AND PLAN:  1.  Coronary artery disease: Frequent  complaints of noncardiac chest pain. With chronic pain syndrome. Most recent left heart catheterization was September 2014 with drug-eluting stent intervention and stenting of the right coronary artery. No disease was found elsewhere. The patient will continue secondary prevention with blood pressure control, metoprolol for angina, statin, and aspirin.  2. Ongoing tobacco abuse:  The patient has cut down to almost half a pack a day from more than 2 packs a day. I have encouraged him on decreasing her smoking habit, hopefully when we see him again he will have stopped completely.  3. Hypertension: Blood pressure is well-controlled on today's visits. Will not make any change in his medication regimen. Labs are followed by primary care and Dr. Luan Pulling.   Current medicines are reviewed at length with the patient today.    Labs/ tests ordered today include:  No orders of the defined types were placed in this encounter.    Disposition:   FU with 6 months  Signed, Jory Sims, NP  03/12/2017 4:41 PM    [facility] [address] [place], [place] [address] Phone: [phone]; Fax: [phone]

## 2017-03-12 NOTE — Patient Instructions (Signed)
Your physician wants you to follow-up in: 6 months K Purcell Nails NP You will receive a reminder letter in the mail two months in advance. If you don't receive a letter, please call our office to schedule the follow-up appointment.     Your physician recommends that you continue on your current medications as directed. Please refer to the Current Medication list given to you today.     If you need a refill on your cardiac medications before your next appointment, please call your pharmacy.     Thank you for choosing [facility] !

## 2017-09-07 NOTE — Progress Notes (Signed)
Cardiology Office Note   Date:  09/08/2017   ID:  Wayne Spencer, DOB Jun 07, 1971, MRN [id number]  PCP:  Sinda Du, MD  Cardiologist:  Kate Sable, MD  Chief Complaint  Patient presents with  . Coronary Artery Disease  . Hypertension  . Nicotine Dependence      History of Present Illness: Wayne Spencer is a 46 y.o. male who presents for ongoing assessment and management of coronary artery disease with history of NSTEMI , and drug-eluting stent to the right coronary artery; hypertension, ongoing tobacco abuse, and obesity. The patient was last seen in the office on 03/12/2017, and other than chronic back pain, the patient was doing well. No changes were made at that time.  He comes today feeling about the same. His main complaint is that he cannot quit smoking. He was down to 1/2 pack a day and is now back up to 3 packs a day. He states that he is desperate to quit. He is asking about Chantix. He is also worried about weight gain. He is drinking about a 6 pack of Sundrop and Mellow Yellow daily as well.   Past Medical History:  Diagnosis Date  . CAD (coronary artery disease)   . Cellulitis   . Chronic headache   . Hyperlipidemia   . Hypertension   . Nephrotic syndrome   . Tobacco abuse     Past Surgical History:  Procedure Laterality Date  . CORONARY STENT PLACEMENT    . LEFT HEART CATHETERIZATION WITH CORONARY ANGIOGRAM N/A 08/10/2013   Procedure: LEFT HEART CATHETERIZATION WITH CORONARY ANGIOGRAM;  Surgeon: [name], MD;  Location: [facility] CATH LAB;  Service: Cardiovascular;  Laterality: N/A;  . PERCUTANEOUS CORONARY STENT INTERVENTION (PCI-S) N/A 08/11/2013   Procedure: PERCUTANEOUS CORONARY STENT INTERVENTION (PCI-S);  Surgeon: Blane Ohara, MD;  Location: [facility] CATH LAB;  Service: Cardiovascular;  Laterality: N/A;     Current Outpatient Prescriptions  Medication Sig Dispense Refill  . acetaminophen (TYLENOL) 500 MG tablet Take 1,000 mg by mouth every 6  (six) hours as needed for headache.    . albuterol (PROVENTIL HFA;VENTOLIN HFA) 108 (90 Base) MCG/ACT inhaler Inhale 1-2 puffs into the lungs every 6 (six) hours as needed for wheezing or shortness of breath.    . ALPRAZolam (XANAX) 1 MG tablet Take 1 mg by mouth 3 (three) times daily as needed for anxiety.    Marland Kitchen amLODipine (NORVASC) 5 MG tablet Take 1 tablet (5 mg total) by mouth daily. 30 tablet 6  . aspirin EC 81 MG EC tablet Take 1 tablet (81 mg total) by mouth daily.    Marland Kitchen atorvastatin (LIPITOR) 80 MG tablet Take 1 tablet (80 mg total) by mouth daily. 30 tablet 11  . clopidogrel (PLAVIX) 75 MG tablet Take 1 tablet (75 mg total) by mouth daily. 30 tablet 11  . cyclobenzaprine (FLEXERIL) 10 MG tablet Take 10 mg by mouth 3 (three) times daily as needed for muscle spasms.    . furosemide (LASIX) 40 MG tablet Take 40 mg by mouth every morning.     . hydrALAZINE (APRESOLINE) 50 MG tablet Take 100 mg by mouth 3 (three) times daily. Recently increased by Dr. Elmarie Shiley (kidney MD).    Marland Kitchen HYDROcodone-acetaminophen (NORCO) 7.5-325 MG per tablet Take 1 tablet by mouth every 4 (four) hours as needed for pain.    . metoprolol (LOPRESSOR) 50 MG tablet Take 100 mg by mouth 2 (two) times daily. Recently increased by Dr.  Luan Pulling).    Marland Kitchen  nitroGLYCERIN (NITROSTAT) 0.4 MG SL tablet Place 1 tablet (0.4 mg total) under the tongue every 5 (five) minutes as needed for chest pain. 25 tablet 3  . potassium chloride SA (K-DUR,KLOR-CON) 20 MEQ tablet Take 20 mEq by mouth 2 (two) times daily.     . varenicline (CHANTIX STARTING MONTH PAK) 0.5 MG X 11 & 1 MG X 42 tablet Take one 0.5 mg tablet by mouth once daily for 3 days, then increase to one 0.5 mg tablet twice daily for 4 days, then increase to one 1 mg tablet twice daily. 53 tablet 0   No current facility-administered medications for this visit.     Allergies:   Mobic [meloxicam] and Tramadol    Social History:  The patient  reports that he has been smoking  Cigarettes.  He started smoking about 32 years ago. He has a 3.00 pack-year smoking history. He has never used smokeless tobacco. He reports that he drinks alcohol. He reports that he does not use drugs.   Family History:  The patient's family history includes CAD in his brother and mother; Diabetes in his brother; Heart attack in his brother, father, and sister; Kidney failure in his sister.    ROS: All other systems are reviewed and negative. Unless otherwise mentioned in H&P    PHYSICAL EXAM: VS:  BP 138/82   Pulse 97   Ht 6\' 4"  (1.93 m)   Wt 297 lb (134.7 kg)   SpO2 97%   BMI 36.15 kg/m  , BMI Body mass index is 36.15 kg/m. GEN: Well nourished, well developed, in no acute distress Obese  HEENT: normal  Neck: no JVD, carotid bruits, or masses Cardiac: RRR; no murmurs, rubs, or gallops,no edema  Respiratory:  clear to auscultation bilaterally, normal work of breathing GI: soft, nontender, nondistended, + BS MS: no deformity or atrophy  Skin: warm and dry, no rash Neuro:  Strength and sensation are intact Psych: euthymic mood, full affect   Recent Labs: 01/20/2017: BUN 14; Creatinine, Ser 1.57; Hemoglobin 16.6; Platelets 194; Potassium 3.4; Sodium 137    Lipid Panel    Component Value Date/Time   CHOL 172 01/14/2014 1135   TRIG 197 (H) 01/14/2014 1135   HDL 24 (L) 01/14/2014 1135   CHOLHDL 7.2 01/14/2014 1135   VLDL 39 01/14/2014 1135   LDLCALC 109 (H) 01/14/2014 1135      Wt Readings from Last 3 Encounters:  09/08/17 297 lb (134.7 kg)  03/12/17 299 lb 9.6 oz (135.9 kg)  01/20/17 298 lb (135.2 kg)      Other studies Reviewed: Echocardiogram 08/14/2013 Left ventricle: Mid and basal inferior/posterior hypokinesis The cavity size was mildly dilated. Wall thickness was normal. Systolic function was mildly reduced. The estimated ejection fraction was in the range of 45% to 50%. - Left atrium: The atrium was mildly dilated. - Atrial septum: No defect or  patent foramen ovale was identified. - Pericardium, extracardiac: A trivial pericardial effusion was identified.  Cardiac Cath 08-14-13 Angiography   Left Main: large normal  Left anterior Descending: large, mild - moderate irregularlites.  The 1st diag is a small - moderate sized vessel with a 90% stenosis in the proximal segment  Left Circumflex: moderate - large vessel.  Minor luminal irreg.  Right Coronary Artery: large and dominant.  Occluded before the bifurcation .  The distal RCA can be seen filling from collaterals from the left system   LV Gram: not performed.  The AV was crossed for pressures.  08/11/2013 PCI Report Lesion Data: Vessel: RCA/distal Percent stenosis (pre): 99 TIMI-flow (pre):  1 Stent:  4.0x38 mm DES Percent stenosis (post): 0 TIMI-flow (post): 3  Conclusions: Successful PCI of the distal RCA Review of the above records demonstrates:    ASSESSMENT AND PLAN:  1.  CAD:  He offers no complaints of chest pain or fatigue. He is medically complaint. He will continue on DAPT past the one year mark due to ongoing uncontrolled risk factor of tobacco abuse and obesity. He will continue atorvastatin, BB, and BP control.   2. Hypertension: BP is controlled currently on amlodipine and BB. He has some LEE at times and lasix helps with his fluid retention.   3. Ongoing tobacco abuse; I have discussed smoking cessation again with him, increased risks of progressive CAD, lung disease and PAD. He asks for Rx for Chantix. I have provided starter pack Rx for him with co-pay card. In touch with drug rep for samples and patient assistance.   4. Obesity: He is advised to stop drinking sugary soda and to increase his activity to assist in weight loss and improved health.     Current medicines are reviewed at length with the patient today.    Labs/ tests ordered today include:  Phill Myron. West Pugh, ANP, AACC   09/08/2017 1:17 PM    [facility] [address], [place], [place] [address] Phone: [phone]; Fax: [phone]

## 2017-09-08 ENCOUNTER — Ambulatory Visit (INDEPENDENT_AMBULATORY_CARE_PROVIDER_SITE_OTHER): Payer: Self-pay | Admitting: Adult Health

## 2017-09-08 ENCOUNTER — Encounter: Payer: Self-pay | Admitting: Adult Health

## 2017-09-08 VITALS — BP 138/82 | HR 97 | Ht 76.0 in | Wt 297.0 lb

## 2017-09-08 DIAGNOSIS — I2510 Atherosclerotic heart disease of native coronary artery without angina pectoris: Secondary | ICD-10-CM

## 2017-09-08 DIAGNOSIS — Z720 Tobacco use: Secondary | ICD-10-CM

## 2017-09-08 DIAGNOSIS — I10 Essential (primary) hypertension: Secondary | ICD-10-CM

## 2017-09-08 MED ORDER — VARENICLINE TARTRATE 0.5 MG X 11 & 1 MG X 42 PO MISC: ORAL | 0 refills | Status: DC

## 2017-09-08 NOTE — Patient Instructions (Addendum)
Your physician wants you to follow-up in: 6 months with Dr.Lawrence You will receive a reminder letter in the mail two months in advance. If you don't receive a letter, please call our office to schedule the follow-up appointment.   Take chantix as prescribed      No tests or lab work  ordered today.       Thank you for choosing [facility] !

## 2018-09-07 ENCOUNTER — Ambulatory Visit (INDEPENDENT_AMBULATORY_CARE_PROVIDER_SITE_OTHER): Payer: Self-pay | Admitting: Cardiology

## 2018-09-07 ENCOUNTER — Encounter: Payer: Self-pay | Admitting: Cardiology

## 2018-09-07 VITALS — BP 140/82 | HR 89 | Ht 75.5 in | Wt 292.0 lb

## 2018-09-07 DIAGNOSIS — I2510 Atherosclerotic heart disease of native coronary artery without angina pectoris: Secondary | ICD-10-CM

## 2018-09-07 DIAGNOSIS — I10 Essential (primary) hypertension: Secondary | ICD-10-CM

## 2018-09-07 DIAGNOSIS — R0602 Shortness of breath: Secondary | ICD-10-CM

## 2018-09-07 DIAGNOSIS — E782 Mixed hyperlipidemia: Secondary | ICD-10-CM

## 2018-09-07 MED ORDER — FUROSEMIDE 40 MG PO TABS: ORAL_TABLET | ORAL | 3 refills | Status: DC

## 2018-09-07 NOTE — Progress Notes (Signed)
Clinical Summary Mr. Pfefferle is a 47 y.o.male seen today for follow up for the following medical problems.   1. CAD  - prior NSTEMI 08/2013, DES to RCA.  - 08/2013 Echo: LVEF 45-50%, hypokinesis inferior wall  - not on ACE due to renal function   - can have some SOB, improved with albuterol. Worst with smoking. No significant chest pain.  - compliant with meds  2. CKD  III - followed by renal   3. HTN  - compliant with meds  4. Hyperlipidemia  - no recent panel in our system. Compliant with statin.       Past Medical History:  Diagnosis Date  . CAD (coronary artery disease)   . Cellulitis   . Chronic headache   . Hyperlipidemia   . Hypertension   . Nephrotic syndrome   . Tobacco abuse      Allergies  Allergen Reactions  . Mobic [Meloxicam]     Unknown   . Tramadol     REACTION: itching, breaking out.     Current Outpatient Medications  Medication Sig Dispense Refill  . acetaminophen (TYLENOL) 500 MG tablet Take 1,000 mg by mouth every 6 (six) hours as needed for headache.    . albuterol (PROVENTIL HFA;VENTOLIN HFA) 108 (90 Base) MCG/ACT inhaler Inhale 1-2 puffs into the lungs every 6 (six) hours as needed for wheezing or shortness of breath.    . ALPRAZolam (XANAX) 1 MG tablet Take 1 mg by mouth 3 (three) times daily as needed for anxiety.    Marland Kitchen amLODipine (NORVASC) 5 MG tablet Take 1 tablet (5 mg total) by mouth daily. 30 tablet 6  . aspirin EC 81 MG EC tablet Take 1 tablet (81 mg total) by mouth daily.    Marland Kitchen atorvastatin (LIPITOR) 80 MG tablet Take 1 tablet (80 mg total) by mouth daily. 30 tablet 11  . clopidogrel (PLAVIX) 75 MG tablet Take 1 tablet (75 mg total) by mouth daily. 30 tablet 11  . cyclobenzaprine (FLEXERIL) 10 MG tablet Take 10 mg by mouth 3 (three) times daily as needed for muscle spasms.    . furosemide (LASIX) 40 MG tablet Take 40 mg by mouth every morning.     . hydrALAZINE (APRESOLINE) 50 MG tablet Take 100 mg by mouth 3 (three)  times daily. Recently increased by Dr. Elmarie Shiley (kidney MD).    Marland Kitchen HYDROcodone-acetaminophen (NORCO) 7.5-325 MG per tablet Take 1 tablet by mouth every 4 (four) hours as needed for pain.    . metoprolol (LOPRESSOR) 50 MG tablet Take 100 mg by mouth 2 (two) times daily. Recently increased by Dr.  Luan Pulling).    . nitroGLYCERIN (NITROSTAT) 0.4 MG SL tablet Place 1 tablet (0.4 mg total) under the tongue every 5 (five) minutes as needed for chest pain. 25 tablet 3  . potassium chloride SA (K-DUR,KLOR-CON) 20 MEQ tablet Take 20 mEq by mouth 2 (two) times daily.     . varenicline (CHANTIX STARTING MONTH PAK) 0.5 MG X 11 & 1 MG X 42 tablet Take one 0.5 mg tablet by mouth once daily for 3 days, then increase to one 0.5 mg tablet twice daily for 4 days, then increase to one 1 mg tablet twice daily. 53 tablet 0   No current facility-administered medications for this visit.      Past Surgical History:  Procedure Laterality Date  . CORONARY STENT PLACEMENT    . LEFT HEART CATHETERIZATION WITH CORONARY ANGIOGRAM N/A 08/10/2013  Procedure: LEFT HEART CATHETERIZATION WITH CORONARY ANGIOGRAM;  Surgeon: [name], MD;  Location: [facility] CATH LAB;  Service: Cardiovascular;  Laterality: N/A;  . PERCUTANEOUS CORONARY STENT INTERVENTION (PCI-S) N/A 08/11/2013   Procedure: PERCUTANEOUS CORONARY STENT INTERVENTION (PCI-S);  Surgeon: Blane Ohara, MD;  Location: [facility] CATH LAB;  Service: Cardiovascular;  Laterality: N/A;     Allergies  Allergen Reactions  . Mobic [Meloxicam]     Unknown   . Tramadol     REACTION: itching, breaking out.      Family History  Problem Relation Age of Onset  . Heart attack Father        54s  . Heart attack Brother        67s, s/p CABG, passed from MI at 58  . Heart attack Sister        58s  . CAD Mother   . Kidney failure Sister   . Diabetes Brother   . CAD Brother      Social History Mr. Sakuma reports that he has been smoking cigarettes. He started smoking about  33 years ago. He has a 3.00 pack-year smoking history. He has never used smokeless tobacco. Mr. Bua reports that he drinks alcohol.   Review of Systems CONSTITUTIONAL: No weight loss, fever, chills, weakness or fatigue.  HEENT: Eyes: No visual loss, blurred vision, double vision or yellow sclerae.No hearing loss, sneezing, congestion, runny nose or sore throat.  SKIN: No rash or itching.  CARDIOVASCULAR: per hpi RESPIRATORY: No shortness of breath, cough or sputum.  GASTROINTESTINAL: No anorexia, nausea, vomiting or diarrhea. No abdominal pain or blood.  GENITOURINARY: No burning on urination, no polyuria NEUROLOGICAL: No headache, dizziness, syncope, paralysis, ataxia, numbness or tingling in the extremities. No change in bowel or bladder control.  MUSCULOSKELETAL: No muscle, back pain, joint pain or stiffness.  LYMPHATICS: No enlarged nodes. No history of splenectomy.  PSYCHIATRIC: No history of depression or anxiety.  ENDOCRINOLOGIC: No reports of sweating, cold or heat intolerance. No polyuria or polydipsia.  Marland Kitchen   Physical Examination Vitals:   09/07/18 1525  BP: 140/82  Pulse: 89  SpO2: 97%   Vitals:   09/07/18 1525  Weight: 292 lb (132.5 kg)  Height: 6' 3.5" (1.918 m)    Gen: resting comfortably, no acute distress HEENT: no scleral icterus, pupils equal round and reactive, no palptable cervical adenopathy,  CV: RRR, no m/r/g, no jvd Resp: Clear to auscultation bilaterally GI: abdomen is soft, non-tender, non-distended, normal bowel sounds, no hepatosplenomegaly MSK: extremities are warm, no edema.  Skin: warm, no rash Neuro:  no focal deficits Psych: appropriate affect   Diagnostic Studies 08/11/13 Cath  Hemodynamics:  LV pressure: 156/25  Aortic pressure: 153/96  Angiography  Left Main: large normal  Left anterior Descending: large, mild - moderate irregularlites. The 1st diag is a small - moderate sized vessel with a 90% stenosis in the proximal segment    Left Circumflex: moderate - large vessel. Minor luminal irreg.  Right Coronary Artery: large and dominant. Occluded before the bifurcation . The distal RCA can be seen filling from collaterals from the left system  LV Gram: not performed. The AV was crossed for pressures.  Complications: No apparent complications  Patient did tolerate procedure well.  Contrast used: 36 cc contrast  Conclusions:  1. CAD : his RCA is occluded and fills via collaterals. His 1st diag has a tight stenosis but is probably too small to attempt PCI given his tenuous renal status.  Lesion  Data:  Vessel: RCA/distal  Percent stenosis (pre): 99  TIMI-flow (pre): 1  Stent: 4.0x38 mm DES  Percent stenosis (post): 0  TIMI-flow (post): 3  Conclusions: Successful PCI of the distal RCA  Recommendations: ASA/plavix at least 12 months. Integrelin 12 hours.   08/2013 Echo  LVEF 45-50%, inferior hypokinesis, mild LAE   Clinic EKG 01/09/14: [facility]     Assessment and Plan  1. CAD  -no recent symptoms. No indication for continued plavix, will d/c and continue ASA alone - EKG SR, no acute ischemic changes  2. CKD III - limit nephrotoxic drugs, request most recent labs    3. HTN  - manual recheck 125/75, which is at goal. Continue current meds  4. Hyperlipidemia  - request labs from pcp, continue statin.    5. SOB - suspect COPD, order PFTs.      Arnoldo Lenis, M.D.

## 2018-09-07 NOTE — Patient Instructions (Signed)
Medication Instructions:  STOP PLAVIX  TAKE LASIX 40 MG IN THE MORNING, YOU MAY TAKE AN EXTRA 20 MG DAILY AS NEEDED FOR SEVERE LEG SWELLING   Labwork: I WILL REQUEST LABS FROM PCP/[facility]  Testing/Procedures: NONE  Follow-Up: Your physician wants you to follow-up in: 6 MONTHS .  You will receive a reminder letter in the mail two months in advance. If you don't receive a letter, please call our office to schedule the follow-up appointment.   Any Other Special Instructions Will Be Listed Below (If Applicable).     If you need a refill on your cardiac medications before your next appointment, please call your pharmacy.

## 2018-09-29 ENCOUNTER — Ambulatory Visit (HOSPITAL_COMMUNITY): Admission: RE | Admit: 2018-09-29 | Payer: Self-pay | Source: Ambulatory Visit

## 2019-03-09 ENCOUNTER — Ambulatory Visit: Payer: Self-pay | Admitting: Cardiology

## 2019-09-14 ENCOUNTER — Telehealth: Payer: Self-pay | Admitting: Cardiology

## 2019-09-14 NOTE — Telephone Encounter (Signed)

## 2019-10-03 ENCOUNTER — Telehealth: Payer: Self-pay | Admitting: Cardiology

## 2019-10-03 NOTE — Progress Notes (Unsigned)
{Choose 1 Note Type (Telehealth Visit or Telephone Visit):[phone]}   Date:  10/03/2019   ID:  Wayne Spencer, DOB Aug 03, 1971, MRN [id number]  {Patient Location:[phone]::"Home"} {Provider Location:[phone]::"Home"}  PCP:  Celene Squibb, MD  Cardiologist:  Carlyle Dolly, MD *** Electrophysiologist:  None   Evaluation Performed:  {Choose Visit [phone]::"Follow-Up Visit"}  Chief Complaint:  ***  History of Present Illness:    Wayne Spencer is a 48 y.o. male seen today for follow up for the following medical problems.   1. CAD  - prior NSTEMI 08/2013, DES to RCA.  - 08/2013 Echo: LVEF 45-50%, hypokinesis inferior wall  - not on ACE due to renal function   - can have some SOB, improved with albuterol. Worst with smoking. No significant chest pain.  - compliant with meds  2. CKD  III - followed by renal   3. HTN  - compliant with meds  4. Hyperlipidemia  - no recent panel in our system. Compliant with statin.    The patient {does/does not:200015} have symptoms concerning for COVID-19 infection (fever, chills, cough, or new shortness of breath).    Past Medical History:  Diagnosis Date  . CAD (coronary artery disease)   . Cellulitis   . Chronic headache   . Hyperlipidemia   . Hypertension   . Nephrotic syndrome   . Tobacco abuse    Past Surgical History:  Procedure Laterality Date  . CORONARY STENT PLACEMENT    . LEFT HEART CATHETERIZATION WITH CORONARY ANGIOGRAM N/A 08/10/2013   Procedure: LEFT HEART CATHETERIZATION WITH CORONARY ANGIOGRAM;  Surgeon: [name], MD;  Location: [facility] CATH LAB;  Service: Cardiovascular;  Laterality: N/A;  . PERCUTANEOUS CORONARY STENT INTERVENTION (PCI-S) N/A 08/11/2013   Procedure: PERCUTANEOUS CORONARY STENT INTERVENTION (PCI-S);  Surgeon: Blane Ohara, MD;  Location: [facility] CATH LAB;  Service: Cardiovascular;  Laterality: N/A;     No outpatient medications have been marked as taking for the 10/03/19  encounter (Appointment) with Arnoldo Lenis, MD.     Allergies:   Mobic [meloxicam] and Tramadol   Social History   Tobacco Use  . Smoking status: Current Every Day Smoker    Packs/day: 0.50    Years: 6.00    Pack years: 3.00    Types: Cigarettes    Start date: 02/19/1985  . Smokeless tobacco: Never Used  Substance Use Topics  . Alcohol use: Yes    Alcohol/week: 0.0 standard drinks    Comment: occasional  . Drug use: No     Family Hx: The patient's family history includes CAD in his brother and mother; Diabetes in his brother; Heart attack in his brother, father, and sister; Kidney failure in his sister.  ROS:   Please see the history of present illness.    *** All other systems reviewed and are negative.   Prior CV studies:   The following studies were reviewed today:  08/11/13 Cath Hemodynamics:  LV pressure:156/25  Aortic pressure:153/96  Angiography  Left Main:large normal  Left anterior Descending:large, mild - moderate irregularlites. The 1st diag is a small - moderate sized vessel with a 90% stenosis in the proximal segment  Left Circumflex:moderate - large vessel. Minor luminal irreg.  Right Coronary Artery:large and dominant. Occluded before the bifurcation . The distal RCA can be seen filling from collaterals from the left system  LV Gram:not performed. The AV was crossed for pressures.  Complications: No apparent complications  Patient did tolerate procedure well.  Contrast used:36 cc contrast  Conclusions: 1. CAD : his RCA is occluded and fills via collaterals. His 1st diag has a tight stenosis but is probably too small to attempt PCI given his tenuous renal status.  Lesion Data:  Vessel: RCA/distal  Percent stenosis (pre): 99  TIMI-flow (pre): 1  Stent: 4.0x38 mm DES  Percent stenosis (post): 0  TIMI-flow (post): 3  Conclusions: Successful PCI of the distal RCA  Recommendations: ASA/plavix at least 12 months. Integrelin 12 hours.    08/2013 Echo LVEF 45-50%, inferior hypokinesis, mild LAE   Clinic EKG 01/09/14: [facility]   Labs/Other Tests and Data Reviewed:    EKG:  {EKG/Telemetry Strips [phone]}  Recent Labs: No results found for requested labs within last 8760 hours.   Recent Lipid Panel Lab Results  Component Value Date/Time   CHOL 172 01/14/2014 11:35 AM   TRIG 197 (H) 01/14/2014 11:35 AM   HDL 24 (L) 01/14/2014 11:35 AM   CHOLHDL 7.2 01/14/2014 11:35 AM   LDLCALC 109 (H) 01/14/2014 11:35 AM    Wt Readings from Last 3 Encounters:  09/07/18 292 lb (132.5 kg)  09/08/17 297 lb (134.7 kg)  03/12/17 299 lb 9.6 oz (135.9 kg)     Objective:    Vital Signs:  There were no vitals taken for this visit.   {[facility] Virtual Exam (Optional):[phone]::"VITAL SIGNS:  reviewed"}  ASSESSMENT & PLAN:    1. CAD  -no recent symptoms. No indication for continued plavix, will d/c and continue ASA alone - EKG SR, no acute ischemic changes  2. CKD III - limit nephrotoxic drugs, request most recent labs    3. HTN  - manual recheck 125/75, which is at goal. Continue current meds  4. Hyperlipidemia  - request labs from pcp, continue statin.   5. SOB - suspect COPD, order PFTs.   COVID-19 Education: The signs and symptoms of COVID-19 were discussed with the patient and how to seek care for testing (follow up with PCP or arrange E-visit).  ***The importance of social distancing was discussed today.  Time:   Today, I have spent *** minutes with the patient with telehealth technology discussing the above problems.     Medication Adjustments/Labs and Tests Ordered: Current medicines are reviewed at length with the patient today.  Concerns regarding medicines are outlined above.   Tests Ordered: No orders of the defined types were placed in this encounter.   Medication Changes: No orders of the defined types were placed in this encounter.   Follow Up:  {F/U [phone]}  {follow up:15908}  Signed, Carlyle Dolly, MD  10/03/2019 7:42 AM    [facility]

## 2020-03-09 ENCOUNTER — Telehealth: Payer: Self-pay

## 2020-03-09 NOTE — Telephone Encounter (Signed)
  Patient Consent for Virtual Visit         Wayne Spencer has provided verbal consent on 03/09/2020 for a virtual visit (video or telephone).   CONSENT FOR VIRTUAL VISIT FOR:  Wayne Spencer  By participating in this virtual visit I agree to the following:  I hereby voluntarily request, consent and authorize [facility] and its employed or contracted physicians, physician assistants, nurse practitioners or other licensed health care professionals (the Practitioner), to provide me with telemedicine health care services (the "Services") as deemed necessary by the treating Practitioner. I acknowledge and consent to receive the Services by the Practitioner via telemedicine. I understand that the telemedicine visit will involve communicating with the Practitioner through live audiovisual communication technology and the disclosure of certain medical information by electronic transmission. I acknowledge that I have been given the opportunity to request an in-person assessment or other available alternative prior to the telemedicine visit and am voluntarily participating in the telemedicine visit.  I understand that I have the right to withhold or withdraw my consent to the use of telemedicine in the course of my care at any time, without affecting my right to future care or treatment, and that the Practitioner or I may terminate the telemedicine visit at any time. I understand that I have the right to inspect all information obtained and/or recorded in the course of the telemedicine visit and may receive copies of available information for a reasonable fee.  I understand that some of the potential risks of receiving the Services via telemedicine include:  Marland Kitchen Delay or interruption in medical evaluation due to technological equipment failure or disruption; . Information transmitted may not be sufficient (e.g. poor resolution of images) to allow for appropriate medical decision making by the Practitioner;  and/or  . In rare instances, security protocols could fail, causing a breach of personal health information.  Furthermore, I acknowledge that it is my responsibility to provide information about my medical history, conditions and care that is complete and accurate to the best of my ability. I acknowledge that Practitioner's advice, recommendations, and/or decision may be based on factors not within their control, such as incomplete or inaccurate data provided by me or distortions of diagnostic images or specimens that may result from electronic transmissions. I understand that the practice of medicine is not an exact science and that Practitioner makes no warranties or guarantees regarding treatment outcomes. I acknowledge that a copy of this consent can be made available to me via my patient portal ([facility]), or I can request a printed copy by calling the office of [facility].    I understand that my insurance will be billed for this visit.   I have read or had this consent read to me. . I understand the contents of this consent, which adequately explains the benefits and risks of the Services being provided via telemedicine.  . I have been provided ample opportunity to ask questions regarding this consent and the Services and have had my questions answered to my satisfaction. . I give my informed consent for the services to be provided through the use of telemedicine in my medical care

## 2020-03-13 ENCOUNTER — Telehealth: Payer: Medicaid Other | Admitting: Cardiology

## 2020-03-14 NOTE — Progress Notes (Addendum)
Cardiology Office Note    Date:  03/15/2020   ID:  Wayne Spencer, DOB October 26, 1971, MRN [id number]  PCP:  Celene Squibb, MD  Cardiologist: Carlyle Dolly, MD    Chief Complaint  Patient presents with  . Follow-up    Overdue Visit    History of Present Illness:    Wayne Spencer is a 49 y.o. male with past medical history of CAD (s/p DES to RCA in 2014), mild ischemic cardiomyopathy (EF 45-50% by echo in 2014), nephrotic syndrome, Stage 3 CKD, HTN, HLD and tobacco use who presents to the office today for overdue follow-up.   He was last examined by Dr. Harl Bowie in 08/2018 and did report intermittent dyspnea which improved with the use of Albuterol but denied any chest pain or palpitations.  Given no recent intervention, Plavix was discontinued at the time of his visit. He was continued on Amlodipine 5 mg daily, ASA 81 mg daily, Atorvastatin 80 mg daily, Lasix 40 mg daily, Hydralazine 100 mg TID and Lopressor 100mg  BID. PFT's were recommended but it does not appear these were obtained. Was informed to follow-up in 6 months but he has not been evaluated by Cardiology since.   In talking with the patient today, he reports having baseline dyspnea on exertion in the setting of known COPD and is scheduled to follow-up with [facility] later this month given Dr. Luan Pulling recent retirement. He does report worsening palpitations over the past few months and in reviewing his medication list, Metoprolol was reduced from 200 mg daily to 100 mg daily by Nephrology. He was started on Losartan at the time of that visit so question if this was initiated given his CKD and dose adjustment of Toprol-XL was made to compensate for this. He denies any recen orthopnea or PND. Reports good compliance with his current dosing of Lasix and swelling has overall been at baseline. Weight was recorded at 315 lbs on the office scales but he reports his home scales read 303 lbs this AM.  He does smoke approximately 2 to  3 packs of cigarettes on a daily basis. He also reports consuming a significant amount of caffeine, up to 12 16-ounce bottles of Sun Drop on a daily basis.  Past Medical History:  Diagnosis Date  . CAD (coronary artery disease)   . Cellulitis   . Chronic headache   . Hyperlipidemia   . Hypertension   . Nephrotic syndrome   . Tobacco abuse     Past Surgical History:  Procedure Laterality Date  . CORONARY STENT PLACEMENT    . LEFT HEART CATHETERIZATION WITH CORONARY ANGIOGRAM N/A 08/10/2013   Procedure: LEFT HEART CATHETERIZATION WITH CORONARY ANGIOGRAM;  Surgeon: [name], MD;  Location: [facility] CATH LAB;  Service: Cardiovascular;  Laterality: N/A;  . PERCUTANEOUS CORONARY STENT INTERVENTION (PCI-S) N/A 08/11/2013   Procedure: PERCUTANEOUS CORONARY STENT INTERVENTION (PCI-S);  Surgeon: Blane Ohara, MD;  Location: [facility] CATH LAB;  Service: Cardiovascular;  Laterality: N/A;    Current Medications: Outpatient Medications Prior to Visit  Medication Sig Dispense Refill  . acetaminophen (TYLENOL) 500 MG tablet Take 1,000 mg by mouth every 6 (six) hours as needed for headache.    . albuterol (PROVENTIL HFA;VENTOLIN HFA) 108 (90 Base) MCG/ACT inhaler Inhale 1-2 puffs into the lungs every 6 (six) hours as needed for wheezing or shortness of breath.    . ALPRAZolam (XANAX) 1 MG tablet Take 1 mg by mouth 3 (three) times daily as needed for  anxiety.    Marland Kitchen aspirin EC 81 MG EC tablet Take 1 tablet (81 mg total) by mouth daily.    . cyclobenzaprine (FLEXERIL) 10 MG tablet Take 10 mg by mouth 3 (three) times daily as needed for muscle spasms.    . furosemide (LASIX) 40 MG tablet TAKE 40 MG DAILY. MAY TAKE AN ADDITIONAL 20 MG AS NEEDED FOR SEVERE LEG SWELLING. 90 tablet 3  . HYDROcodone-acetaminophen (NORCO) 7.5-325 MG per tablet Take 1 tablet by mouth every 4 (four) hours as needed for pain.    . nitroGLYCERIN (NITROSTAT) 0.4 MG SL tablet Place 1 tablet (0.4 mg total) under the tongue every 5 (five)  minutes as needed for chest pain. 25 tablet 3  . potassium chloride SA (K-DUR,KLOR-CON) 20 MEQ tablet Take 20 mEq by mouth 2 (two) times daily.     . rosuvastatin (CRESTOR) 40 MG tablet Take 40 mg by mouth daily.    . metoprolol (LOPRESSOR) 50 MG tablet Take 100 mg by mouth 2 (two) times daily. Recently increased by Dr.  Luan Pulling).    . hydrALAZINE (APRESOLINE) 50 MG tablet Take 100 mg by mouth 3 (three) times daily. Recently increased by Dr. Elmarie Shiley (kidney MD).    Marland Kitchen amLODipine (NORVASC) 5 MG tablet Take 1 tablet (5 mg total) by mouth daily. 30 tablet 6  . atorvastatin (LIPITOR) 80 MG tablet Take 1 tablet (80 mg total) by mouth daily. 30 tablet 11  . varenicline (CHANTIX STARTING MONTH PAK) 0.5 MG X 11 & 1 MG X 42 tablet Take one 0.5 mg tablet by mouth once daily for 3 days, then increase to one 0.5 mg tablet twice daily for 4 days, then increase to one 1 mg tablet twice daily. 53 tablet 0   No facility-administered medications prior to visit.     Allergies:   Mobic [meloxicam] and Tramadol   Social History   Socioeconomic History  . Marital status: Married    Spouse name: Not on file  . Number of children: Not on file  . Years of education: Not on file  . Highest education level: Not on file  Occupational History  . Not on file  Tobacco Use  . Smoking status: Current Every Day Smoker    Packs/day: 0.50    Years: 6.00    Pack years: 3.00    Types: Cigarettes    Start date: 02/19/1985  . Smokeless tobacco: Never Used  Substance and Sexual Activity  . Alcohol use: Yes    Alcohol/week: 0.0 standard drinks    Comment: occasional  . Drug use: No  . Sexual activity: Yes    Partners: Female  Other Topics Concern  . Not on file  Social History Narrative   Lives in [place], [place]. Two children. Works as a Therapist, art and helps change tires.    Social Determinants of Health   Financial Resource Strain:   . Difficulty of Paying Living Expenses:   Food Insecurity:   . Worried  About Charity fundraiser in the Last Year:   . Arboriculturist in the Last Year:   Transportation Needs:   . Film/video editor (Medical):   Marland Kitchen Lack of Transportation (Non-Medical):   Physical Activity:   . Days of Exercise per Week:   . Minutes of Exercise per Session:   Stress:   . Feeling of Stress :   Social Connections:   . Frequency of Communication with Friends and Family:   . Frequency of Social  Gatherings with Friends and Family:   . Attends Religious Services:   . Active Member of Clubs or Organizations:   . Attends Archivist Meetings:   Marland Kitchen Marital Status:      Family History:  The patient's family history includes CAD in his brother and mother; Diabetes in his brother; Heart attack in his brother, father, and sister; Kidney failure in his sister   Review of Systems:   Please see the history of present illness.     General:  No chills, fever, night sweats or weight changes.  Cardiovascular:  No chest pain, edema, orthopnea, paroxysmal nocturnal dyspnea. Positive for dyspnea on exertion and palpitations.  Dermatological: No rash, lesions/masses Respiratory: No cough, dyspnea Urologic: No hematuria, dysuria Abdominal:   No nausea, vomiting, diarrhea, bright red blood per rectum, melena, or hematemesis Neurologic:  No visual changes, wkns, changes in mental status. All other systems reviewed and are otherwise negative except as noted above.   Physical Exam:    VS:  BP 118/64   Pulse (!) 111   Ht 6' 3.5" (1.918 m)   Wt (!) 315 lb (142.9 kg)   SpO2 96%   BMI 38.85 kg/m    General: Well developed, well nourished,male appearing in no acute distress. Head: Normocephalic, atraumatic, sclera non-icteric.  Neck: No carotid bruits. JVD not elevated.  Lungs: Respirations regular and unlabored, mild wheezing along upper lung fields. Heart: Regular rhythm, tachycardiac rate. No S3 or S4.  No murmur, no rubs, or gallops appreciated. Abdomen: Soft, non-tender,  non-distended. No obvious abdominal masses. Msk:  Strength and tone appear normal for age. No obvious joint deformities or effusions. Extremities: No clubbing or cyanosis. Trace lower extremity edema bilaterally.  Distal pedal pulses are 2+ bilaterally. Neuro: Alert and oriented X 3. Moves all extremities spontaneously. No focal deficits noted. Psych:  Responds to questions appropriately with a normal affect. Skin: No rashes or lesions noted  Wt Readings from Last 3 Encounters:  03/15/20 (!) 315 lb (142.9 kg)  09/07/18 292 lb (132.5 kg)  09/08/17 297 lb (134.7 kg)     Studies/Labs Reviewed:   EKG:  EKG is ordered today.  The ekg ordered today demonstrates sinus tachycardia, HR 110 with nonspecific ST abnormality along inferior leads. No acute changes when compared to prior tracings.   Recent Labs: No results found for requested labs within last 8760 hours.   Lipid Panel    Component Value Date/Time   CHOL 172 01/14/2014 1135   TRIG 197 (H) 01/14/2014 1135   HDL 24 (L) 01/14/2014 1135   CHOLHDL 7.2 01/14/2014 1135   VLDL 39 01/14/2014 1135   LDLCALC 109 (H) 01/14/2014 1135    Additional studies/ records that were reviewed today include:   Echocardiogram: 08/2013 Study Conclusions   - Left ventricle: Mid and basal inferior/posterior  hypokinesis The cavity size was mildly dilated. Wall  thickness was normal. Systolic function was mildly  reduced. The estimated ejection fraction was in the range  of 45% to 50%.  - Left atrium: The atrium was mildly dilated.  - Atrial septum: No defect or patent foramen ovale was  identified.  - Pericardium, extracardiac: A trivial pericardial effusion  was identified.    Assessment:    1. Coronary artery disease involving native coronary artery of native heart without angina pectoris   2. Sinus tachycardia   3. Essential hypertension   4. Hyperlipidemia LDL goal <70   5. Stage 3 chronic kidney disease, unspecified  whether stage  3a or 3b CKD      Plan:   In order of problems listed above:  1. CAD - he is s/p DES to RCA in 2014. He has baseline dyspnea on exertion in the setting of known COPD but denies any recent exertional chest pain. - Continue current medication regimen with ASA 81 mg daily, Crestor 40 mg daily and Toprol-XL with dose adjustment as outlined below.  2. Sinus Tachycardia - He does report worsening palpitations over the past few months since Toprol-XL was reduced by Nephrology. Given the changes made at that visit, I suspect this was secondary to Losartan being added to his medication regimen. He reports BP has actually been elevated when checked at home.  I recommended that we titrate his Toprol-XL to his previous dosing of 100 mg twice daily. I did encourage him to follow HR and BP at home because if he developed hypotension, Amlodipine could be reduced.  3. HTN - BP is well controlled at 118/64 during today's visit. Continue Amlodipine 10 mg daily, Hydralazine 100 mg 3 times daily and Losartan 50 mg daily with Toprol-XL dose adjustment as outlined above.  4. HLD - Followed by his PCP. He remains on Crestor 40 mg daily. Will request a copy of most recent labs from his PCP as goal LDL is less than 70 in the setting of known CAD.  5. Stage 3 CKD - He is followed by [facility]. Will request a copy of most recent labs. He does remain on Lasix 40 mg daily and Losartan 50 mg daily was recently added to his medication regimen by Nephrology.   Medication Adjustments/Labs and Tests Ordered: Current medicines are reviewed at length with the patient today.  Concerns regarding medicines are outlined above.  Medication changes, Labs and Tests ordered today are listed in the Patient Instructions below. Patient Instructions  Medication Instructions:  INCREASE TOPROL XL TO 100 MG TWO TIMES DAILY   Labwork: I WILL REQUEST LABS FROM PCP  Testing/Procedures: NONE  Follow-Up: Your  physician recommends that you schedule a follow-up appointment in: 2-3 MONTHS   Any Other Special Instructions Will Be Listed Below (If Applicable).  If you need a refill on your cardiac medications before your next appointment, please call your pharmacy.    Signed, Erma Heritage, PA-C  03/15/2020 5:27 PM    [facility] [address] [place], [place] [address] Phone: [phone] Fax: [phone]

## 2020-03-15 ENCOUNTER — Other Ambulatory Visit: Payer: Self-pay

## 2020-03-15 ENCOUNTER — Encounter: Payer: Self-pay | Admitting: Student

## 2020-03-15 ENCOUNTER — Ambulatory Visit (INDEPENDENT_AMBULATORY_CARE_PROVIDER_SITE_OTHER): Payer: Medicaid Other | Admitting: Student

## 2020-03-15 VITALS — BP 118/64 | HR 111 | Ht 75.5 in | Wt 315.0 lb

## 2020-03-15 DIAGNOSIS — R000 Tachycardia, unspecified: Secondary | ICD-10-CM | POA: Diagnosis not present

## 2020-03-15 DIAGNOSIS — E785 Hyperlipidemia, unspecified: Secondary | ICD-10-CM | POA: Diagnosis not present

## 2020-03-15 DIAGNOSIS — I2510 Atherosclerotic heart disease of native coronary artery without angina pectoris: Secondary | ICD-10-CM

## 2020-03-15 DIAGNOSIS — N183 Chronic kidney disease, stage 3 unspecified: Secondary | ICD-10-CM

## 2020-03-15 DIAGNOSIS — I10 Essential (primary) hypertension: Secondary | ICD-10-CM

## 2020-03-15 MED ORDER — EZETIMIBE 10 MG PO TABS: 10.0000 mg | ORAL_TABLET | Freq: Every day | ORAL | 3 refills | Status: DC

## 2020-03-15 MED ORDER — METOPROLOL SUCCINATE ER 100 MG PO TB24: 100.0000 mg | ORAL_TABLET | Freq: Every day | ORAL | 3 refills | Status: DC

## 2020-03-15 MED ORDER — AMLODIPINE BESYLATE 10 MG PO TABS: 10.0000 mg | ORAL_TABLET | Freq: Every day | ORAL | 3 refills | Status: DC

## 2020-03-15 MED ORDER — METOPROLOL SUCCINATE ER 100 MG PO TB24: 100.0000 mg | ORAL_TABLET | Freq: Two times a day (BID) | ORAL | 3 refills | Status: DC

## 2020-03-15 MED ORDER — LOSARTAN POTASSIUM 50 MG PO TABS: 50.0000 mg | ORAL_TABLET | Freq: Every day | ORAL | 3 refills | Status: DC

## 2020-03-15 NOTE — Patient Instructions (Signed)
Medication Instructions:  INCREASE TOPROL XL TO 100 MG TWO TIMES DAILY   Labwork: I WILL REQUEST LABS FROM PCP  Testing/Procedures: NONE  Follow-Up: Your physician recommends that you schedule a follow-up appointment in: 2-3 MONTHS    Any Other Special Instructions Will Be Listed Below (If Applicable).     If you need a refill on your cardiac medications before your next appointment, please call your pharmacy.

## 2020-04-19 ENCOUNTER — Telehealth: Payer: Self-pay | Admitting: Cardiology

## 2020-04-19 NOTE — Telephone Encounter (Signed)
The FDA has put out a specific warning for this supplement to avoid taking it due to potential severe side effects and unclear ingredients. The information is online if they would like to research it. I would avoid this. Better off discussing with pcp about a prescription for viagra or cialis if erectile dysfunction is the issue   J Corlene Sabia MD

## 2020-04-19 NOTE — Telephone Encounter (Signed)
Pt came into office and stated the medication is called 39 Max Male Enhancement

## 2020-04-19 NOTE — Telephone Encounter (Signed)
Per phone call from pt's wife-- Pt is wanting to take an OTC medication and he's wanting to make sure it's ok first.   Wife is not sure what the name of the medication is, he has it with him   Please call @ [phone]

## 2020-04-19 NOTE — Telephone Encounter (Signed)
Pt voiced understanding and will discuss with pcp

## 2020-05-15 ENCOUNTER — Ambulatory Visit: Payer: Medicaid Other | Admitting: Student

## 2020-05-16 ENCOUNTER — Ambulatory Visit (INDEPENDENT_AMBULATORY_CARE_PROVIDER_SITE_OTHER): Payer: Medicaid Other | Admitting: Student

## 2020-05-16 ENCOUNTER — Encounter: Payer: Self-pay | Admitting: Student

## 2020-05-16 ENCOUNTER — Encounter: Payer: Self-pay | Admitting: *Deleted

## 2020-05-16 ENCOUNTER — Other Ambulatory Visit: Payer: Self-pay

## 2020-05-16 VITALS — BP 122/64 | HR 88 | Ht 75.5 in | Wt 317.0 lb

## 2020-05-16 DIAGNOSIS — R000 Tachycardia, unspecified: Secondary | ICD-10-CM

## 2020-05-16 DIAGNOSIS — I10 Essential (primary) hypertension: Secondary | ICD-10-CM | POA: Diagnosis not present

## 2020-05-16 DIAGNOSIS — E785 Hyperlipidemia, unspecified: Secondary | ICD-10-CM

## 2020-05-16 DIAGNOSIS — N183 Chronic kidney disease, stage 3 unspecified: Secondary | ICD-10-CM

## 2020-05-16 DIAGNOSIS — I2510 Atherosclerotic heart disease of native coronary artery without angina pectoris: Secondary | ICD-10-CM

## 2020-05-16 DIAGNOSIS — Z720 Tobacco use: Secondary | ICD-10-CM

## 2020-05-16 MED ORDER — POTASSIUM CHLORIDE CRYS ER 20 MEQ PO TBCR: 20.0000 meq | EXTENDED_RELEASE_TABLET | Freq: Two times a day (BID) | ORAL | 3 refills | Status: DC

## 2020-05-16 NOTE — Progress Notes (Signed)
Cardiology Office Note    Date:  05/16/2020   ID:  Wayne Spencer, DOB [birth year], MRN [id number]  PCP:  Celene Squibb, MD  Cardiologist: Carlyle Dolly, MD    Chief Complaint  Patient presents with  . Follow-up    2 month visit    History of Present Illness:    Wayne Spencer is a 49 y.o. male with past medical history of CAD (s/p DES to RCA in 2014), mild ischemic cardiomyopathy (EF 45-50% by echo in 2014), nephrotic syndrome, Stage 3 CKD, HTN, HLD and tobacco use who presents to the office today for 11-month follow-up.  He was last examined by myself in 03/2020 reported having baseline dyspnea on exertion in the setting of COPD and had scheduled follow-up with [facility] Pulmonology. He was smoking 2 to 3 packs of cigarettes on a daily basis and was also consuming 12 16 ounce bottles of soda. He had experienced worsening palpitations since Toprol-XL was previously reduced, therefore this was titrated to 100 mg twice daily. He was continued on ASA 81 mg daily, Crestor 40 mg daily, Amlodipine 10 mg daily, Lasix 40mg  daily, Hydralazine 100 mg TID and Losartan 50 mg daily.  In talking with the patient today, he reports overall doing well from a cardiac perspective since his last visit. His palpitations did improve following dose adjustment of Toprol-XL at the time of his last visit. He denies any recent exertional chest pain and does have baseline dyspnea on exertion due to COPD. He does use his inhalers as needed which is typically more at night. He reports having pulled a muscle along the left side of his neck a few nights ago and his pain is worse with turning his head from side to side. This does continue to improve.  He denies any specific orthopnea or PND. He does experience intermittent lower extremity edema and reports compliance with Lasix 40 mg daily. Still consuming a significant amount of soda (6+ 20 ounce bottles on a daily basis). He has follow-up scheduled with Nephrology for next  week.  Past Medical History:  Diagnosis Date  . CAD (coronary artery disease)    a. s/p DES to RCA in 2014  . Cellulitis   . Chronic headache   . Hyperlipidemia   . Hypertension   . Nephrotic syndrome   . Tobacco abuse     Past Surgical History:  Procedure Laterality Date  . CORONARY STENT PLACEMENT    . LEFT HEART CATHETERIZATION WITH CORONARY ANGIOGRAM N/A 08/10/2013   Procedure: LEFT HEART CATHETERIZATION WITH CORONARY ANGIOGRAM;  Surgeon: [name], MD;  Location: [facility] CATH LAB;  Service: Cardiovascular;  Laterality: N/A;  . PERCUTANEOUS CORONARY STENT INTERVENTION (PCI-S) N/A 08/11/2013   Procedure: PERCUTANEOUS CORONARY STENT INTERVENTION (PCI-S);  Surgeon: Blane Ohara, MD;  Location: [facility] CATH LAB;  Service: Cardiovascular;  Laterality: N/A;    Current Medications: Outpatient Medications Prior to Visit  Medication Sig Dispense Refill  . acetaminophen (TYLENOL) 500 MG tablet Take 1,000 mg by mouth every 6 (six) hours as needed for headache.    . albuterol (PROVENTIL HFA;VENTOLIN HFA) 108 (90 Base) MCG/ACT inhaler Inhale 1-2 puffs into the lungs every 6 (six) hours as needed for wheezing or shortness of breath.    . ALPRAZolam (XANAX) 1 MG tablet Take 1 mg by mouth 3 (three) times daily as needed for anxiety.    Marland Kitchen amLODipine (NORVASC) 10 MG tablet Take 1 tablet (10 mg total) by mouth daily. 180 tablet  3  . aspirin EC 81 MG EC tablet Take 1 tablet (81 mg total) by mouth daily.    . cyclobenzaprine (FLEXERIL) 10 MG tablet Take 10 mg by mouth 3 (three) times daily as needed for muscle spasms.    Marland Kitchen ezetimibe (ZETIA) 10 MG tablet Take 1 tablet (10 mg total) by mouth daily. 90 tablet 3  . furosemide (LASIX) 40 MG tablet TAKE 40 MG DAILY. MAY TAKE AN ADDITIONAL 20 MG AS NEEDED FOR SEVERE LEG SWELLING. 90 tablet 3  . hydrALAZINE (APRESOLINE) 50 MG tablet Take 100 mg by mouth 3 (three) times daily. Recently increased by Dr. Elmarie Shiley (kidney MD).    Marland Kitchen HYDROcodone-acetaminophen  (NORCO) 7.5-325 MG per tablet Take 1 tablet by mouth every 4 (four) hours as needed for pain.    Marland Kitchen losartan (COZAAR) 50 MG tablet Take 1 tablet (50 mg total) by mouth daily. 90 tablet 3  . metoprolol succinate (TOPROL-XL) 100 MG 24 hr tablet Take 1 tablet (100 mg total) by mouth in the morning and at bedtime. Take with or immediately following a meal. 180 tablet 3  . nitroGLYCERIN (NITROSTAT) 0.4 MG SL tablet Place 1 tablet (0.4 mg total) under the tongue every 5 (five) minutes as needed for chest pain. 25 tablet 3  . rosuvastatin (CRESTOR) 40 MG tablet Take 40 mg by mouth daily.    . potassium chloride SA (K-DUR,KLOR-CON) 20 MEQ tablet Take 20 mEq by mouth 2 (two) times daily.      No facility-administered medications prior to visit.     Allergies:   Mobic [meloxicam] and Tramadol   Social History   Socioeconomic History  . Marital status: Married    Spouse name: Not on file  . Number of children: Not on file  . Years of education: Not on file  . Highest education level: Not on file  Occupational History  . Not on file  Tobacco Use  . Smoking status: Current Every Day Smoker    Packs/day: 2.00    Years: 6.00    Pack years: 12.00    Types: Cigarettes    Start date: 02/19/1985  . Smokeless tobacco: Never Used  Vaping Use  . Vaping Use: Never used  Substance and Sexual Activity  . Alcohol use: Yes    Alcohol/week: 0.0 standard drinks    Comment: occasional  . Drug use: No  . Sexual activity: Yes    Partners: Female  Other Topics Concern  . Not on file  Social History Narrative   Lives in [place], [place]. Two children. Works as a Therapist, art and helps change tires.    Social Determinants of Health   Financial Resource Strain:   . Difficulty of Paying Living Expenses:   Food Insecurity:   . Worried About Charity fundraiser in the Last Year:   . Arboriculturist in the Last Year:   Transportation Needs:   . Film/video editor (Medical):   Marland Kitchen Lack of Transportation  (Non-Medical):   Physical Activity:   . Days of Exercise per Week:   . Minutes of Exercise per Session:   Stress:   . Feeling of Stress :   Social Connections:   . Frequency of Communication with Friends and Family:   . Frequency of Social Gatherings with Friends and Family:   . Attends Religious Services:   . Active Member of Clubs or Organizations:   . Attends Archivist Meetings:   Marland Kitchen Marital Status:  Family History:  The patient's family history includes CAD in his brother and mother; Diabetes in his brother; Heart attack in his brother, father, and sister; Kidney failure in his sister.   Review of Systems:   Please see the history of present illness.     General:  No chills, fever, night sweats or weight changes.  Cardiovascular:  No chest pain, orthopnea, palpitations, paroxysmal nocturnal dyspnea. Positive for edema.  Dermatological: No rash, lesions/masses Respiratory: No cough, dyspnea Urologic: No hematuria, dysuria Abdominal:   No nausea, vomiting, diarrhea, bright red blood per rectum, melena, or hematemesis Neurologic:  No visual changes, wkns, changes in mental status.  All other systems reviewed and are otherwise negative except as noted above.   Physical Exam:    VS:  BP 122/64   Pulse 88   Ht 6' 3.5" (1.918 m)   Wt (!) 317 lb (143.8 kg)   SpO2 98%   BMI 39.10 kg/m    General: Well developed, well nourished,male appearing in no acute distress. Head: Normocephalic, atraumatic, sclera non-icteric.  Neck: No carotid bruits. JVD not elevated.  Lungs: Respirations regular and unlabored, without wheezes or rales.  Heart: Regular rate and rhythm. No S3 or S4.  No murmur, no rubs, or gallops appreciated. Abdomen: Soft, non-tender, non-distended. No obvious abdominal masses. Msk:  Strength and tone appear normal for age. No obvious joint deformities or effusions. Extremities: No clubbing or cyanosis. Trace ankle edema bilaterally.  Distal pedal  pulses are 2+ bilaterally. Neuro: Alert and oriented X 3. Moves all extremities spontaneously. No focal deficits noted. Psych:  Responds to questions appropriately with a normal affect. Skin: No rashes or lesions noted  Wt Readings from Last 3 Encounters:  05/16/20 (!) 317 lb (143.8 kg)  03/15/20 (!) 315 lb (142.9 kg)  09/07/18 292 lb (132.5 kg)     Studies/Labs Reviewed:   EKG:  EKG is not ordered today.   Recent Labs: No results found for requested labs within last 8760 hours.   Lipid Panel    Component Value Date/Time   CHOL 172 01/14/2014 1135   TRIG 197 (H) 01/14/2014 1135   HDL 24 (L) 01/14/2014 1135   CHOLHDL 7.2 01/14/2014 1135   VLDL 39 01/14/2014 1135   LDLCALC 109 (H) 01/14/2014 1135    Additional studies/ records that were reviewed today include:   Echocardiogram: 08/2013 Study Conclusions   - Left ventricle: Mid and basal inferior/posterior  hypokinesis The cavity size was mildly dilated. Wall  thickness was normal. Systolic function was mildly  reduced. The estimated ejection fraction was in the range  of 45% to 50%.  - Left atrium: The atrium was mildly dilated.  - Atrial septum: No defect or patent foramen ovale was  identified.  - Pericardium, extracardiac: A trivial pericardial effusion  was identified.   Assessment:    1. Coronary artery disease involving native coronary artery of native heart without angina pectoris   2. Essential hypertension   3. Sinus tachycardia   4. Hyperlipidemia LDL goal <70   5. Stage 3 chronic kidney disease, unspecified whether stage 3a or 3b CKD   6. Tobacco use      Plan:   In order of problems listed above:  1. CAD - He is s/p DES to RCA in 2014.  He denies any recent exertional chest pain and breathing has been at baseline. He does describe a discomfort along the left side of his neck which is clearly worse with positional changes and  reproducible on palpation, most consistent with a  musculoskeletal etiology. - Continue current cardiac medication regimen with ASA 81 mg daily, Zetia 10 mg daily, Toprol-XL 100 mg BID and Crestor 40 mg daily.  2. HTN - BP is well controlled at 122/64 during today's visit. Continue current regimen with Amlodipine 10 mg daily, Lasix 40mg  daily, Toprol-XL 100mg  BID, Hydralazine 100 mg TID and Losartan 50 mg daily.  3. Sinus Tachycardia - He reports his palpitations have now resolved following recent dose adjustment of Toprol-XL.  I will request a copy of most recent labs from his PCP as the last available records by review of Epic are from 2018. I did recommend reducing his caffeine intake.  - Continue Toprol-XL 100mg  BID.   4. HLD - Followed by PCP.Goal LDL is less than 70 in the setting of known CAD.  Will request a copy of most recent records.  He remains on Crestor 40 mg daily and Zetia 10mg  daily.  5. Stage 3 CKD - Followed by [facility]. He has scheduled follow-up next week per his report.  6. Tobacco Use - He continues to smoke between 0.5 - 2.0 ppd. Cessation advised.   Medication Adjustments/Labs and Tests Ordered: Current medicines are reviewed at length with the patient today.  Concerns regarding medicines are outlined above.  Medication changes, Labs and Tests ordered today are listed in the Patient Instructions below. Patient Instructions  Medication Instructions:   No changes  Labwork:  None. We will request labs from your PCP.   Testing/Procedures:  None  Follow-Up:  With Dr. Harl Bowie in 6 months  Any Other Special Instructions Will Be Listed Below (If Applicable).   If you need a refill on your cardiac medications before your next appointment, please call your pharmacy.    Signed, Erma Heritage, PA-C  05/16/2020 4:13 PM    [facility] [address] [place], [place] [address] Phone: [phone] Fax: [phone]

## 2020-05-16 NOTE — Patient Instructions (Signed)
Medication Instructions:   No changes  Labwork:  None. We will request labs from your PCP.   Testing/Procedures:  None  Follow-Up:  With Dr. Harl Bowie in 6 months  Any Other Special Instructions Will Be Listed Below (If Applicable).     If you need a refill on your cardiac medications before your next appointment, please call your pharmacy.

## 2020-09-12 ENCOUNTER — Encounter: Payer: Self-pay | Admitting: Student

## 2020-09-12 ENCOUNTER — Other Ambulatory Visit: Payer: Self-pay

## 2020-09-12 ENCOUNTER — Ambulatory Visit (INDEPENDENT_AMBULATORY_CARE_PROVIDER_SITE_OTHER): Payer: Medicaid Other | Admitting: Student

## 2020-09-12 ENCOUNTER — Encounter: Payer: Self-pay | Admitting: *Deleted

## 2020-09-12 VITALS — BP 116/68 | HR 96 | Ht 75.5 in | Wt 331.0 lb

## 2020-09-12 DIAGNOSIS — R079 Chest pain, unspecified: Secondary | ICD-10-CM | POA: Diagnosis not present

## 2020-09-12 DIAGNOSIS — I25118 Atherosclerotic heart disease of native coronary artery with other forms of angina pectoris: Secondary | ICD-10-CM

## 2020-09-12 DIAGNOSIS — N183 Chronic kidney disease, stage 3 unspecified: Secondary | ICD-10-CM

## 2020-09-12 DIAGNOSIS — I10 Essential (primary) hypertension: Secondary | ICD-10-CM | POA: Diagnosis not present

## 2020-09-12 DIAGNOSIS — E785 Hyperlipidemia, unspecified: Secondary | ICD-10-CM

## 2020-09-12 DIAGNOSIS — Z720 Tobacco use: Secondary | ICD-10-CM

## 2020-09-12 NOTE — Progress Notes (Signed)
Cardiology Office Note    Date:  09/12/2020   ID:  VIOLET CART, DOB 12-07-1970, MRN [id number]  PCP:  Celene Squibb, MD  Cardiologist: Carlyle Dolly, MD    Chief Complaint  Patient presents with  . Follow-up    4 month visit    History of Present Illness:    Wayne Spencer is a 49 y.o. male with past medical history of CAD (s/p DES to RCA in 2014), mild ischemic cardiomyopathy (EF 45-50% by echo in 2014), nephrotic syndrome, Stage 3 CKD, HTN, HLD, COPD and tobacco use who presents to the office today for 25-month follow-up.  He was last examined by myself in 05/2020 and reported that his palpitations had improved after recent titration of Toprol-XL. He denied any recent chest pain and his breathing has been at baseline. He was encouraged to reduce his caffeine intake. Was continued on his current medication regimen.  In talking with the patient today, he reports having intermittent episodes of chest discomfort over the past several weeks which he reports feels like a tightness along his sternal region and radiates into his left precordium. This can occur at rest or with activity and has lasted for over 3 hours at a time. Not worse with positional changes or inspiration. He denies any associated radiating pain into his arm or jaw. No recent orthopnea, PND or lower extremity edema. He does have chronic dyspnea on exertion. Continues to smoke approximately 1.5 ppd.   Past Medical History:  Diagnosis Date  . CAD (coronary artery disease)    a. s/p DES to RCA in 2014  . Cellulitis   . Chronic headache   . Hyperlipidemia   . Hypertension   . Nephrotic syndrome   . Tobacco abuse     Past Surgical History:  Procedure Laterality Date  . CORONARY STENT PLACEMENT    . LEFT HEART CATHETERIZATION WITH CORONARY ANGIOGRAM N/A 08/10/2013   Procedure: LEFT HEART CATHETERIZATION WITH CORONARY ANGIOGRAM;  Surgeon: [name], MD;  Location: [facility] CATH LAB;  Service: Cardiovascular;   Laterality: N/A;  . PERCUTANEOUS CORONARY STENT INTERVENTION (PCI-S) N/A 08/11/2013   Procedure: PERCUTANEOUS CORONARY STENT INTERVENTION (PCI-S);  Surgeon: Blane Ohara, MD;  Location: [facility] CATH LAB;  Service: Cardiovascular;  Laterality: N/A;    Current Medications: Outpatient Medications Prior to Visit  Medication Sig Dispense Refill  . acetaminophen (TYLENOL) 500 MG tablet Take 1,000 mg by mouth every 6 (six) hours as needed for headache.    . albuterol (PROVENTIL HFA;VENTOLIN HFA) 108 (90 Base) MCG/ACT inhaler Inhale 1-2 puffs into the lungs every 6 (six) hours as needed for wheezing or shortness of breath.    . ALPRAZolam (XANAX) 1 MG tablet Take 1 mg by mouth 3 (three) times daily as needed for anxiety.    Marland Kitchen amLODipine (NORVASC) 10 MG tablet Take 1 tablet (10 mg total) by mouth daily. 180 tablet 3  . aspirin EC 81 MG EC tablet Take 1 tablet (81 mg total) by mouth daily.    . cyclobenzaprine (FLEXERIL) 10 MG tablet Take 10 mg by mouth 3 (three) times daily as needed for muscle spasms.    Marland Kitchen ezetimibe (ZETIA) 10 MG tablet Take 1 tablet (10 mg total) by mouth daily. 90 tablet 3  . furosemide (LASIX) 40 MG tablet TAKE 40 MG DAILY. MAY TAKE AN ADDITIONAL 20 MG AS NEEDED FOR SEVERE LEG SWELLING. 90 tablet 3  . hydrALAZINE (APRESOLINE) 50 MG tablet Take 100 mg by mouth  3 (three) times daily. Recently increased by Dr. Elmarie Shiley (kidney MD).    Marland Kitchen HYDROcodone-acetaminophen (NORCO) 7.5-325 MG per tablet Take 1 tablet by mouth every 4 (four) hours as needed for pain.    Marland Kitchen losartan (COZAAR) 50 MG tablet Take 1 tablet (50 mg total) by mouth daily. 90 tablet 3  . metoprolol succinate (TOPROL-XL) 100 MG 24 hr tablet Take 1 tablet (100 mg total) by mouth in the morning and at bedtime. Take with or immediately following a meal. 180 tablet 3  . nitroGLYCERIN (NITROSTAT) 0.4 MG SL tablet Place 1 tablet (0.4 mg total) under the tongue every 5 (five) minutes as needed for chest pain. 25 tablet 3  . potassium  chloride SA (KLOR-CON) 20 MEQ tablet Take 1 tablet (20 mEq total) by mouth 2 (two) times daily. 180 tablet 3  . rosuvastatin (CRESTOR) 40 MG tablet Take 40 mg by mouth daily.     No facility-administered medications prior to visit.     Allergies:   Mobic [meloxicam], Penicillins, and Tramadol   Social History   Socioeconomic History  . Marital status: Married    Spouse name: Not on file  . Number of children: Not on file  . Years of education: Not on file  . Highest education level: Not on file  Occupational History  . Not on file  Tobacco Use  . Smoking status: Current Every Day Smoker    Packs/day: 2.00    Years: 6.00    Pack years: 12.00    Types: Cigarettes    Start date: 02/19/1985  . Smokeless tobacco: Never Used  Vaping Use  . Vaping Use: Never used  Substance and Sexual Activity  . Alcohol use: Yes    Alcohol/week: 0.0 standard drinks    Comment: occasional  . Drug use: No  . Sexual activity: Yes    Partners: Female  Other Topics Concern  . Not on file  Social History Narrative   Lives in [place], [place]. Two children. Works as a Therapist, art and helps change tires.    Social Determinants of Health   Financial Resource Strain:   . Difficulty of Paying Living Expenses: Not on file  Food Insecurity:   . Worried About Charity fundraiser in the Last Year: Not on file  . Ran Out of Food in the Last Year: Not on file  Transportation Needs:   . Lack of Transportation (Medical): Not on file  . Lack of Transportation (Non-Medical): Not on file  Physical Activity:   . Days of Exercise per Week: Not on file  . Minutes of Exercise per Session: Not on file  Stress:   . Feeling of Stress : Not on file  Social Connections:   . Frequency of Communication with Friends and Family: Not on file  . Frequency of Social Gatherings with Friends and Family: Not on file  . Attends Religious Services: Not on file  . Active Member of Clubs or Organizations: Not on file  . Attends  Archivist Meetings: Not on file  . Marital Status: Not on file     Family History:  The patient's family history includes CAD in his brother and mother; Diabetes in his brother; Heart attack in his brother, father, and sister; Kidney failure in his sister.   Review of Systems:   Please see the history of present illness.     General:  No chills, fever, night sweats or weight changes.  Cardiovascular:  No edema, orthopnea, palpitations,  paroxysmal nocturnal dyspnea. Positive for chest pain and dyspnea on exertion.  Dermatological: No rash, lesions/masses Respiratory: No cough, dyspnea Urologic: No hematuria, dysuria Abdominal:   No nausea, vomiting, diarrhea, bright red blood per rectum, melena, or hematemesis Neurologic:  No visual changes, wkns, changes in mental status. All other systems reviewed and are otherwise negative except as noted above.   Physical Exam:    VS:  BP 116/68   Pulse 96   Ht 6' 3.5" (1.918 m)   Wt (!) 331 lb (150.1 kg)   SpO2 94%   BMI 40.83 kg/m    General: Well developed, obese male appearing in no acute distress. Head: Normocephalic, atraumatic. Neck: No carotid bruits. JVD not elevated.  Lungs: Respirations regular and unlabored, without wheezes or rales.  Heart: Regular rate and rhythm. No S3 or S4.  No murmur, no rubs, or gallops appreciated. Abdomen: Appears non-distended. No obvious abdominal masses. Msk:  Strength and tone appear normal for age. No obvious joint deformities or effusions. Extremities: No clubbing or cyanosis. No lower extremity edema.  Distal pedal pulses are 2+ bilaterally. Neuro: Alert and oriented X 3. Moves all extremities spontaneously. No focal deficits noted. Psych:  Responds to questions appropriately with a normal affect. Skin: No rashes or lesions noted  Wt Readings from Last 3 Encounters:  09/12/20 (!) 331 lb (150.1 kg)  05/16/20 (!) 317 lb (143.8 kg)  03/15/20 (!) 315 lb (142.9 kg)     Studies/Labs  Reviewed:   EKG:  EKG is ordered today.  The ekg ordered today demonstrates NSR, HR 92 with no acute ST changes when compared to prior tracings.   Recent Labs: No results found for requested labs within last 8760 hours.   Lipid Panel    Component Value Date/Time   CHOL 172 01/14/2014 1135   TRIG 197 (H) 01/14/2014 1135   HDL 24 (L) 01/14/2014 1135   CHOLHDL 7.2 01/14/2014 1135   VLDL 39 01/14/2014 1135   LDLCALC 109 (H) 01/14/2014 1135    Additional studies/ records that were reviewed today include:   Echocardiogram: 08/2013 Study Conclusions   - Left ventricle: Mid and basal inferior/posterior  hypokinesis The cavity size was mildly dilated. Wall  thickness was normal. Systolic function was mildly  reduced. The estimated ejection fraction was in the range  of 45% to 50%.  - Left atrium: The atrium was mildly dilated.  - Atrial septum: No defect or patent foramen ovale was  identified.  - Pericardium, extracardiac: A trivial pericardial effusion  was identified.    Assessment:    1. Coronary artery disease involving native coronary artery of native heart with other form of angina pectoris (HCC)   2. Chest pain, unspecified type   3. Essential hypertension   4. Hyperlipidemia LDL goal <70   5. Stage 3 chronic kidney disease, unspecified whether stage 3a or 3b CKD ([place])   6. Tobacco use      Plan:   In order of problems listed above:  1. Chest Pain/CAD - He is s/p DES to RCA in 2014 and reports recent episodes of chest pain over the past few weeks which can occur at rest or with activity and can last from minutes to hours. EKG today is without acute ST changes.  - Will plan to obtain a Lexiscan Myoview for ischemic evaluation. Unless high-risk, would focus on medical management given his underlying CKD. Continue current medication regimen with ASA 81mg  daily, Zetia, Toprol-XL and Crestor. Would consider the addition  of Imdur pending his stress test  results. He does have an updated Rx for SL NTG and we reviewed he should seek emergent evaluation in the interim if he has recurrent symptoms which do not resolve with SL NTG x3.   2. HTN - BP is well-controlled at 116/68 during today's visit. Continue current medication regimen with Amlodipine 10mg  daily, Hydralazine 100mg  TID, Losartan 50mg  daily and Toprol-XL 100mg  BID.   3. HLD - Followed by his PCP. He remains on Crestor 40mg  daily and Zetia 10mg  daily.   4. Stage 3 CKD - Creatinine was at 1.95 in 01/2020. Remains on Lasix 40mg  daily. Will request a copy of most recent labs from his PCP. Followed by Dr. Posey Pronto.   5. Tobacco Use - He continues to smoke approximately 1.5 ppd. Cessation advised.    Medication Adjustments/Labs and Tests Ordered: Current medicines are reviewed at length with the patient today.  Concerns regarding medicines are outlined above.  Medication changes, Labs and Tests ordered today are listed in the Patient Instructions below. Patient Instructions  Medication Instructions:  Your physician recommends that you continue on your current medications as directed. Please refer to the Current Medication list given to you today.  Bring you Albuterol to the stress test   *If you need a refill on your cardiac medications before your next appointment, please call your pharmacy*   Lab Work: NONE   If you have labs (blood work) drawn today and your tests are completely normal, you will receive your results only by: Marland Kitchen MyChart Message (if you have MyChart) OR . A paper copy in the mail If you have any lab test that is abnormal or we need to change your treatment, we will call you to review the results.   Testing/Procedures: Your physician has requested that you have a lexiscan myoview. For further information please visit [email or web address]. Please follow instruction sheet, as given.   Follow-Up: At [facility], you and your health needs are our priority.  As  part of our continuing mission to provide you with exceptional heart care, we have created designated Provider Care Teams.  These Care Teams include your primary Cardiologist (physician) and Advanced Practice Providers (APPs -  Physician Assistants and Nurse Practitioners) who all work together to provide you with the care you need, when you need it.  We recommend signing up for the patient portal called "MyChart".  Sign up information is provided on this After Visit Summary.  MyChart is used to connect with patients for Virtual Visits (Telemedicine).  Patients are able to view lab/test results, encounter notes, upcoming appointments, etc.  Non-urgent messages can be sent to your provider as well.   To learn more about what you can do with MyChart, go to [email or web address].    Your next appointment:   4-6 week(s)  The format for your next appointment:   In Person  Provider:   Bernerd Pho, PA-C   Other Instructions Thank you for choosing [facility]!       Signed, Erma Heritage, PA-C  09/12/2020 8:23 PM    [facility] [address] [place], [place] [address] Phone: [phone] Fax: [phone]

## 2020-09-12 NOTE — Patient Instructions (Signed)
Medication Instructions:  Your physician recommends that you continue on your current medications as directed. Please refer to the Current Medication list given to you today.  Bring you Albuterol to the stress test   *If you need a refill on your cardiac medications before your next appointment, please call your pharmacy*   Lab Work: NONE   If you have labs (blood work) drawn today and your tests are completely normal, you will receive your results only by: Marland Kitchen MyChart Message (if you have MyChart) OR . A paper copy in the mail If you have any lab test that is abnormal or we need to change your treatment, we will call you to review the results.   Testing/Procedures: Your physician has requested that you have a lexiscan myoview. For further information please visit [email or web address]. Please follow instruction sheet, as given.   Follow-Up: At [facility], you and your health needs are our priority.  As part of our continuing mission to provide you with exceptional heart care, we have created designated Provider Care Teams.  These Care Teams include your primary Cardiologist (physician) and Advanced Practice Providers (APPs -  Physician Assistants and Nurse Practitioners) who all work together to provide you with the care you need, when you need it.  We recommend signing up for the patient portal called "MyChart".  Sign up information is provided on this After Visit Summary.  MyChart is used to connect with patients for Virtual Visits (Telemedicine).  Patients are able to view lab/test results, encounter notes, upcoming appointments, etc.  Non-urgent messages can be sent to your provider as well.   To learn more about what you can do with MyChart, go to [email or web address].    Your next appointment:   4-6 week(s)  The format for your next appointment:   In Person  Provider:   Bernerd Pho, PA-C   Other Instructions Thank you for choosing [facility]!

## 2020-09-20 ENCOUNTER — Encounter (HOSPITAL_COMMUNITY): Payer: Medicaid Other

## 2020-09-20 ENCOUNTER — Other Ambulatory Visit (HOSPITAL_COMMUNITY): Payer: Medicaid Other

## 2020-09-26 ENCOUNTER — Ambulatory Visit: Payer: Medicaid Other | Admitting: Physician Assistant

## 2020-09-27 ENCOUNTER — Encounter (HOSPITAL_BASED_OUTPATIENT_CLINIC_OR_DEPARTMENT_OTHER)
Admission: RE | Admit: 2020-09-27 | Discharge: 2020-09-27 | Disposition: A | Discharge status: Home / Self Care | Payer: Medicaid Other | Source: Ambulatory Visit | Attending: Student | Admitting: Student

## 2020-09-27 ENCOUNTER — Encounter (HOSPITAL_COMMUNITY)
Admission: RE | Admit: 2020-09-27 | Discharge: 2020-09-27 | Disposition: A | Discharge status: Home / Self Care | Payer: Medicaid Other | Source: Ambulatory Visit | Attending: Nurse Practitioner | Admitting: Nurse Practitioner

## 2020-09-27 ENCOUNTER — Other Ambulatory Visit: Payer: Self-pay

## 2020-09-27 ENCOUNTER — Encounter (HOSPITAL_COMMUNITY): Payer: Self-pay

## 2020-09-27 DIAGNOSIS — R079 Chest pain, unspecified: Secondary | ICD-10-CM

## 2020-09-27 HISTORY — DX: Disorder of kidney and ureter, unspecified: N28.9

## 2020-09-27 LAB — NM MYOCAR MULTI W/SPECT W/WALL MOTION / EF
LV dias vol: 145 mL (ref 62–150)
LV sys vol: 70 mL
Peak HR: 107 {beats}/min
RATE: 0.42
Rest HR: 85 {beats}/min
SDS: 3
SRS: 3
SSS: 6
TID: 1.13

## 2020-09-27 MED ORDER — REGADENOSON 0.4 MG/5ML IV SOLN
INTRAVENOUS | Status: AC
  Administered 2020-09-27: 0.4 mg via INTRAVENOUS
  Filled 2020-09-27: qty 5

## 2020-09-27 MED ORDER — TECHNETIUM TC 99M TETROFOSMIN IV KIT
10.0000 | PACK | Freq: Once | INTRAVENOUS | Status: AC | PRN
  Administered 2020-09-27: 11 via INTRAVENOUS

## 2020-09-27 MED ORDER — TECHNETIUM TC 99M TETROFOSMIN IV KIT
30.0000 | PACK | Freq: Once | INTRAVENOUS | Status: AC | PRN
  Administered 2020-09-27: 32.5 via INTRAVENOUS

## 2020-09-27 MED ORDER — SODIUM CHLORIDE FLUSH 0.9 % IV SOLN
INTRAVENOUS | Status: AC
  Administered 2020-09-27: 10 mL via INTRAVENOUS
  Filled 2020-09-27: qty 10

## 2020-09-28 ENCOUNTER — Telehealth: Payer: Self-pay | Admitting: *Deleted

## 2020-09-28 MED ORDER — ISOSORBIDE MONONITRATE ER 30 MG PO TB24: 30.0000 mg | ORAL_TABLET | Freq: Every day | ORAL | 3 refills | Status: DC

## 2020-09-28 NOTE — Telephone Encounter (Signed)
Called patient with test results. No answer. Left message to call back. Order placed

## 2020-09-28 NOTE — Telephone Encounter (Signed)
-----   Message from Erma Heritage, [place] sent at 09/28/2020 10:49 AM EDT ----- Please let the patient know his stress test showed scaring consistent with his prior heart attack and an area of very minor ischemia (small blockage). Was read as a low-risk study. Given the overall low-risk findings, would focus on medical therapy for now given his underlying kidney disease. Would add Imdur 30mg  daily (can start with taking 15mg  daily then increase to 30mg  daily). If he continues to experience progressive symptoms could then discuss cath but again this would have more risks given his CKD.

## 2020-10-02 ENCOUNTER — Encounter: Payer: Self-pay | Admitting: *Deleted

## 2020-10-08 ENCOUNTER — Telehealth: Payer: Self-pay | Admitting: Student

## 2020-10-08 NOTE — Telephone Encounter (Signed)
Spoke with wife. Notified that attempted to reach pt by phone X3 and then letter was sent. Unable to give results to wife d/t not being listed on DPR. Wife stated that she would have pt call to get results.

## 2020-10-08 NOTE — Telephone Encounter (Signed)
New message     Patient wife is calling for test results , said patient had test 2 weeks ago and has never got a call back

## 2020-10-19 ENCOUNTER — Ambulatory Visit: Payer: Medicaid Other | Admitting: Student

## 2020-10-19 ENCOUNTER — Encounter: Payer: Self-pay | Admitting: Student

## 2020-10-19 ENCOUNTER — Other Ambulatory Visit: Payer: Self-pay

## 2020-10-19 VITALS — BP 130/72 | HR 85 | Ht 75.5 in | Wt 330.0 lb

## 2020-10-19 DIAGNOSIS — N183 Chronic kidney disease, stage 3 unspecified: Secondary | ICD-10-CM

## 2020-10-19 DIAGNOSIS — I10 Essential (primary) hypertension: Secondary | ICD-10-CM

## 2020-10-19 DIAGNOSIS — I25118 Atherosclerotic heart disease of native coronary artery with other forms of angina pectoris: Secondary | ICD-10-CM | POA: Diagnosis not present

## 2020-10-19 DIAGNOSIS — E785 Hyperlipidemia, unspecified: Secondary | ICD-10-CM | POA: Diagnosis not present

## 2020-10-19 NOTE — Patient Instructions (Signed)

## 2020-10-19 NOTE — Progress Notes (Signed)
Cardiology Office Note    Date:  10/19/2020   ID:  Wayne Spencer, DOB 03/11/1971, MRN [id number]  PCP:  Celene Squibb, MD  Cardiologist: Carlyle Dolly, MD    Chief Complaint  Patient presents with  . Follow-up    6 week visit    History of Present Illness:    Wayne Spencer is a 49 y.o. male with past medical history of CAD (s/p DES to RCA in 2014), mild ischemic cardiomyopathy (EF 45-50% by echo in 2014), nephrotic syndrome, Stage 3 CKD, HTN, HLD, COPD and tobacco usewho presents to the office today for 6-week follow-up.  He was last examined myself in 08/2020 and reported episodes of intermittent chest discomfort over the past few weeks which could occur at rest or with activity and could last over 3 hours at a time. Given the timeframe since his last ischemic evaluation, a Lexiscan Myoview was recommended. The goal was to focus on medical management unless the study was high-risk given his underlying CKD. His stress test showed a medium sized, mild intensity, partially reversible inferior defect from the apex to base suggesting scar with minor inferoseptal ischemia. EF was 52% and the study was overall low-risk. It was recommended he start Imdur 30 mg daily to see if this would help with his symptoms.   In talking with the patient today, he reports overall doing well since his last office visit. He reports only one episode of chest pain in the interim. He has been taking Imdur 15 mg daily at night due to headaches during the day. He denies any orthopnea, PND or lower extremity edema. He does have baseline dyspnea on exertion but has been trying to increase his physical activity to lose weight.  He continues to smoke 1.5 - 2.0 ppd and says his use is driven by the stress in his life at this time.   Past Medical History:  Diagnosis Date  . CAD (coronary artery disease)    a. s/p DES to RCA in 2014  . Cellulitis   . Chronic headache   . Hyperlipidemia   . Hypertension   .  Nephrotic syndrome   . Renal insufficiency   . Tobacco abuse     Past Surgical History:  Procedure Laterality Date  . CORONARY STENT PLACEMENT    . LEFT HEART CATHETERIZATION WITH CORONARY ANGIOGRAM N/A 08/10/2013   Procedure: LEFT HEART CATHETERIZATION WITH CORONARY ANGIOGRAM;  Surgeon: [name], MD;  Location: [facility] CATH LAB;  Service: Cardiovascular;  Laterality: N/A;  . PERCUTANEOUS CORONARY STENT INTERVENTION (PCI-S) N/A 08/11/2013   Procedure: PERCUTANEOUS CORONARY STENT INTERVENTION (PCI-S);  Surgeon: Blane Ohara, MD;  Location: [facility] CATH LAB;  Service: Cardiovascular;  Laterality: N/A;    Current Medications: Outpatient Medications Prior to Visit  Medication Sig Dispense Refill  . acetaminophen (TYLENOL) 500 MG tablet Take 1,000 mg by mouth every 6 (six) hours as needed for headache.    . albuterol (PROVENTIL HFA;VENTOLIN HFA) 108 (90 Base) MCG/ACT inhaler Inhale 1-2 puffs into the lungs every 6 (six) hours as needed for wheezing or shortness of breath.    . ALPRAZolam (XANAX) 1 MG tablet Take 1 mg by mouth 3 (three) times daily as needed for anxiety.    Marland Kitchen amLODipine (NORVASC) 10 MG tablet Take 1 tablet (10 mg total) by mouth daily. 180 tablet 3  . aspirin EC 81 MG EC tablet Take 1 tablet (81 mg total) by mouth daily.    . cyclobenzaprine (FLEXERIL)  10 MG tablet Take 10 mg by mouth 3 (three) times daily as needed for muscle spasms.    Marland Kitchen ezetimibe (ZETIA) 10 MG tablet Take 1 tablet (10 mg total) by mouth daily. 90 tablet 3  . furosemide (LASIX) 40 MG tablet TAKE 40 MG DAILY. MAY TAKE AN ADDITIONAL 20 MG AS NEEDED FOR SEVERE LEG SWELLING. 90 tablet 3  . hydrALAZINE (APRESOLINE) 50 MG tablet Take 100 mg by mouth 3 (three) times daily. Recently increased by Dr. Elmarie Shiley (kidney MD).    Marland Kitchen HYDROcodone-acetaminophen (NORCO) 7.5-325 MG per tablet Take 1 tablet by mouth every 4 (four) hours as needed for pain.    . isosorbide mononitrate (IMDUR) 30 MG 24 hr tablet Take 15 mg by mouth  daily.    . nitroGLYCERIN (NITROSTAT) 0.4 MG SL tablet Place 1 tablet (0.4 mg total) under the tongue every 5 (five) minutes as needed for chest pain. 25 tablet 3  . potassium chloride SA (KLOR-CON) 20 MEQ tablet Take 1 tablet (20 mEq total) by mouth 2 (two) times daily. 180 tablet 3  . rosuvastatin (CRESTOR) 40 MG tablet Take 40 mg by mouth daily.    . isosorbide mononitrate (IMDUR) 30 MG 24 hr tablet Take 1 tablet (30 mg total) by mouth daily. (Patient taking differently: Take 15 mg by mouth daily. ) 90 tablet 3  . losartan (COZAAR) 50 MG tablet Take 1 tablet (50 mg total) by mouth daily. 90 tablet 3  . metoprolol succinate (TOPROL-XL) 100 MG 24 hr tablet Take 1 tablet (100 mg total) by mouth in the morning and at bedtime. Take with or immediately following a meal. 180 tablet 3   No facility-administered medications prior to visit.     Allergies:   Mobic [meloxicam], Penicillins, and Tramadol   Social History   Socioeconomic History  . Marital status: Married    Spouse name: Not on file  . Number of children: Not on file  . Years of education: Not on file  . Highest education level: Not on file  Occupational History  . Not on file  Tobacco Use  . Smoking status: Current Every Day Smoker    Packs/day: 2.00    Years: 6.00    Pack years: 12.00    Types: Cigarettes    Start date: 02/19/1985  . Smokeless tobacco: Never Used  Vaping Use  . Vaping Use: Never used  Substance and Sexual Activity  . Alcohol use: Yes    Alcohol/week: 0.0 standard drinks    Comment: occasional  . Drug use: No  . Sexual activity: Yes    Partners: Female  Other Topics Concern  . Not on file  Social History Narrative   Lives in [place], [place]. Two children. Works as a Therapist, art and helps change tires.    Social Determinants of Health   Financial Resource Strain:   . Difficulty of Paying Living Expenses: Not on file  Food Insecurity:   . Worried About Charity fundraiser in the Last Year: Not on  file  . Ran Out of Food in the Last Year: Not on file  Transportation Needs:   . Lack of Transportation (Medical): Not on file  . Lack of Transportation (Non-Medical): Not on file  Physical Activity:   . Days of Exercise per Week: Not on file  . Minutes of Exercise per Session: Not on file  Stress:   . Feeling of Stress : Not on file  Social Connections:   . Frequency of Communication  with Friends and Family: Not on file  . Frequency of Social Gatherings with Friends and Family: Not on file  . Attends Religious Services: Not on file  . Active Member of Clubs or Organizations: Not on file  . Attends Archivist Meetings: Not on file  . Marital Status: Not on file     Family History:  The patient's family history includes CAD in his brother and mother; Diabetes in his brother; Heart attack in his brother, father, and sister; Kidney failure in his sister.   Review of Systems:   Please see the history of present illness.     General:  No chills, fever, night sweats or weight changes.  Cardiovascular:  No edema, orthopnea, palpitations, paroxysmal nocturnal dyspnea. Positive for chest pain (improved).  Dermatological: No rash, lesions/masses Respiratory: No cough, dyspnea Urologic: No hematuria, dysuria Abdominal:   No nausea, vomiting, diarrhea, bright red blood per rectum, melena, or hematemesis Neurologic:  No visual changes, wkns, changes in mental status. All other systems reviewed and are otherwise negative except as noted above.   Physical Exam:    VS:  BP 130/72   Pulse 85   Ht 6' 3.5" (1.918 m)   Wt (!) 330 lb (149.7 kg)   SpO2 96%   BMI 40.70 kg/m    General: Well developed, obese male appearing in no acute distress. Head: Normocephalic, atraumatic. Neck: No carotid bruits. JVD not elevated.  Lungs: Respirations regular and unlabored, without wheezes or rales.  Heart: Regular rate and rhythm. No S3 or S4.  No murmur, no rubs, or gallops  appreciated. Abdomen: Appears non-distended. No obvious abdominal masses. Msk:  Strength and tone appear normal for age. No obvious joint deformities or effusions. Extremities: No clubbing or cyanosis. Trace ankle edema bilaterally.  Distal pedal pulses are 2+ bilaterally. Neuro: Alert and oriented X 3. Moves all extremities spontaneously. No focal deficits noted. Psych:  Responds to questions appropriately with a normal affect. Skin: No rashes or lesions noted  Wt Readings from Last 3 Encounters:  10/19/20 (!) 330 lb (149.7 kg)  09/12/20 (!) 331 lb (150.1 kg)  05/16/20 (!) 317 lb (143.8 kg)     Studies/Labs Reviewed:   EKG:  EKG is not ordered today.   Recent Labs: No results found for requested labs within last 8760 hours.   Lipid Panel    Component Value Date/Time   CHOL 172 01/14/2014 1135   TRIG 197 (H) 01/14/2014 1135   HDL 24 (L) 01/14/2014 1135   CHOLHDL 7.2 01/14/2014 1135   VLDL 39 01/14/2014 1135   LDLCALC 109 (H) 01/14/2014 1135    Additional studies/ records that were reviewed today include:   Echocardiogram: 08/2013 Study Conclusions   - Left ventricle: Mid and basal inferior/posterior  hypokinesis The cavity size was mildly dilated. Wall  thickness was normal. Systolic function was mildly  reduced. The estimated ejection fraction was in the range  of 45% to 50%.  - Left atrium: The atrium was mildly dilated.  - Atrial septum: No defect or patent foramen ovale was  identified.  - Pericardium, extracardiac: A trivial pericardial effusion  was identified.   NST: 08/2020  Transient episode of AV block after Lexiscan injection. Subsequent rhythm normal without diagnostic ST segment changes.  Medium sized, mild intensity, partially reversible inferior defect from apex to base suggesting scar with minor inferoseptal ischemia.  This is a low risk study.  Nuclear stress EF: 52%.  Assessment:    1. Coronary artery  disease involving native  coronary artery of native heart with other form of angina pectoris ([place])   2. Hyperlipidemia LDL goal <70   3. Essential hypertension   4. Stage 3 chronic kidney disease, unspecified whether stage 3a or 3b CKD ([place])      Plan:   In order of problems listed above:  1. CAD - He is s/p DES to RCA in 2014 and his recent stress test last month showed a medium sized, mild intensity, partially reversible inferior defect from the apex to base suggesting scar with minor inferoseptal ischemia. EF was 52% which was improved from prior imaging in 2014 and the study was overall low-risk. - He reports significant improvement in his symptoms and has only experienced 1 episode of chest pain since his last visit. Will continue with medical therapy at this time as he would be at higher risk for contrast-induced nephropathy given his CKD.  - Continue ASA 81 mg daily, Zetia 10 mg daily, Imdur 15 mg daily, Toprol-XL 100 mg twice daily and Crestor 40 mg daily. He does have an Rx for updated SL NTG and carries this with him. Reduction in tobacco use advised.   2. HTN - BP is well controlled at 130/72 during today's visit and he reports similar readings when checked at home. Continue current medication regimen with Amlodipine 10 mg daily, Hydralazine 100 mg 3 times daily, Imdur 15 mg daily, Losartan 50 mg daily and Toprol-XL 100 mg twice daily.  3. HLD - Followed by PCP. Remains on Crestor 40mg  daily and Zetia 10mg  daily.   4. Stage 3 CKD - Followed by Dr. Posey Pronto. Most recent labs showed creatinine was at 1.95 in 01/2020.   Medication Adjustments/Labs and Tests Ordered: Current medicines are reviewed at length with the patient today.  Concerns regarding medicines are outlined above.  Medication changes, Labs and Tests ordered today are listed in the Patient Instructions below. Patient Instructions  Your physician wants you to follow-up in: [place] will receive a reminder letter in the mail  two months in advance. If you don't receive a letter, please call our office to schedule the follow-up appointment.  Your physician recommends that you continue on your current medications as directed. Please refer to the Current Medication list given to you today.  Thank you for choosing [facility]!!     Signed, Erma Heritage, PA-C  10/19/2020 4:32 PM    [facility] [address] [place], [place] [address] Phone: [phone] Fax: [phone]

## 2020-11-07 ENCOUNTER — Ambulatory Visit: Payer: Medicaid Other | Admitting: Cardiology

## 2020-11-28 ENCOUNTER — Telehealth: Payer: Self-pay | Admitting: Student

## 2020-11-28 NOTE — Telephone Encounter (Signed)
Wife called in regards to medication IMDUR. States that patient is fatigue wanting to sleep all the time.

## 2020-11-28 NOTE — Telephone Encounter (Signed)
I spoke with wife.I explained Imdur is a nitrate that dilates blood vessels to the heart. It it not a listed side effect of excessive sleeping, rather it may cause trouble sleeping. I suggest she speak with pcp to evaluate other causes.

## 2020-12-05 ENCOUNTER — Telehealth: Payer: Self-pay | Admitting: Student

## 2020-12-05 ENCOUNTER — Telehealth: Payer: Self-pay | Admitting: Cardiology

## 2020-12-05 NOTE — Telephone Encounter (Signed)
ERROR

## 2020-12-05 NOTE — Telephone Encounter (Signed)
I spoke with wife. They will try stopping Imdur for 7 days to see if sleepiness improves.She plans on calling me on 1/7/ with update.

## 2020-12-05 NOTE — Telephone Encounter (Signed)
Collette Demuro called requesting to speak with [place] or [place] regarding her husbands medication.

## 2020-12-06 ENCOUNTER — Telehealth: Payer: Self-pay | Admitting: Student

## 2020-12-06 NOTE — Telephone Encounter (Signed)
Colette wife- called requesting orders for patient to have blood work .States that she spoke with [place] in regards to having his iron checked.

## 2020-12-07 ENCOUNTER — Other Ambulatory Visit (HOSPITAL_COMMUNITY)
Admission: RE | Admit: 2020-12-07 | Discharge: 2020-12-07 | Disposition: A | Discharge status: Home / Self Care | Payer: Medicaid Other | Source: Ambulatory Visit | Attending: Student | Admitting: Student

## 2020-12-07 ENCOUNTER — Other Ambulatory Visit: Payer: Self-pay

## 2020-12-07 DIAGNOSIS — R5383 Other fatigue: Secondary | ICD-10-CM

## 2020-12-07 DIAGNOSIS — Z79899 Other long term (current) drug therapy: Secondary | ICD-10-CM | POA: Insufficient documentation

## 2020-12-07 LAB — CBC
HCT: 43.4 % (ref 39.0–52.0)
Hemoglobin: 14.6 g/dL (ref 13.0–17.0)
MCH: 34.4 pg — ABNORMAL HIGH (ref 26.0–34.0)
MCHC: 33.6 g/dL (ref 30.0–36.0)
MCV: 102.4 fL — ABNORMAL HIGH (ref 80.0–100.0)
Platelets: 191 10*3/uL (ref 150–400)
RBC: 4.24 MIL/uL (ref 4.22–5.81)
RDW: 12.5 % (ref 11.5–15.5)
WBC: 10 10*3/uL (ref 4.0–10.5)
nRBC: 0 % (ref 0.0–0.2)

## 2020-12-07 LAB — BASIC METABOLIC PANEL
Anion gap: 8 (ref 5–15)
BUN: 15 mg/dL (ref 6–20)
CO2: 22 mmol/L (ref 22–32)
Calcium: 8.5 mg/dL — ABNORMAL LOW (ref 8.9–10.3)
Chloride: 107 mmol/L (ref 98–111)
Creatinine, Ser: 1.96 mg/dL — ABNORMAL HIGH (ref 0.61–1.24)
GFR, Estimated: 41 mL/min — ABNORMAL LOW (ref >60)
Glucose, Bld: 171 mg/dL — ABNORMAL HIGH (ref 70–99)
Potassium: 3.9 mmol/L (ref 3.5–5.1)
Sodium: 137 mmol/L (ref 135–145)

## 2020-12-07 LAB — TSH: TSH: 4.006 u[IU]/mL (ref 0.350–4.500)

## 2020-12-07 NOTE — Telephone Encounter (Signed)
Spoke with Bernerd Pho, PA-C about lab work. [place] said to make sure patients PCP has not done lab recently and if not we could do the lab work here.

## 2020-12-12 ENCOUNTER — Telehealth: Payer: Self-pay | Admitting: Student

## 2020-12-12 NOTE — Telephone Encounter (Signed)
Please call pt's wife @ [phone] concerning pt's medication

## 2020-12-12 NOTE — Telephone Encounter (Signed)
    Given that the cause of his fatigue has been determined, I would recommend we restart Imdur 15mg  daily as he was doing well on the medication and symptoms had improved.   Signed, Erma Heritage, PA-C 12/12/2020, 6:35 PM

## 2020-12-12 NOTE — Telephone Encounter (Signed)
Spoke the wife who states that a B12 deficiency and that this is why he is tired. Wife reports that after stopping Imdur pt's chest pain has reoccurred. Please advise.

## 2020-12-13 ENCOUNTER — Other Ambulatory Visit: Payer: Self-pay | Admitting: Cardiology

## 2020-12-13 NOTE — Telephone Encounter (Signed)
I spoke with wife. Patient will re-start Imdur 15 mg daily.

## 2021-02-14 ENCOUNTER — Other Ambulatory Visit: Payer: Self-pay | Admitting: Student

## 2021-02-14 NOTE — Telephone Encounter (Signed)
This is a [place] pt, Dr. Branch 

## 2021-03-08 ENCOUNTER — Other Ambulatory Visit: Payer: Self-pay | Admitting: Cardiology

## 2021-03-13 ENCOUNTER — Inpatient Hospital Stay (HOSPITAL_COMMUNITY): Payer: Medicaid Other

## 2021-03-13 ENCOUNTER — Other Ambulatory Visit (HOSPITAL_COMMUNITY): Payer: Self-pay

## 2021-03-13 ENCOUNTER — Encounter (HOSPITAL_COMMUNITY)
Admission: EM | Disposition: A | Discharge status: Home / Self Care | Payer: Self-pay | Source: Home / Self Care | Attending: Cardiology

## 2021-03-13 ENCOUNTER — Inpatient Hospital Stay (HOSPITAL_COMMUNITY)
Admission: EM | Admit: 2021-03-13 | Discharge: 2021-03-15 | DRG: 251 | Disposition: A | Discharge status: Home / Self Care | Payer: Medicaid Other | Attending: Cardiology | Admitting: Cardiology

## 2021-03-13 ENCOUNTER — Other Ambulatory Visit: Payer: Self-pay

## 2021-03-13 ENCOUNTER — Encounter (HOSPITAL_COMMUNITY): Payer: Self-pay | Admitting: *Deleted

## 2021-03-13 ENCOUNTER — Emergency Department (HOSPITAL_COMMUNITY): Payer: Medicaid Other

## 2021-03-13 DIAGNOSIS — Z7982 Long term (current) use of aspirin: Secondary | ICD-10-CM | POA: Diagnosis not present

## 2021-03-13 DIAGNOSIS — Z87441 Personal history of nephrotic syndrome: Secondary | ICD-10-CM | POA: Diagnosis not present

## 2021-03-13 DIAGNOSIS — Z885 Allergy status to narcotic agent status: Secondary | ICD-10-CM

## 2021-03-13 DIAGNOSIS — Z9861 Coronary angioplasty status: Secondary | ICD-10-CM | POA: Diagnosis not present

## 2021-03-13 DIAGNOSIS — Z20822 Contact with and (suspected) exposure to covid-19: Secondary | ICD-10-CM | POA: Diagnosis present

## 2021-03-13 DIAGNOSIS — N184 Chronic kidney disease, stage 4 (severe): Secondary | ICD-10-CM | POA: Diagnosis present

## 2021-03-13 DIAGNOSIS — Z720 Tobacco use: Secondary | ICD-10-CM | POA: Diagnosis not present

## 2021-03-13 DIAGNOSIS — N1832 Chronic kidney disease, stage 3b: Secondary | ICD-10-CM | POA: Diagnosis not present

## 2021-03-13 DIAGNOSIS — I2510 Atherosclerotic heart disease of native coronary artery without angina pectoris: Secondary | ICD-10-CM | POA: Diagnosis not present

## 2021-03-13 DIAGNOSIS — I129 Hypertensive chronic kidney disease with stage 1 through stage 4 chronic kidney disease, or unspecified chronic kidney disease: Secondary | ICD-10-CM | POA: Diagnosis not present

## 2021-03-13 DIAGNOSIS — E785 Hyperlipidemia, unspecified: Secondary | ICD-10-CM | POA: Diagnosis not present

## 2021-03-13 DIAGNOSIS — Z886 Allergy status to analgesic agent status: Secondary | ICD-10-CM

## 2021-03-13 DIAGNOSIS — I252 Old myocardial infarction: Secondary | ICD-10-CM | POA: Diagnosis not present

## 2021-03-13 DIAGNOSIS — I10 Essential (primary) hypertension: Secondary | ICD-10-CM | POA: Diagnosis not present

## 2021-03-13 DIAGNOSIS — Z79899 Other long term (current) drug therapy: Secondary | ICD-10-CM | POA: Diagnosis not present

## 2021-03-13 DIAGNOSIS — Z6841 Body Mass Index (BMI) 40.0 and over, adult: Secondary | ICD-10-CM | POA: Diagnosis not present

## 2021-03-13 DIAGNOSIS — E1169 Type 2 diabetes mellitus with other specified complication: Secondary | ICD-10-CM | POA: Diagnosis not present

## 2021-03-13 DIAGNOSIS — Z833 Family history of diabetes mellitus: Secondary | ICD-10-CM | POA: Diagnosis not present

## 2021-03-13 DIAGNOSIS — Z880 Allergy status to penicillin: Secondary | ICD-10-CM | POA: Diagnosis not present

## 2021-03-13 DIAGNOSIS — E1122 Type 2 diabetes mellitus with diabetic chronic kidney disease: Secondary | ICD-10-CM | POA: Diagnosis present

## 2021-03-13 DIAGNOSIS — Z8249 Family history of ischemic heart disease and other diseases of the circulatory system: Secondary | ICD-10-CM

## 2021-03-13 DIAGNOSIS — I2119 ST elevation (STEMI) myocardial infarction involving other coronary artery of inferior wall: Secondary | ICD-10-CM

## 2021-03-13 DIAGNOSIS — I2111 ST elevation (STEMI) myocardial infarction involving right coronary artery: Secondary | ICD-10-CM

## 2021-03-13 DIAGNOSIS — F17210 Nicotine dependence, cigarettes, uncomplicated: Secondary | ICD-10-CM | POA: Diagnosis present

## 2021-03-13 DIAGNOSIS — E876 Hypokalemia: Secondary | ICD-10-CM | POA: Diagnosis not present

## 2021-03-13 DIAGNOSIS — Z955 Presence of coronary angioplasty implant and graft: Secondary | ICD-10-CM | POA: Diagnosis not present

## 2021-03-13 DIAGNOSIS — Z716 Tobacco abuse counseling: Secondary | ICD-10-CM

## 2021-03-13 DIAGNOSIS — I200 Unstable angina: Secondary | ICD-10-CM

## 2021-03-13 HISTORY — PX: CORONARY/GRAFT ACUTE MI REVASCULARIZATION: CATH118305

## 2021-03-13 HISTORY — DX: Type 2 diabetes mellitus with other specified complication: E11.69

## 2021-03-13 HISTORY — PX: LEFT HEART CATH AND CORONARY ANGIOGRAPHY: CATH118249

## 2021-03-13 HISTORY — DX: Umbilical hernia without obstruction or gangrene: K42.9

## 2021-03-13 LAB — CBC
HCT: 39.9 % (ref 39.0–52.0)
Hemoglobin: 13.4 g/dL (ref 13.0–17.0)
MCH: 33.3 pg (ref 26.0–34.0)
MCHC: 33.6 g/dL (ref 30.0–36.0)
MCV: 99.3 fL (ref 80.0–100.0)
Platelets: 178 10*3/uL (ref 150–400)
RBC: 4.02 MIL/uL — ABNORMAL LOW (ref 4.22–5.81)
RDW: 12.8 % (ref 11.5–15.5)
WBC: 7.8 10*3/uL (ref 4.0–10.5)
nRBC: 0 % (ref 0.0–0.2)

## 2021-03-13 LAB — COMPREHENSIVE METABOLIC PANEL
ALT: 30 U/L (ref 0–44)
AST: 30 U/L (ref 15–41)
Albumin: 3.3 g/dL — ABNORMAL LOW (ref 3.5–5.0)
Alkaline Phosphatase: 60 U/L (ref 38–126)
Anion gap: 8 (ref 5–15)
BUN: 18 mg/dL (ref 6–20)
CO2: 23 mmol/L (ref 22–32)
Calcium: 9.4 mg/dL (ref 8.9–10.3)
Chloride: 103 mmol/L (ref 98–111)
Creatinine, Ser: 2.09 mg/dL — ABNORMAL HIGH (ref 0.61–1.24)
GFR, Estimated: 38 mL/min — ABNORMAL LOW (ref >60)
Glucose, Bld: 157 mg/dL — ABNORMAL HIGH (ref 70–99)
Potassium: 3.4 mmol/L — ABNORMAL LOW (ref 3.5–5.1)
Sodium: 134 mmol/L — ABNORMAL LOW (ref 135–145)
Total Bilirubin: 0.6 mg/dL (ref 0.3–1.2)
Total Protein: 6.4 g/dL — ABNORMAL LOW (ref 6.5–8.1)

## 2021-03-13 LAB — HEMOGLOBIN A1C
Hgb A1c MFr Bld: 6.3 % — ABNORMAL HIGH (ref 4.8–5.6)
Mean Plasma Glucose: 134.11 mg/dL

## 2021-03-13 LAB — POCT ACTIVATED CLOTTING TIME
Activated Clotting Time: 178 seconds
Activated Clotting Time: 231 seconds
Activated Clotting Time: 255 seconds
Activated Clotting Time: 440 seconds

## 2021-03-13 LAB — TROPONIN I (HIGH SENSITIVITY)
Troponin I (High Sensitivity): 630 ng/L (ref <18)
Troponin I (High Sensitivity): 766 ng/L (ref <18)
Troponin I (High Sensitivity): 1179 ng/L (ref <18)
Troponin I (High Sensitivity): 4679 ng/L (ref <18)

## 2021-03-13 LAB — LIPID PANEL
Cholesterol: 138 mg/dL (ref 0–200)
HDL: 29 mg/dL — ABNORMAL LOW (ref >40)
LDL Cholesterol: 65 mg/dL (ref 0–99)
Total CHOL/HDL Ratio: 4.8 RATIO
Triglycerides: 219 mg/dL — ABNORMAL HIGH (ref <150)
VLDL: 44 mg/dL — ABNORMAL HIGH (ref 0–40)

## 2021-03-13 LAB — ECHOCARDIOGRAM COMPLETE
Area-P 1/2: 2.56 cm2
Height: 75.5 in
S' Lateral: 4 cm
Weight: 5280 oz

## 2021-03-13 LAB — RESP PANEL BY RT-PCR (FLU A&B, COVID) ARPGX2
Influenza A by PCR: NEGATIVE
Influenza B by PCR: NEGATIVE
SARS Coronavirus 2 by RT PCR: NEGATIVE

## 2021-03-13 LAB — APTT: aPTT: 52 seconds — ABNORMAL HIGH (ref 24–36)

## 2021-03-13 LAB — PROTIME-INR
INR: 0.9 (ref 0.8–1.2)
Prothrombin Time: 12.3 seconds (ref 11.4–15.2)

## 2021-03-13 SURGERY — LEFT HEART CATH AND CORONARY ANGIOGRAPHY
Anesthesia: LOCAL

## 2021-03-13 MED ORDER — HEPARIN SODIUM (PORCINE) 5000 UNIT/ML IJ SOLN
60.0000 [IU]/kg | Freq: Once | INTRAMUSCULAR | Status: AC
  Administered 2021-03-13: 9000 [IU] via INTRAVENOUS
  Filled 2021-03-13: qty 2

## 2021-03-13 MED ORDER — ALPRAZOLAM 0.5 MG PO TABS
1.0000 mg | ORAL_TABLET | Freq: Three times a day (TID) | ORAL | Status: DC | PRN
  Administered 2021-03-13 – 2021-03-14 (×3): 1 mg via ORAL
  Filled 2021-03-13 (×3): qty 2

## 2021-03-13 MED ORDER — LABETALOL HCL 5 MG/ML IV SOLN: 10.0000 mg | INTRAVENOUS | Status: AC | PRN

## 2021-03-13 MED ORDER — LIDOCAINE HCL (PF) 1 % IJ SOLN
INTRAMUSCULAR | Status: AC
  Filled 2021-03-13: qty 30

## 2021-03-13 MED ORDER — HEPARIN SODIUM (PORCINE) 1000 UNIT/ML IJ SOLN
INTRAMUSCULAR | Status: AC
  Filled 2021-03-13: qty 1

## 2021-03-13 MED ORDER — CHLORHEXIDINE GLUCONATE CLOTH 2 % EX PADS: 6.0000 | MEDICATED_PAD | Freq: Every day | CUTANEOUS | Status: DC

## 2021-03-13 MED ORDER — HEPARIN (PORCINE) IN NACL 1000-0.9 UT/500ML-% IV SOLN
INTRAVENOUS | Status: AC
  Filled 2021-03-13: qty 500

## 2021-03-13 MED ORDER — METOPROLOL SUCCINATE ER 50 MG PO TB24
100.0000 mg | ORAL_TABLET | Freq: Every day | ORAL | Status: DC
  Administered 2021-03-14 – 2021-03-15 (×2): 100 mg via ORAL
  Filled 2021-03-13 (×2): qty 2

## 2021-03-13 MED ORDER — HEPARIN (PORCINE) IN NACL 1000-0.9 UT/500ML-% IV SOLN
INTRAVENOUS | Status: AC
  Filled 2021-03-13: qty 1000

## 2021-03-13 MED ORDER — SODIUM CHLORIDE 0.9% FLUSH
3.0000 mL | Freq: Two times a day (BID) | INTRAVENOUS | Status: DC
  Administered 2021-03-13 – 2021-03-15 (×5): 3 mL via INTRAVENOUS

## 2021-03-13 MED ORDER — IOHEXOL 350 MG/ML SOLN
INTRAVENOUS | Status: DC | PRN
  Administered 2021-03-13: 200 mL

## 2021-03-13 MED ORDER — HEPARIN (PORCINE) IN NACL 1000-0.9 UT/500ML-% IV SOLN
INTRAVENOUS | Status: DC | PRN
  Administered 2021-03-13 (×3): 500 mL

## 2021-03-13 MED ORDER — SODIUM CHLORIDE 0.9 % IV SOLN: INTRAVENOUS | Status: AC

## 2021-03-13 MED ORDER — SODIUM CHLORIDE 0.9 % IV SOLN: INTRAVENOUS | Status: DC

## 2021-03-13 MED ORDER — TICAGRELOR 90 MG PO TABS
90.0000 mg | ORAL_TABLET | Freq: Two times a day (BID) | ORAL | Status: DC
  Administered 2021-03-13 – 2021-03-15 (×4): 90 mg via ORAL
  Filled 2021-03-13 (×4): qty 1

## 2021-03-13 MED ORDER — HYDRALAZINE HCL 20 MG/ML IJ SOLN: 10.0000 mg | INTRAMUSCULAR | Status: AC | PRN

## 2021-03-13 MED ORDER — VERAPAMIL HCL 2.5 MG/ML IV SOLN
INTRAVENOUS | Status: DC | PRN
  Administered 2021-03-13: 10 mL via INTRA_ARTERIAL

## 2021-03-13 MED ORDER — IOHEXOL 350 MG/ML SOLN
INTRAVENOUS | Status: AC
  Filled 2021-03-13: qty 1

## 2021-03-13 MED ORDER — SODIUM CHLORIDE 0.9 % IV SOLN
INTRAVENOUS | Status: AC | PRN
  Administered 2021-03-13: 30 mL/h via INTRAVENOUS

## 2021-03-13 MED ORDER — ACETAMINOPHEN 325 MG PO TABS: 650.0000 mg | ORAL_TABLET | ORAL | Status: DC | PRN

## 2021-03-13 MED ORDER — NITROGLYCERIN 0.4 MG SL SUBL: 0.4000 mg | SUBLINGUAL_TABLET | SUBLINGUAL | Status: DC | PRN

## 2021-03-13 MED ORDER — FENTANYL CITRATE (PF) 100 MCG/2ML IJ SOLN
INTRAMUSCULAR | Status: AC
  Filled 2021-03-13: qty 2

## 2021-03-13 MED ORDER — HEPARIN SODIUM (PORCINE) 1000 UNIT/ML IJ SOLN
INTRAMUSCULAR | Status: DC | PRN
  Administered 2021-03-13: 4000 [IU] via INTRAVENOUS
  Administered 2021-03-13: 6000 [IU] via INTRAVENOUS
  Administered 2021-03-13: 3000 [IU] via INTRAVENOUS

## 2021-03-13 MED ORDER — ROSUVASTATIN CALCIUM 20 MG PO TABS
40.0000 mg | ORAL_TABLET | Freq: Every day | ORAL | Status: DC
  Administered 2021-03-14 – 2021-03-15 (×2): 40 mg via ORAL
  Filled 2021-03-13 (×3): qty 2

## 2021-03-13 MED ORDER — POTASSIUM CHLORIDE CRYS ER 20 MEQ PO TBCR
20.0000 meq | EXTENDED_RELEASE_TABLET | Freq: Once | ORAL | Status: AC
  Administered 2021-03-13: 20 meq via ORAL
  Filled 2021-03-13: qty 1

## 2021-03-13 MED ORDER — TICAGRELOR 90 MG PO TABS
ORAL_TABLET | ORAL | Status: DC | PRN
  Administered 2021-03-13: 180 mg via ORAL

## 2021-03-13 MED ORDER — SODIUM CHLORIDE 0.9 % IV SOLN: 250.0000 mL | INTRAVENOUS | Status: DC | PRN

## 2021-03-13 MED ORDER — TIROFIBAN HCL IN NACL 5-0.9 MG/100ML-% IV SOLN
INTRAVENOUS | Status: AC
  Filled 2021-03-13: qty 100

## 2021-03-13 MED ORDER — ONDANSETRON HCL 4 MG/2ML IJ SOLN: 4.0000 mg | Freq: Four times a day (QID) | INTRAMUSCULAR | Status: DC | PRN

## 2021-03-13 MED ORDER — HYDROCODONE-ACETAMINOPHEN 5-325 MG PO TABS
1.0000 | ORAL_TABLET | Freq: Four times a day (QID) | ORAL | Status: DC | PRN
  Administered 2021-03-13 – 2021-03-14 (×2): 1 via ORAL
  Filled 2021-03-13 (×2): qty 1

## 2021-03-13 MED ORDER — TIROFIBAN (AGGRASTAT) BOLUS VIA INFUSION
INTRAVENOUS | Status: DC | PRN
  Administered 2021-03-13: 3742.5 ug via INTRAVENOUS

## 2021-03-13 MED ORDER — SODIUM CHLORIDE 0.9% FLUSH: 3.0000 mL | INTRAVENOUS | Status: DC | PRN

## 2021-03-13 MED ORDER — LOSARTAN POTASSIUM 50 MG PO TABS
50.0000 mg | ORAL_TABLET | Freq: Every day | ORAL | Status: DC
  Administered 2021-03-14 – 2021-03-15 (×2): 50 mg via ORAL
  Filled 2021-03-13 (×2): qty 1

## 2021-03-13 MED ORDER — ASPIRIN EC 81 MG PO TBEC
81.0000 mg | DELAYED_RELEASE_TABLET | Freq: Every day | ORAL | Status: DC
  Administered 2021-03-14 – 2021-03-15 (×2): 81 mg via ORAL
  Filled 2021-03-13 (×2): qty 1

## 2021-03-13 MED ORDER — ASPIRIN 81 MG PO CHEW
324.0000 mg | CHEWABLE_TABLET | Freq: Once | ORAL | Status: AC
  Administered 2021-03-13: 324 mg via ORAL
  Filled 2021-03-13: qty 4

## 2021-03-13 MED ORDER — HYDROMORPHONE HCL 1 MG/ML IJ SOLN
1.0000 mg | Freq: Once | INTRAMUSCULAR | Status: DC
  Filled 2021-03-13: qty 1

## 2021-03-13 MED ORDER — VERAPAMIL HCL 2.5 MG/ML IV SOLN
INTRAVENOUS | Status: AC
  Filled 2021-03-13: qty 2

## 2021-03-13 MED ORDER — ALBUTEROL SULFATE (2.5 MG/3ML) 0.083% IN NEBU: 3.0000 mL | INHALATION_SOLUTION | Freq: Four times a day (QID) | RESPIRATORY_TRACT | Status: DC | PRN

## 2021-03-13 MED ORDER — FENTANYL CITRATE (PF) 100 MCG/2ML IJ SOLN
INTRAMUSCULAR | Status: DC | PRN
  Administered 2021-03-13: 50 ug via INTRAVENOUS

## 2021-03-13 MED ORDER — TIROFIBAN HCL IN NACL 5-0.9 MG/100ML-% IV SOLN
INTRAVENOUS | Status: AC | PRN
  Administered 2021-03-13: 0.15 ug/kg/min via INTRAVENOUS

## 2021-03-13 MED ORDER — HYDRALAZINE HCL 50 MG PO TABS
100.0000 mg | ORAL_TABLET | Freq: Three times a day (TID) | ORAL | Status: DC
  Administered 2021-03-13 – 2021-03-14 (×5): 100 mg via ORAL
  Filled 2021-03-13 (×5): qty 2

## 2021-03-13 MED ORDER — ACETAMINOPHEN 500 MG PO TABS: 1000.0000 mg | ORAL_TABLET | Freq: Four times a day (QID) | ORAL | Status: DC | PRN

## 2021-03-13 MED ORDER — NITROGLYCERIN 1 MG/10 ML FOR IR/CATH LAB
INTRA_ARTERIAL | Status: DC | PRN
  Administered 2021-03-13 (×2): 200 ug via INTRACORONARY

## 2021-03-13 MED ORDER — EZETIMIBE 10 MG PO TABS
10.0000 mg | ORAL_TABLET | Freq: Every day | ORAL | Status: DC
  Administered 2021-03-14 – 2021-03-15 (×2): 10 mg via ORAL
  Filled 2021-03-13 (×2): qty 1

## 2021-03-13 MED ORDER — SODIUM CHLORIDE 0.9 % IV BOLUS
INTRAVENOUS | Status: AC | PRN
  Administered 2021-03-13: 250 mL/h via INTRAVENOUS

## 2021-03-13 MED ORDER — LIDOCAINE HCL (PF) 1 % IJ SOLN
INTRAMUSCULAR | Status: DC | PRN
  Administered 2021-03-13: 2 mL

## 2021-03-13 MED ORDER — AMLODIPINE BESYLATE 10 MG PO TABS
10.0000 mg | ORAL_TABLET | Freq: Every day | ORAL | Status: DC
  Administered 2021-03-14 – 2021-03-15 (×2): 10 mg via ORAL
  Filled 2021-03-13 (×2): qty 1

## 2021-03-13 MED ORDER — TIROFIBAN HCL IN NACL 5-0.9 MG/100ML-% IV SOLN
0.1500 ug/kg/min | INTRAVENOUS | Status: AC
  Administered 2021-03-13: 0.15 ug/kg/min via INTRAVENOUS
  Filled 2021-03-13: qty 100

## 2021-03-13 MED ORDER — ACETAMINOPHEN 325 MG PO TABS
325.0000 mg | ORAL_TABLET | Freq: Four times a day (QID) | ORAL | Status: DC | PRN
  Administered 2021-03-13 – 2021-03-14 (×2): 325 mg via ORAL
  Filled 2021-03-13 (×2): qty 1

## 2021-03-13 SURGICAL SUPPLY — 21 items
BALLN SAPPHIRE 2.0X12 (BALLOONS) ×2
BALLN SAPPHIRE 2.5X12 (BALLOONS) ×2
BALLOON SAPPHIRE 2.0X12 (BALLOONS) IMPLANT
BALLOON SAPPHIRE 2.5X12 (BALLOONS) IMPLANT
CATH LAUNCHER 6FR AL.75 (CATHETERS) ×1 IMPLANT
CATH LAUNCHER 6FR EBU3.5 (CATHETERS) ×1 IMPLANT
CATH OPTITORQUE TIG 4.0 5F (CATHETERS) ×1 IMPLANT
CATH VISTA GUIDE 6FR AR2 (CATHETERS) ×1 IMPLANT
CATH VISTA GUIDE 6FR JR4 (CATHETERS) ×1 IMPLANT
DEVICE RAD COMP TR BAND LRG (VASCULAR PRODUCTS) ×1 IMPLANT
GLIDESHEATH SLEND SS 6F .021 (SHEATH) ×1 IMPLANT
GUIDEWIRE INQWIRE 1.5J.035X260 (WIRE) IMPLANT
INQWIRE 1.5J .035X260CM (WIRE) ×2
KIT ENCORE 26 ADVANTAGE (KITS) ×1 IMPLANT
KIT HEART LEFT (KITS) ×2 IMPLANT
MAT PREVALON FULL STRYKER (MISCELLANEOUS) ×1 IMPLANT
PACK CARDIAC CATHETERIZATION (CUSTOM PROCEDURE TRAY) ×2 IMPLANT
TRANSDUCER W/STOPCOCK (MISCELLANEOUS) ×2 IMPLANT
TUBING CIL FLEX 10 FLL-RA (TUBING) ×2 IMPLANT
WIRE ASAHI PROWATER 180CM (WIRE) ×1 IMPLANT
WIRE RUNTHROUGH .014X180CM (WIRE) ×1 IMPLANT

## 2021-03-13 NOTE — ED Notes (Signed)
Date and time results received: 03/13/21 9:19 AM  Test: troponin Critical Value: 630  Name of Provider Notified: steinl, md notified, cath lab notified  Orders Received? Or Actions Taken?: cath lab notified, steinl notified

## 2021-03-13 NOTE — Brief Op Note (Signed)
BRIEF CARDIAC CATH-PTCA NOTE. Report 03/13/2021  11:46 AM    SURGEON:  Surgeon(s) and Role:    * Leonie Man, MD - Primary   PATIENT:  Wayne Spencer  49 y.o. male  with history of overlapping DES stents to the RCA in 2014 and stable non-STEMI.  The stents were 4 oh stents postdilated to 4.5 mm.  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  He had been relatively well but apparently has been having some chest discomfort over the last week, but was taking Tums.  Last night he took 6 times before is able to sleep after an episode of chest pain.  That lasted about an hour.  He did okay until this morning he awoke again has the same pain that did not go away with Tums.  He therefore called EMS.  EKG upon arrival to [facility] showed subtle 1 mm ST elevations in inferior leads and code STEMI was called.  He was transferred urgently to [facility] for catheterization.  Upon arrival he was having intermittent chest pain of 3-8/10, but did indicate he was feeling better.  EKG was also showing signs of improvement with less ST elevation.   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  Poor visualization due to body habitus.  Therefore chose to do PTCA only and allow for completion of the infarct-related artery closure.  Diffuse mild to moderate disease in the LAD with most prominent lesion being 50 %   lesion in the ostial D2.  Normal LVEDP.  PROCEDURE:  Procedure(s): LEFT HEART CATH AND CORONARY ANGIOGRAPHY (N/A) Coronary/Graft Acute MI Revascularization (N/A)  Time Out: Verified patient identification, verified procedure, site/side was marked,  verified correct patient position, special equipment/implants available, medications/allergies/relevent history reviewed, required imaging and test results available. Performed.  Access:  . RIGHT Radial Artery: 6 Fr sheath -- Seldinger technique using Micropuncture Kit -- Direct ultrasound guidance used.  Permanent image obtained and placed on chart. -- 10 mL radial cocktail IA; 6000 Units IV Heparin  Left Heart Catheterization: 5 &6Fr Catheters advanced or exchanged over a J-wire under direct fluoroscopic guidance into the ascending aorta; TIG 4.0 catheter advanced first.  . * LV Hemodynamics (LV Gram): JR4 Guide Catheter . * Left Coronary Artery Cineangiography: TIG 4.0 Catheter  . * Right Coronary Artery Cineangiography: JR4 Catheter   Review of initial angiography revealed: What appears to be occlusion of RPL 2.  This was only noted with the assistance of previous films.  Preparations are made for attempted PTCA of PL 2 and potentially PL 3.  PTCA eventually of PL 3 performed-unable to wire PL 2.  After PTCA only, procedure aborted  Upon completion of Angiogaphy, the catheter was removed completely out of the body over a wire, without complication.  Radial sheath removed in the Cardiac Catheterization lab with TR Band placed for hemostasis.  TR Band: 1105  Hours; 15 mL air  MEDICATIONS . SQ Lidocaine 5 mL . Radial Cocktail: 3 mg Verapmil in 10 mL NS . Heparin: 13,000 units in addition to 9000 units given in the ER at any plan . IC NTG 200 mcg x  3   ANESTHESIA:   local and IV sedation  EBL:  <50 mL   DICTATION: .Note written in [place]: Admit to inpatient   PATIENT DISPOSITION:  PACU - hemodynamically stable.   RECOMMENDATIONS:  Overnight in CCU-we will do 3 hours IV Aggrastat to help distal flow.  Check 2D echo in the morning; cycle troponins  [facility] consult for the morning.  Has been loaded with Brilinta, continue aspirin and Brilinta for ACS x1 year.   (Would likely be able to hold after 6 months)  Continue aggressive risk factor modification, has multiple medications already listed.  Will adjust as BP and labs tolerate.  Expect if he remains comfortable in the next day or so he can probably discharge home after 48 hours.    Glenetta Hew, MD  Delay start of Pharmacological VTE agent (>24hrs) due to surgical blood loss or risk of bleeding: not applicable

## 2021-03-13 NOTE — ED Provider Notes (Signed)
9:35 AM Patient seen on arrival as a transfer from our affiliated hospital after initially presenting with chest pain, and being designated a code STEMI.  He is awake and alert, knows his pain is improved somewhat, he has received aspirin, heparin. I reviewed his presentation with our cardiology coordinator at bedside.  Patient was soon thereafter taken to the catheterization lab.   Carmin Muskrat, MD 03/13/21 [phone]

## 2021-03-13 NOTE — ED Triage Notes (Signed)
Pt c/o left chest pain that started last night but was relieved by Tums. It started again this morning and he took 2 Nitro without relief. Slight SOB, nausea and dizziness. Hx of MI in 2014 with 1 stent placed.

## 2021-03-13 NOTE — ED Provider Notes (Signed)
[facility] EMERGENCY DEPARTMENT Provider Note   CSN: [id number] Arrival date & time: 03/13/21  [id number]     History Chief Complaint  Patient presents with  . Chest Pain    Wayne Spencer is a 50 y.o. male.  Patient w hx cad, stent 2014, c/o mid to left cp radiating towards left shoulder/arm, last pm - took many tums, eventually eased off, returned early this AM around 6, constant, dull, moderate, not pleuritic. Similar to prior cardiac cp. Indicates has been getting some similar, milder, briefer, pain in the past few weeks. Denies fever/chills. No sore throat. Rare non prod cough. +smoker. No leg pain or swelling. No back, neck or flank pain. No associated nv. +mild diaphoresis.   The history is provided by the patient and the spouse.  Chest Pain Associated symptoms: no abdominal pain, no back pain, no fever, no headache, no nausea, no palpitations, no shortness of breath and no vomiting        Past Medical History:  Diagnosis Date  . CAD (coronary artery disease)    a. s/p DES to RCA in 2014  . Cellulitis   . Chronic headache   . Hyperlipidemia   . Hypertension   . Nephrotic syndrome   . Renal insufficiency   . Tobacco abuse   . Umbilical hernia     Patient Active Problem List   Diagnosis Date Noted  . NSTEMI (non-ST elevated myocardial infarction) ([place]) 09/05/2013  . CKD (chronic kidney disease) stage 4, GFR 15-29 ml/min (HCC) 08/11/2013  . Hypokalemia 08/11/2013  . Tobacco abuse   . Hyperlipidemia   . Nephrotic syndrome   . Hypertension   . CAD (coronary artery disease)   . SCIATICA 10/30/2008  . BACK PAIN 10/30/2008    Past Surgical History:  Procedure Laterality Date  . CORONARY STENT PLACEMENT    . LEFT HEART CATHETERIZATION WITH CORONARY ANGIOGRAM N/A 08/10/2013   Procedure: LEFT HEART CATHETERIZATION WITH CORONARY ANGIOGRAM;  Surgeon: [name], MD;  Location: [facility] CATH LAB;  Service: Cardiovascular;  Laterality: N/A;  . PERCUTANEOUS CORONARY STENT  INTERVENTION (PCI-S) N/A 08/11/2013   Procedure: PERCUTANEOUS CORONARY STENT INTERVENTION (PCI-S);  Surgeon: Blane Ohara, MD;  Location: [facility] CATH LAB;  Service: Cardiovascular;  Laterality: N/A;       Family History  Problem Relation Age of Onset  . Heart attack Father        70s  . Heart attack Brother        56s, s/p CABG, passed from MI at 58  . Heart attack Sister        60s  . CAD Mother   . Kidney failure Sister   . Diabetes Brother   . CAD Brother     Social History   Tobacco Use  . Smoking status: Current Every Day Smoker    Packs/day: 2.00    Years: 6.00    Pack years: 12.00    Types: Cigarettes    Start date: 02/19/1985  . Smokeless tobacco: Never Used  Vaping Use  . Vaping Use: Never used  Substance Use Topics  . Alcohol use: Not Currently    Alcohol/week: 0.0 standard drinks    Comment: occasional  . Drug use: No    Home Medications Prior to Admission medications   Medication Sig Start Date End Date Taking? Authorizing Provider  acetaminophen (TYLENOL) 500 MG tablet Take 1,000 mg by mouth every 6 (six) hours as needed for headache.    [provider]  albuterol (PROVENTIL HFA;VENTOLIN HFA) 108 (90 Base) MCG/ACT inhaler Inhale 1-2 puffs into the lungs every 6 (six) hours as needed for wheezing or shortness of breath.    [provider]  ALPRAZolam Duanne Moron) 1 MG tablet Take 1 mg by mouth 3 (three) times daily as needed for anxiety.    [provider]  amLODipine (NORVASC) 10 MG tablet Take 1 tablet (10 mg total) by mouth daily. 03/15/20 10/19/20  Strader, Fransisco Hertz, PA-C  aspirin EC 81 MG EC tablet Take 1 tablet (81 mg total) by mouth daily. 08/12/13   Barrett, Evelene Croon, PA-C  cyclobenzaprine (FLEXERIL) 10 MG tablet Take 10 mg by mouth 3 (three) times daily as needed for muscle spasms.    [provider]  ezetimibe (ZETIA) 10 MG tablet Take 1 tablet (10 mg total) by mouth daily. 03/15/20 10/19/20  Strader, Fransisco Hertz, PA-C   furosemide (LASIX) 40 MG tablet TAKE 40 MG DAILY. MAY TAKE AN ADDITIONAL 20 MG AS NEEDED FOR SEVERE LEG SWELLING. 09/07/18   Arnoldo Lenis, MD  hydrALAZINE (APRESOLINE) 100 MG tablet TAKE 1 TABLET BY MOUTH THREE TIMES DAILY. 03/08/21   Arnoldo Lenis, MD  hydrALAZINE (APRESOLINE) 50 MG tablet Take 100 mg by mouth 3 (three) times daily. Recently increased by Dr. Elmarie Shiley (kidney MD).    [provider]  HYDROcodone-acetaminophen (NORCO) 7.5-325 MG per tablet Take 1 tablet by mouth every 4 (four) hours as needed for pain.    [provider]  isosorbide mononitrate (IMDUR) 30 MG 24 hr tablet Take 15 mg by mouth daily.    [provider]  losartan (COZAAR) 50 MG tablet Take 1 tablet (50 mg total) by mouth daily. 03/15/20 09/12/20  Strader, Fransisco Hertz, PA-C  metoprolol succinate (TOPROL-XL) 100 MG 24 hr tablet TAKE (1) TABLET BY MOUTH IN THE MORNING TAKE (1) TABLET AT BEDTIME TAKE WITH OR IMMEDIATELY FOLLOWING A MEAL. 12/13/20   Arnoldo Lenis, MD  nitroGLYCERIN (NITROSTAT) 0.4 MG SL tablet Place 1 tablet (0.4 mg total) under the tongue every 5 (five) minutes as needed for chest pain. 09/18/14   Lendon Colonel, NP  potassium chloride SA (KLOR-CON) 20 MEQ tablet TAKE (1) TABLET BY MOUTH TWICE DAILY. 02/14/21   Strader, Fransisco Hertz, PA-C  rosuvastatin (CRESTOR) 40 MG tablet Take 40 mg by mouth daily.    [provider]    Allergies    Mobic [meloxicam], Penicillins, and Tramadol  Review of Systems   Review of Systems  Constitutional: Negative for fever.  HENT: Negative for sore throat.   Eyes: Negative for redness.  Respiratory: Negative for shortness of breath.   Cardiovascular: Positive for chest pain. Negative for palpitations and leg swelling.  Gastrointestinal: Negative for abdominal pain, nausea and vomiting.  Genitourinary: Negative for flank pain.  Musculoskeletal: Negative for back pain and neck pain.  Skin: Negative for rash.   Neurological: Negative for headaches.  Hematological: Does not bruise/bleed easily.  Psychiatric/Behavioral: Negative for confusion.    Physical Exam Updated Vital Signs Ht 1.918 m (6' 3.5")   Wt (!) 149.7 kg   BMI 40.70 kg/m   Physical Exam Vitals and nursing note reviewed.  Constitutional:      Appearance: Normal appearance. He is well-developed.  HENT:     Head: Atraumatic.     Nose: Nose normal.     Mouth/Throat:     Mouth: Mucous membranes are moist.     Pharynx: Oropharynx is clear.  Eyes:  General: No scleral icterus.    Conjunctiva/sclera: Conjunctivae normal.  Neck:     Trachea: No tracheal deviation.  Cardiovascular:     Rate and Rhythm: Normal rate and regular rhythm.     Pulses: Normal pulses.     Heart sounds: Normal heart sounds. No murmur heard. No friction rub. No gallop.   Pulmonary:     Effort: Pulmonary effort is normal. No accessory muscle usage or respiratory distress.     Breath sounds: Normal breath sounds.  Chest:     Chest wall: No tenderness.  Abdominal:     General: Bowel sounds are normal. There is no distension.     Palpations: Abdomen is soft.     Tenderness: There is no abdominal tenderness. There is no guarding.  Genitourinary:    Comments: No cva tenderness. Musculoskeletal:        General: No swelling or tenderness.     Cervical back: Normal range of motion and neck supple. No rigidity.  Skin:    General: Skin is warm and dry.     Findings: No rash.  Neurological:     Mental Status: He is alert.     Comments: Alert, speech clear.   Psychiatric:        Mood and Affect: Mood normal.     ED Results / Procedures / Treatments   Labs (all labs ordered are listed, but only abnormal results are displayed) Results for orders placed or performed during the hospital encounter of 12/07/20  TSH  Result Value Ref Range   TSH 4.006 0.350 - 4.500 uIU/mL  Basic Metabolic Panel (BMET)  Result Value Ref Range   Sodium 137 135 - 145  mmol/L   Potassium 3.9 3.5 - 5.1 mmol/L   Chloride 107 98 - 111 mmol/L   CO2 22 22 - 32 mmol/L   Glucose, Bld 171 (H) 70 - 99 mg/dL   BUN 15 6 - 20 mg/dL   Creatinine, Ser 1.96 (H) 0.61 - 1.24 mg/dL   Calcium 8.5 (L) 8.9 - 10.3 mg/dL   GFR, Estimated 41 (L) >60 mL/min   Anion gap 8 5 - 15  CBC  Result Value Ref Range   WBC 10.0 4.0 - 10.5 K/uL   RBC 4.24 4.22 - 5.81 MIL/uL   Hemoglobin 14.6 13.0 - 17.0 g/dL   HCT 43.4 39.0 - 52.0 %   MCV 102.4 (H) 80.0 - 100.0 fL   MCH 34.4 (H) 26.0 - 34.0 pg   MCHC 33.6 30.0 - 36.0 g/dL   RDW 12.5 11.5 - 15.5 %   Platelets 191 150 - 400 K/uL   nRBC 0.0 0.0 - 0.2 %    ED ECG REPORT   Date: 03/15/2021  Rate: 67  Rhythm: normal sinus rhythm  QRS Axis: normal  Intervals: normal  ST/T Wave abnormalities: ST elevations inferiorly STEMI  Conduction Disutrbances:none  Narrative Interpretation:   Old EKG Reviewed: changes noted  I have personally reviewed the EKG tracing  Radiology CARDIAC CATHETERIZATION  Result Date: [id number]  LV end diastolic pressure is mildly elevated.  There is no aortic valve stenosis.  -----------------------------------------------------------  RPAV lesion is 60% stenosed with 100% stenosed (culprit lesion) side branch in 2nd RPL.  Balloon angioplasty was performed on the follow-up on the vessel into PL 3 to restore flow) using a BALLOON SAPPHIRE 2.0X12.  Post intervention, there is a 30% residual stenosis leading into PL 3. Post intervention, the side branch remained 100% stenosed  Mid RCA to  Dist RCA stent is 5% stenosed.  Ost RCA lesion is 30% stenosed.  ------------------------------------------------------------  Prox LAD lesion is 45% stenosed with 30% stenosed side branch in 1st Diag.  Mid LAD lesion is 30% stenosed with 50% stenosed side branch in 2nd Diag.  Ost Cx to Prox Cx lesion is 25% stenosed.  SUMMARY  Single-vessel disease with widely patent stent in the distal RCA, culprit lesion is 100% flush  occlusion of RPL 2 (unable to engage with wire), 80% stenosis of follow-on RPA V into RPL 3 (successful balloon PTCA only) =>  very difficult vessel to wire, very tortuous and extremely distal.  Poor visualization due to body habitus.  Therefore chose to do PTCA only and allow for completion of the infarct-related artery closure.  Due to the patient's renal insufficiency and very large patulous vessels requiring  large volumes of contrast to fill and requirement for frequent angiography, chose to stop at this point.  Diffuse mild to moderate disease in the LAD with most prominent lesion being 50 % lesion in the ostial D2. RECOMMENDATIONS:  Overnight in CCU-we will do 3 hours IV Aggrastat to help distal flow.  Check 2D echo in the morning; cycle troponins  [facility] consult for the morning.  Has been loaded with Brilinta, continue aspirin and Brilinta for ACS x1 year.  (Would likely be able to hold after 6 months)  Continue aggressive risk factor modification, has multiple medications already listed.  Will adjust as BP and labs tolerate.  Expect if he remains comfortable in the next day or so he can probably discharge home after 48 hours. Glenetta Hew, MD  ECHOCARDIOGRAM COMPLETE  Result Date: 03/13/2021    ECHOCARDIOGRAM REPORT   Patient Name:   Wayne Spencer Date of Exam: 03/13/2021 Medical Rec #:  [id number]      Height:       75.5 in Accession #:    [id number]     Weight:       330.0 lb Date of Birth:  1971/04/28      BSA:          2.728 m Patient Age:    65 years       BP:           115/70 mmHg Patient Gender: M              HR:           66 bpm. Exam Location:  Inpatient Procedure: 2D Echo, Cardiac Doppler and Color Doppler Indications:    Acute myocardial infarction, unspecified I21.9  History:        Patient has prior history of Echocardiogram examinations, most                 recent 08/10/2013. CAD; Risk Factors:Current Smoker,                 Hypertension, Dyslipidemia and Diabetes.  Sonographer:     Bernadene Person RDCS Referring Phys: [id number] [facility]  Sonographer Comments: Patient is morbidly obese. Image acquisition challenging due to respiratory motion. IMPRESSIONS  1. Limited study due to poor acoustic windows. Left ventricular ejection fraction, by estimation, is 55 to 60%. The left ventricle has normal function. Wall motion difficult to assess due to poor definition of the endocardial borders. Based on limited views, there appears to be hypokinesis of the basal inferior and basal inferolateral walls.The left ventricular internal cavity size was mildly dilated. Left ventricular diastolic parameters were normal.  2.  Right ventricular systolic function is normal. The right ventricular size is normal.  3. The mitral valve is normal in structure. Trivial mitral valve regurgitation. No evidence of mitral stenosis.  4. The aortic valve is tricuspid. Aortic valve regurgitation is not visualized. No aortic stenosis is present.  5. Aortic dilatation noted. There is mild dilatation of the aortic root, measuring 40 mm.  6. The inferior vena cava is dilated in size with >50% respiratory variability, suggesting right atrial pressure of 8 mmHg. Comparison(s): Compared to prior echo report in 2014, the EF appears to now be around 55% (previously reported at 45%). The basal inferolateral WMA is new but the inferobasal WMA was previously noted. FINDINGS  Left Ventricle: Left ventricular ejection fraction, by estimation, is 55 to 60%. The left ventricle has normal function. Wall motion difficult to assess due to poor definition of the endocardial borders. Based on limited views, there appears to be hypokinesis of the basal inferior and basal inferolateral walls. The left ventricular internal cavity size was mildly dilated. There is no left ventricular hypertrophy. Left ventricular diastolic parameters were normal. Right Ventricle: The right ventricular size is normal. No increase in right ventricular wall thickness.  Right ventricular systolic function is normal. Left Atrium: Left atrial size was normal in size. Right Atrium: Right atrial size was normal in size. Pericardium: There is no evidence of pericardial effusion. Mitral Valve: The mitral valve is normal in structure. Trivial mitral valve regurgitation. No evidence of mitral valve stenosis. Tricuspid Valve: The tricuspid valve is normal in structure. Tricuspid valve regurgitation is trivial. Aortic Valve: The aortic valve is tricuspid. Aortic valve regurgitation is not visualized. No aortic stenosis is present. Pulmonic Valve: The pulmonic valve was not well visualized. Pulmonic valve regurgitation is not visualized. Aorta: Aortic dilatation noted. There is mild dilatation of the aortic root, measuring 40 mm. Venous: The inferior vena cava is dilated in size with greater than 50% respiratory variability, suggesting right atrial pressure of 8 mmHg. IAS/Shunts: No atrial level shunt detected by color flow Doppler.  LEFT VENTRICLE PLAX 2D LVIDd:         6.50 cm  Diastology LVIDs:         4.00 cm  LV e' medial:    11.20 cm/s LV PW:         0.90 cm  LV E/e' medial:  9.8 LV IVS:        1.10 cm  LV e' lateral:   12.70 cm/s LVOT diam:     2.10 cm  LV E/e' lateral: 8.7 LV SV:         111 LV SV Index:   41 LVOT Area:     3.46 cm  RIGHT VENTRICLE RV S prime:     15.70 cm/s TAPSE (M-mode): 2.3 cm LEFT ATRIUM             Index       RIGHT ATRIUM           Index LA diam:        4.70 cm 1.72 cm/m  RA Area:     19.30 cm LA Vol (A2C):   65.1 ml 23.86 ml/m RA Volume:   45.40 ml  16.64 ml/m LA Vol (A4C):   70.3 ml 25.77 ml/m LA Biplane Vol: 71.1 ml 26.06 ml/m  AORTIC VALVE LVOT Vmax:   140.00 cm/s LVOT Vmean:  93.900 cm/s LVOT VTI:    0.321 m  AORTA Ao Root diam: 4.00 cm Ao Asc diam:  3.30 cm MITRAL VALVE MV Area (PHT): 2.56 cm     SHUNTS MV Decel Time: 296 msec     Systemic VTI:  0.32 m MV E velocity: 110.00 cm/s  Systemic Diam: 2.10 cm MV A velocity: 54.70 cm/s MV E/A ratio:  2.01  Gwyndolyn Kaufman MD Electronically signed by Gwyndolyn Kaufman MD Signature Date/Time: 03/13/2021/4:37:19 PM    Final     Procedures Procedures   Medications Ordered in ED Medications  0.9 %  sodium chloride infusion ( Intravenous New Bag/Given 03/13/21 0841)  aspirin chewable tablet 324 mg (324 mg Oral Given 03/13/21 0836)  heparin injection 9,000 Units (9,000 Units Intravenous Given by Other 03/13/21 [id number])    ED Course  I have reviewed the triage vital signs and the nursing notes.  Pertinent labs & imaging results that were available during my care of the patient were reviewed by me and considered in my medical decision making (see chart for details).    MDM Rules/Calculators/A&P                         Iv ns. Stat labs. Ecg.   As soon as ECG shown to me, code stemi activated. Asa. Heparin.   [facility] indicates no truck in area, and to call EMS.  EMS/911 called for emergent transport to [facility] cath lab.   Reviewed nursing notes and prior charts for additional history.   Dr Hardin/cardiology called back, discussed pt, ecg, stemi - [facility] on line and indicates no truck in area and to call EMS - RN/secretary advised to call 91/EMS re stemi transport to [facility].   Dr Wilhemina Cash accepts in transfer to [facility].   Labs reviewed/interpreted by me - trop high.  CXR reviewed/interpreted by me -  No pna.   Patient taken emergently to [facility] cath lab.   CRITICAL CARE RE: STEMI/ACS, emergent cardiac cath lab activation Performed by: Mirna Mires Total critical care time: 35 minutes Critical care time was exclusive of separately billable procedures and treating other patients. Critical care was necessary to treat or prevent imminent or life-threatening deterioration. Critical care was time spent personally by me on the following activities: development of treatment plan with patient and/or surrogate as well as nursing, discussions with consultants, evaluation of patient's response to treatment, examination  of patient, obtaining history from patient or surrogate, ordering and performing treatments and interventions, ordering and review of laboratory studies, ordering and review of radiographic studies, pulse oximetry and re-evaluation of patient's condition.     Final Clinical Impression(s) / ED Diagnoses Final diagnoses:  None    Rx / DC Orders ED Discharge Orders    None       Lajean Saver, MD 03/15/21 [phone]

## 2021-03-13 NOTE — Plan of Care (Signed)
  Problem: Clinical Measurements: Goal: Ability to maintain clinical measurements within normal limits will improve Outcome: Progressing Goal: Will remain free from infection Outcome: Progressing Goal: Diagnostic test results will improve Outcome: Progressing Goal: Respiratory complications will improve Outcome: Progressing Goal: Cardiovascular complication will be avoided Outcome: Progressing   Problem: Activity: Goal: Risk for activity intolerance will decrease Outcome: Progressing   Problem: Nutrition: Goal: Adequate nutrition will be maintained Outcome: Progressing   Problem: Coping: Goal: Level of anxiety will decrease Outcome: Progressing   Problem: Elimination: Goal: Will not experience complications related to bowel motility Outcome: Progressing Goal: Will not experience complications related to urinary retention Outcome: Progressing   Problem: Pain Managment: Goal: General experience of comfort will improve Outcome: Progressing   Problem: Safety: Goal: Ability to remain free from injury will improve Outcome: Progressing   Problem: Skin Integrity: Goal: Risk for impaired skin integrity will decrease Outcome: Progressing   Problem: Education: Goal: Understanding of CV disease, CV risk reduction, and recovery process will improve Outcome: Progressing Goal: Individualized Educational Video(s) Outcome: Progressing   Problem: Activity: Goal: Ability to return to baseline activity level will improve Outcome: Progressing   Problem: Cardiovascular: Goal: Ability to achieve and maintain adequate cardiovascular perfusion will improve Outcome: Progressing Goal: Vascular access site(s) Level 0-1 will be maintained Outcome: Progressing

## 2021-03-13 NOTE — ED Notes (Addendum)
Pt's clothing, shoes, cellphone, knife, medication bottle given to pt's wife per pt request.

## 2021-03-13 NOTE — Progress Notes (Signed)
  Echocardiogram 2D Echocardiogram has been performed.  Wayne Spencer 03/13/2021, 3:38 PM

## 2021-03-13 NOTE — H&P (Signed)
HISTORY AND PHYSICAL EXAMINATION  NAME:  Wayne Spencer   MRN: [id number] DOB:  11-13-1971   ADMIT DATE: 03/13/2021   03/13/2021 9:45 AM  Wayne Spencer is a 50 y.o. male with history of overlapping DES stents to the RCA in 2014 and stable non-STEMI.  The stents were 4 oh stents postdilated to 4.5 mm.  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  He had been relatively well but apparently has been having some chest discomfort over the last week, but was taking Tums.  Last night he took 6 times before is able to sleep after an episode of chest pain.  That lasted about an hour.  He did okay until this morning he awoke again has the same pain that did not go away with Tums.  He therefore called EMS.  EKG upon arrival to [facility] showed subtle 1 mm ST elevations in inferior leads and code STEMI was called.  He was transferred urgently to [facility] for catheterization.  Upon arrival he was having intermittent chest pain of 3-8/10, but did indicate he was feeling better.  EKG was also showing signs of improvement with less ST elevation.  He denies any PND, orthopnea or edema.  No rapid/irregular heart beats or palpitations.  No syncope or near syncope.    Past Medical History:  Diagnosis Date  . CAD (coronary artery disease)    a. s/p DES to RCA in 2014  . Cellulitis   . Chronic headache   . Hyperlipidemia   . Hypertension   . Nephrotic syndrome   . Renal insufficiency   . Tobacco abuse   . Umbilical hernia    Past Surgical History:  Procedure Laterality Date  . CORONARY STENT PLACEMENT    . LEFT HEART CATHETERIZATION WITH CORONARY ANGIOGRAM N/A 08/10/2013   Procedure: LEFT HEART CATHETERIZATION WITH CORONARY ANGIOGRAM;  Surgeon: [name], MD;  Location: [facility] CATH LAB;  Service: Cardiovascular;  Laterality: N/A;  . PERCUTANEOUS CORONARY STENT INTERVENTION (PCI-S) N/A 08/11/2013   Procedure: PERCUTANEOUS CORONARY STENT INTERVENTION (PCI-S);  Surgeon:  Blane Ohara, MD;  Location: [facility] CATH LAB;  Service: Cardiovascular;  Laterality: N/A;    FAMHx: Family History  Problem Relation Age of Onset  . Heart attack Father        89s  . Heart attack Brother        47s, s/p CABG, passed from MI at 79  . Heart attack Sister        71s  . CAD Mother   . Kidney failure Sister   . Diabetes Brother   . CAD Brother     SOCHx:  reports that he has been smoking cigarettes. He started smoking about 36 years ago. He has a 12.00 pack-year smoking history. He has never used smokeless tobacco. He reports previous alcohol use. He reports that he does not use drugs.  ALLERGIES: Allergies  Allergen Reactions  . Mobic [Meloxicam]     Unknown   . Penicillins Hives  . Tramadol     REACTION: itching, breaking out.    HOME MEDICATIONS: Medications Prior to Admission  Medication Sig Dispense Refill Last Dose  . acetaminophen (TYLENOL) 500 MG tablet Take 1,000 mg by mouth every 6 (six) hours as needed for headache.     . albuterol (PROVENTIL HFA;VENTOLIN HFA) 108 (90 Base) MCG/ACT inhaler Inhale 1-2 puffs into the lungs every 6 (six) hours as needed for wheezing or shortness  of breath.     . ALPRAZolam (XANAX) 1 MG tablet Take 1 mg by mouth 3 (three) times daily as needed for anxiety.     Marland Kitchen amLODipine (NORVASC) 10 MG tablet Take 1 tablet (10 mg total) by mouth daily. 180 tablet 3   . aspirin EC 81 MG EC tablet Take 1 tablet (81 mg total) by mouth daily.     . cyclobenzaprine (FLEXERIL) 10 MG tablet Take 10 mg by mouth 3 (three) times daily as needed for muscle spasms.     Marland Kitchen ezetimibe (ZETIA) 10 MG tablet Take 1 tablet (10 mg total) by mouth daily. 90 tablet 3   . furosemide (LASIX) 40 MG tablet TAKE 40 MG DAILY. MAY TAKE AN ADDITIONAL 20 MG AS NEEDED FOR SEVERE LEG SWELLING. 90 tablet 3   . hydrALAZINE (APRESOLINE) 100 MG tablet TAKE 1 TABLET BY MOUTH THREE TIMES DAILY. 90 tablet 3   . hydrALAZINE (APRESOLINE) 50 MG tablet Take 100 mg by mouth 3  (three) times daily. Recently increased by Dr. Elmarie Shiley (kidney MD).     Marland Kitchen HYDROcodone-acetaminophen (NORCO) 7.5-325 MG per tablet Take 1 tablet by mouth every 4 (four) hours as needed for pain.     . isosorbide mononitrate (IMDUR) 30 MG 24 hr tablet Take 15 mg by mouth daily.     Marland Kitchen losartan (COZAAR) 50 MG tablet Take 1 tablet (50 mg total) by mouth daily. 90 tablet 3   . metoprolol succinate (TOPROL-XL) 100 MG 24 hr tablet TAKE (1) TABLET BY MOUTH IN THE MORNING TAKE (1) TABLET AT BEDTIME TAKE WITH OR IMMEDIATELY FOLLOWING A MEAL. 180 tablet 2   . nitroGLYCERIN (NITROSTAT) 0.4 MG SL tablet Place 1 tablet (0.4 mg total) under the tongue every 5 (five) minutes as needed for chest pain. 25 tablet 3   . potassium chloride SA (KLOR-CON) 20 MEQ tablet TAKE (1) TABLET BY MOUTH TWICE DAILY. 180 tablet 3   . rosuvastatin (CRESTOR) 40 MG tablet Take 40 mg by mouth daily.       Review of Systems  Constitutional: Positive for malaise/fatigue (Has been feeling Tired this week). Negative for weight loss.  Respiratory: Positive for shortness of breath. Negative for cough.   Cardiovascular: Negative for leg swelling.  Gastrointestinal: Positive for nausea. Negative for abdominal pain, blood in stool and melena.  Genitourinary: Negative for hematuria.  Musculoskeletal: Negative for joint pain.  Neurological: Negative for dizziness.  Psychiatric/Behavioral: Negative for memory loss. The patient is not nervous/anxious and does not have insomnia.      PHYSICAL EXAM:Blood pressure 123/77, pulse 89, resp. rate (!) 22, height 6' 3.5" (1.918 m), weight (!) 149.7 kg, SpO2 98 %. General appearance: alert, cooperative, appears stated age, moderate distress, moderately obese and Noting anywhere from 3-8/10 chest pain depending on who asks.  No dyspnea. Neck: no carotid bruit, no JVD and Difficult assess due to body habitus. Lungs: Distant breath sounds, but mostly CTA B from anterior auscultation Heart: Distant heart  sounds, normal S1 and S2.  No obvious M/R/G. Abdomen: soft, non-tender; bowel sounds normal; no masses,  no organomegaly and Morbidly obese.  Unable to assess HSM. Extremities: extremities normal, atraumatic, no cyanosis or edema Pulses: 2+ and symmetric Skin: Skin color, texture, turgor normal. No rashes or lesions Neurologic: Grossly normal   Adult ECG Report Sinus rhythm - 84; Nonspecific intraventricular conduction delay Inferior infarct, acute (LCx) roughly 2 mm in II, III and aVF>>> Acute MI <<< Lateral leads are also involved ->  Personally reviewed.  IMPRESSION & PLAN Principal Problem:   Acute ST elevation myocardial infarction (STEMI) of inferior wall (HCC) Active Problems:   CAD S/P percutaneous coronary angioplasty   Hyperlipidemia associated with type 2 diabetes mellitus (HCC)   Essential hypertension   CKD (chronic kidney disease) stage 4, GFR 15-29 ml/min (HCC)   Tobacco abuse   ST elevation myocardial infarction (STEMI) of inferior wall ([place])   Annalee Genta has presented today for with a diagnosis of inferior STEMI.  He is here for emergent cardiac catheterization.    Risks / Complications include, but not limited to: Death, MI, CVA/TIA, VF/VT (with defibrillation), Bradycardia (need for temporary pacer placement), contrast induced nephropathy, bleeding / bruising / hematoma / pseudoaneurysm, vascular or coronary injury (with possible emergent CT or Vascular Surgery), adverse medication reactions, infection.     After consideration of risks, benefits and other options for treatment, the patient has consented to Procedure(s):  LEFT HEART CATHETERIZATION AND CORONARY ANGIOGRAPHY +/- AD Lucien  as a surgical intervention.   We will proceed with the planned procedure.  More plans based on cath results.   For the most part we will continue home medications.  Will need post-cath hydration for renal insufficiency.  Check 2D echo  tomorrow.  Check fasting the panel and A1c.    [facility] [address] [place], [place]  [address]  [phone]  03/13/2021 9:45 AM

## 2021-03-13 NOTE — ED Notes (Signed)
[facility] at bedside. Report given to Paramedic.

## 2021-03-13 NOTE — TOC Benefit Eligibility Note (Signed)
Patient Teacher, English as a foreign language completed.    The patient is currently admitted and upon discharge could be taking Brilinta 90 mg.  The current 30 day co-pay is, $3.00.   The patient is insured through [place], [place] Patient Advocate Specialist [place] Team Direct Number: [phone]  Fax: [phone]

## 2021-03-13 NOTE — Plan of Care (Signed)
  Problem: Education: Goal: Knowledge of General Education information will improve Description: Including pain rating scale, medication(s)/side effects and non-pharmacologic comfort measures Outcome: Progressing   Problem: Clinical Measurements: Goal: Ability to maintain clinical measurements within normal limits will improve Outcome: Progressing Goal: Will remain free from infection Outcome: Progressing Goal: Diagnostic test results will improve Outcome: Progressing Goal: Respiratory complications will improve Outcome: Progressing Goal: Cardiovascular complication will be avoided Outcome: Progressing   Problem: Nutrition: Goal: Adequate nutrition will be maintained Outcome: Progressing   Problem: Elimination: Goal: Will not experience complications related to bowel motility Outcome: Progressing Goal: Will not experience complications related to urinary retention Outcome: Progressing   Problem: Skin Integrity: Goal: Risk for impaired skin integrity will decrease Outcome: Progressing   Problem: Education: Goal: Understanding of CV disease, CV risk reduction, and recovery process will improve Outcome: Progressing Goal: Individualized Educational Video(s) Outcome: Progressing   Problem: Cardiovascular: Goal: Ability to achieve and maintain adequate cardiovascular perfusion will improve Outcome: Progressing Goal: Vascular access site(s) Level 0-1 will be maintained Outcome: Progressing   Problem: Health Behavior/Discharge Planning: Goal: Ability to safely manage health-related needs after discharge will improve Outcome: Progressing

## 2021-03-14 ENCOUNTER — Encounter (HOSPITAL_COMMUNITY): Payer: Self-pay | Admitting: *Deleted

## 2021-03-14 ENCOUNTER — Encounter (HOSPITAL_COMMUNITY): Payer: Self-pay | Admitting: Cardiology

## 2021-03-14 ENCOUNTER — Encounter (HOSPITAL_COMMUNITY): Payer: Self-pay

## 2021-03-14 DIAGNOSIS — E1169 Type 2 diabetes mellitus with other specified complication: Secondary | ICD-10-CM | POA: Diagnosis not present

## 2021-03-14 DIAGNOSIS — I2119 ST elevation (STEMI) myocardial infarction involving other coronary artery of inferior wall: Secondary | ICD-10-CM | POA: Diagnosis not present

## 2021-03-14 DIAGNOSIS — I10 Essential (primary) hypertension: Secondary | ICD-10-CM | POA: Diagnosis not present

## 2021-03-14 DIAGNOSIS — N1832 Chronic kidney disease, stage 3b: Secondary | ICD-10-CM

## 2021-03-14 DIAGNOSIS — Z720 Tobacco use: Secondary | ICD-10-CM | POA: Diagnosis not present

## 2021-03-14 LAB — CBC
HCT: 40.2 % (ref 39.0–52.0)
Hemoglobin: 13.7 g/dL (ref 13.0–17.0)
MCH: 33.6 pg (ref 26.0–34.0)
MCHC: 34.1 g/dL (ref 30.0–36.0)
MCV: 98.5 fL (ref 80.0–100.0)
Platelets: 179 10*3/uL (ref 150–400)
RBC: 4.08 MIL/uL — ABNORMAL LOW (ref 4.22–5.81)
RDW: 12.8 % (ref 11.5–15.5)
WBC: 13 10*3/uL — ABNORMAL HIGH (ref 4.0–10.5)
nRBC: 0 % (ref 0.0–0.2)

## 2021-03-14 LAB — BASIC METABOLIC PANEL
Anion gap: 9 (ref 5–15)
BUN: 12 mg/dL (ref 6–20)
CO2: 22 mmol/L (ref 22–32)
Calcium: 10 mg/dL (ref 8.9–10.3)
Chloride: 107 mmol/L (ref 98–111)
Creatinine, Ser: 1.97 mg/dL — ABNORMAL HIGH (ref 0.61–1.24)
GFR, Estimated: 41 mL/min — ABNORMAL LOW (ref >60)
Glucose, Bld: 100 mg/dL — ABNORMAL HIGH (ref 70–99)
Potassium: 3.5 mmol/L (ref 3.5–5.1)
Sodium: 138 mmol/L (ref 135–145)

## 2021-03-14 LAB — MRSA PCR SCREENING: MRSA by PCR: NEGATIVE

## 2021-03-14 MED ORDER — HYDROCODONE-ACETAMINOPHEN 5-325 MG PO TABS
1.0000 | ORAL_TABLET | Freq: Four times a day (QID) | ORAL | Status: DC | PRN
  Administered 2021-03-14 – 2021-03-15 (×3): 2 via ORAL
  Filled 2021-03-14 (×3): qty 2

## 2021-03-14 MED ORDER — CYCLOBENZAPRINE HCL 10 MG PO TABS
10.0000 mg | ORAL_TABLET | Freq: Three times a day (TID) | ORAL | Status: DC | PRN
  Administered 2021-03-14: 10 mg via ORAL
  Filled 2021-03-14 (×2): qty 1

## 2021-03-14 MED ORDER — POTASSIUM CHLORIDE CRYS ER 20 MEQ PO TBCR
20.0000 meq | EXTENDED_RELEASE_TABLET | Freq: Once | ORAL | Status: AC
  Administered 2021-03-14: 20 meq via ORAL
  Filled 2021-03-14: qty 1

## 2021-03-14 NOTE — Progress Notes (Signed)
CARDIAC REHAB PHASE I   PRE:  Rate/Rhythm: 85 SR  BP:  Sitting: 113/68      SaO2: 96 RA  MODE:  Ambulation: 370 ft   POST:  Rate/Rhythm: 111 ST  BP:  Sitting: 130/73    SaO2: 96 RA   Pt ambulated 367f in hallway standby assist with slow, steady gait. Pt took 2 short standing rest breaks c/o SOB and dizziness. Pt helped to BR than returned to recliner. Pt educated on importance of ASA, Brilinta, and NTG. Pt given MI book along with heart healthy diet. Encouraged smoking cessation. Reviewed site care, restrictions, and exercise guidelines. Will refer to CRP II [place]. Pt requesting work note for son, RN made aware. Will continue to follow.  [id number] Falco RN BSN 03/14/2021 1:40 PM

## 2021-03-14 NOTE — Progress Notes (Signed)
Progress Note  Patient Name: Wayne Spencer Date of Encounter: 03/14/2021  [facility] Cardiologist: Carlyle Dolly, MD   Subjective   Mild chest pressure but much improved. No dyspnea  Inpatient Medications    Scheduled Meds: . amLODipine  10 mg Oral Daily  . aspirin EC  81 mg Oral Daily  . Chlorhexidine Gluconate Cloth  6 each Topical Daily  . ezetimibe  10 mg Oral Daily  . hydrALAZINE  100 mg Oral TID  .  HYDROmorphone (DILAUDID) injection  1 mg Intravenous Once  . losartan  50 mg Oral Daily  . metoprolol succinate  100 mg Oral Daily  . rosuvastatin  40 mg Oral Daily  . sodium chloride flush  3 mL Intravenous Q12H  . ticagrelor  90 mg Oral BID   Continuous Infusions: . sodium chloride 20 mL/hr at 03/13/21 0841  . sodium chloride     PRN Meds: sodium chloride, acetaminophen, albuterol, ALPRAZolam, HYDROcodone-acetaminophen, nitroGLYCERIN, ondansetron (ZOFRAN) IV, sodium chloride flush   Vital Signs    Vitals:   03/14/21 0400 03/14/21 0500 03/14/21 0600 03/14/21 0700  BP: (!) 110/55 (!) 123/53 116/63 124/61  Pulse: 90 68 (!) 107 (!) 103  Resp: (!) '21 12 20 '$ (!) 24  Temp:      TempSrc:      SpO2: 94% 95% 96% 96%  Weight:      Height:        Intake/Output Summary (Last 24 hours) at 03/14/2021 0714 Last data filed at 03/14/2021 0630 Gross per 24 hour  Intake 3222.56 ml  Output 8025 ml  Net -4802.44 ml   Last 3 Weights 03/13/2021 10/19/2020 09/12/2020  Weight (lbs) 330 lb 330 lb 331 lb  Weight (kg) 149.687 kg 149.687 kg 150.141 kg      Telemetry    sinus - Personally Reviewed  ECG    Sinus, subtle inferior ST elevation - Personally Reviewed  Physical Exam   GEN: No acute distress.   Neck: No JVD Cardiac: RRR, no murmurs, rubs, or gallops.  Respiratory: Clear to auscultation bilaterally. GI: Soft, nontender, non-distended  MS: No edema; No deformity. Neuro:  Nonfocal  Psych: Normal affect   Labs    High Sensitivity Troponin:   Recent  Labs  Lab 03/13/21 0843 03/13/21 1148 03/13/21 1410 03/13/21 1752  TROPONINIHS 630* 766* 1,179* 4,679*      Chemistry Recent Labs  Lab 03/13/21 0843 03/14/21 0110  NA 134* 138  K 3.4* 3.5  CL 103 107  CO2 23 22  GLUCOSE 157* 100*  BUN 18 12  CREATININE 2.09* 1.97*  CALCIUM 9.4 10.0  PROT 6.4*  --   ALBUMIN 3.3*  --   AST 30  --   ALT 30  --   ALKPHOS 60  --   BILITOT 0.6  --   GFRNONAA 38* 41*  ANIONGAP 8 9     Hematology Recent Labs  Lab 03/13/21 0843 03/14/21 0110  WBC 7.8 13.0*  RBC 4.02* 4.08*  HGB 13.4 13.7  HCT 39.9 40.2  MCV 99.3 98.5  MCH 33.3 33.6  MCHC 33.6 34.1  RDW 12.8 12.8  PLT 178 179    BNPNo results for input(s): BNP, PROBNP in the last 168 hours.   DDimer No results for input(s): DDIMER in the last 168 hours.   Radiology    CARDIAC CATHETERIZATION  Result Date: [id number]  LV end diastolic pressure is mildly elevated.  There is no aortic valve stenosis.  -----------------------------------------------------------  RPAV  lesion is 60% stenosed with 100% stenosed (culprit lesion) side branch in 2nd RPL.  Balloon angioplasty was performed on the follow-up on the vessel into PL 3 to restore flow) using a BALLOON SAPPHIRE 2.0X12.  Post intervention, there is a 30% residual stenosis leading into PL 3. Post intervention, the side branch remained 100% stenosed  Mid RCA to Dist RCA stent is 5% stenosed.  Ost RCA lesion is 30% stenosed.  ------------------------------------------------------------  Prox LAD lesion is 45% stenosed with 30% stenosed side branch in 1st Diag.  Mid LAD lesion is 30% stenosed with 50% stenosed side branch in 2nd Diag.  Ost Cx to Prox Cx lesion is 25% stenosed.  SUMMARY  Single-vessel disease with widely patent stent in the distal RCA, culprit lesion is 100% flush occlusion of RPL 2 (unable to engage with wire), 80% stenosis of follow-on RPA V into RPL 3 (successful balloon PTCA only) =>  very difficult vessel to  wire, very tortuous and extremely distal.  Poor visualization due to body habitus.  Therefore chose to do PTCA only and allow for completion of the infarct-related artery closure.  Due to the patient's renal insufficiency and very large patulous vessels requiring  large volumes of contrast to fill and requirement for frequent angiography, chose to stop at this point.  Diffuse mild to moderate disease in the LAD with most prominent lesion being 50 % lesion in the ostial D2. RECOMMENDATIONS:  Overnight in CCU-we will do 3 hours IV Aggrastat to help distal flow.  Check 2D echo in the morning; cycle troponins  [facility] consult for the morning.  Has been loaded with Brilinta, continue aspirin and Brilinta for ACS x1 year.  (Would likely be able to hold after 6 months)  Continue aggressive risk factor modification, has multiple medications already listed.  Will adjust as BP and labs tolerate.  Expect if he remains comfortable in the next day or so he can probably discharge home after 48 hours. Glenetta Hew, MD  DG Chest Port 1 View  Result Date: 03/13/2021 CLINICAL DATA:  Sudden onset chest pain this morning, STEMI, hypertension, coronary artery disease, smoker EXAM: PORTABLE CHEST 1 VIEW COMPARISON:  Portable exam 0842 hours compared to 01/20/2017 FINDINGS: Normal heart size, mediastinal contours, and pulmonary vascularity. Minimal bibasilar atelectasis. Lungs otherwise clear. No acute infiltrate, pleural effusion or pneumothorax. Osseous structures unremarkable. IMPRESSION: Minimal bibasilar atelectasis. Electronically Signed   By: Lavonia Dana M.D.   On: 03/13/2021 08:50   ECHOCARDIOGRAM COMPLETE  Result Date: 03/13/2021    ECHOCARDIOGRAM REPORT   Patient Name:   Wayne Spencer Date of Exam: 03/13/2021 Medical Rec #:  [id number]      Height:       75.5 in Accession #:    [id number]     Weight:       330.0 lb Date of Birth:  [birth year]      BSA:          2.728 m Patient Age:    50 years       BP:            115/70 mmHg Patient Gender: M              HR:           66 bpm. Exam Location:  Inpatient Procedure: 2D Echo, Cardiac Doppler and Color Doppler Indications:    Acute myocardial infarction, unspecified I21.9  History:        Patient has prior history of Echocardiogram  examinations, most                 recent 08/10/2013. CAD; Risk Factors:Current Smoker,                 Hypertension, Dyslipidemia and Diabetes.  Sonographer:    Bernadene Person RDCS Referring Phys: [phone] [facility]  Sonographer Comments: Patient is morbidly obese. Image acquisition challenging due to respiratory motion. IMPRESSIONS  1. Limited study due to poor acoustic windows. Left ventricular ejection fraction, by estimation, is 55 to 60%. The left ventricle has normal function. Wall motion difficult to assess due to poor definition of the endocardial borders. Based on limited views, there appears to be hypokinesis of the basal inferior and basal inferolateral walls.The left ventricular internal cavity size was mildly dilated. Left ventricular diastolic parameters were normal.  2. Right ventricular systolic function is normal. The right ventricular size is normal.  3. The mitral valve is normal in structure. Trivial mitral valve regurgitation. No evidence of mitral stenosis.  4. The aortic valve is tricuspid. Aortic valve regurgitation is not visualized. No aortic stenosis is present.  5. Aortic dilatation noted. There is mild dilatation of the aortic root, measuring 40 mm.  6. The inferior vena cava is dilated in size with >50% respiratory variability, suggesting right atrial pressure of 8 mmHg. Comparison(s): Compared to prior echo report in 2014, the EF appears to now be around 55% (previously reported at 45%). The basal inferolateral WMA is new but the inferobasal WMA was previously noted. FINDINGS  Left Ventricle: Left ventricular ejection fraction, by estimation, is 55 to 60%. The left ventricle has normal function. Wall motion difficult to  assess due to poor definition of the endocardial borders. Based on limited views, there appears to be hypokinesis of the basal inferior and basal inferolateral walls. The left ventricular internal cavity size was mildly dilated. There is no left ventricular hypertrophy. Left ventricular diastolic parameters were normal. Right Ventricle: The right ventricular size is normal. No increase in right ventricular wall thickness. Right ventricular systolic function is normal. Left Atrium: Left atrial size was normal in size. Right Atrium: Right atrial size was normal in size. Pericardium: There is no evidence of pericardial effusion. Mitral Valve: The mitral valve is normal in structure. Trivial mitral valve regurgitation. No evidence of mitral valve stenosis. Tricuspid Valve: The tricuspid valve is normal in structure. Tricuspid valve regurgitation is trivial. Aortic Valve: The aortic valve is tricuspid. Aortic valve regurgitation is not visualized. No aortic stenosis is present. Pulmonic Valve: The pulmonic valve was not well visualized. Pulmonic valve regurgitation is not visualized. Aorta: Aortic dilatation noted. There is mild dilatation of the aortic root, measuring 40 mm. Venous: The inferior vena cava is dilated in size with greater than 50% respiratory variability, suggesting right atrial pressure of 8 mmHg. IAS/Shunts: No atrial level shunt detected by color flow Doppler.  LEFT VENTRICLE PLAX 2D LVIDd:         6.50 cm  Diastology LVIDs:         4.00 cm  LV e' medial:    11.20 cm/s LV PW:         0.90 cm  LV E/e' medial:  9.8 LV IVS:        1.10 cm  LV e' lateral:   12.70 cm/s LVOT diam:     2.10 cm  LV E/e' lateral: 8.7 LV SV:         111 LV SV Index:   41 LVOT  Area:     3.46 cm  RIGHT VENTRICLE RV S prime:     15.70 cm/s TAPSE (M-mode): 2.3 cm LEFT ATRIUM             Index       RIGHT ATRIUM           Index LA diam:        4.70 cm 1.72 cm/m  RA Area:     19.30 cm LA Vol (A2C):   65.1 ml 23.86 ml/m RA Volume:    45.40 ml  16.64 ml/m LA Vol (A4C):   70.3 ml 25.77 ml/m LA Biplane Vol: 71.1 ml 26.06 ml/m  AORTIC VALVE LVOT Vmax:   140.00 cm/s LVOT Vmean:  93.900 cm/s LVOT VTI:    0.321 m  AORTA Ao Root diam: 4.00 cm Ao Asc diam:  3.30 cm MITRAL VALVE MV Area (PHT): 2.56 cm     SHUNTS MV Decel Time: 296 msec     Systemic VTI:  0.32 m MV E velocity: 110.00 cm/s  Systemic Diam: 2.10 cm MV A velocity: 54.70 cm/s MV E/A ratio:  2.01 Gwyndolyn Kaufman MD Electronically signed by Gwyndolyn Kaufman MD Signature Date/Time: 03/13/2021/4:37:19 PM    Final     Cardiac Studies   See above  Patient Profile     50 y.o. male with history of CAD, HTN, HLD, tobacco abuse admitted with inferior STEMI  Assessment & Plan    1. CAD/Inferior STEMI: Cardiac cath yesterday. Inferior STEMI due to occlusion of small posterolateral branch of the distal RCA. Unable to open branch. He is chest pain free today. Will plan medical management of CAD. LVEF=55-60% by echo yesterday. Continue DAPT  with ASA and Brilinta for one year. Continue beta blocker and statin/Zetia.    2. HTN: BP stable. Continue current therapy  3. HLD: continue statin.   4. Tobacco abuse: Smoking cessation counseling provided  5. CKD, stage 3: Renal function stable post cath. BMET tomorrow  Will transfer to telemetry unit today   For questions or updates, please contact [place] Please consult [email or web address] for contact info under        Signed, Lauree Chandler, MD  03/14/2021, 7:14 AM

## 2021-03-15 ENCOUNTER — Other Ambulatory Visit (HOSPITAL_COMMUNITY): Payer: Self-pay

## 2021-03-15 ENCOUNTER — Telehealth: Payer: Self-pay | Admitting: Cardiology

## 2021-03-15 DIAGNOSIS — Z720 Tobacco use: Secondary | ICD-10-CM | POA: Diagnosis not present

## 2021-03-15 DIAGNOSIS — I2119 ST elevation (STEMI) myocardial infarction involving other coronary artery of inferior wall: Secondary | ICD-10-CM | POA: Diagnosis not present

## 2021-03-15 DIAGNOSIS — I10 Essential (primary) hypertension: Secondary | ICD-10-CM | POA: Diagnosis not present

## 2021-03-15 LAB — BASIC METABOLIC PANEL
Anion gap: 4 — ABNORMAL LOW (ref 5–15)
BUN: 14 mg/dL (ref 6–20)
CO2: 26 mmol/L (ref 22–32)
Calcium: 9.6 mg/dL (ref 8.9–10.3)
Chloride: 106 mmol/L (ref 98–111)
Creatinine, Ser: 2.18 mg/dL — ABNORMAL HIGH (ref 0.61–1.24)
GFR, Estimated: 36 mL/min — ABNORMAL LOW (ref >60)
Glucose, Bld: 106 mg/dL — ABNORMAL HIGH (ref 70–99)
Potassium: 3.6 mmol/L (ref 3.5–5.1)
Sodium: 136 mmol/L (ref 135–145)

## 2021-03-15 MED ORDER — HYDRALAZINE HCL 50 MG PO TABS: 50.0000 mg | ORAL_TABLET | Freq: Three times a day (TID) | ORAL | 3 refills | Status: DC

## 2021-03-15 MED ORDER — TICAGRELOR 90 MG PO TABS
90.0000 mg | ORAL_TABLET | Freq: Two times a day (BID) | ORAL | 11 refills | Status: DC
  Filled 2021-03-15: qty 60, 30d supply, fill #0

## 2021-03-15 MED ORDER — HYDRALAZINE HCL 50 MG PO TABS
50.0000 mg | ORAL_TABLET | Freq: Three times a day (TID) | ORAL | Status: DC
  Administered 2021-03-15: 50 mg via ORAL
  Filled 2021-03-15: qty 1

## 2021-03-15 MED ORDER — POTASSIUM CHLORIDE CRYS ER 20 MEQ PO TBCR
40.0000 meq | EXTENDED_RELEASE_TABLET | Freq: Once | ORAL | Status: AC
  Administered 2021-03-15: 40 meq via ORAL
  Filled 2021-03-15: qty 2

## 2021-03-15 NOTE — Telephone Encounter (Signed)
Patient has a TOC follow-up appointment scheduled for 03/28/21 at 2:30 PM with Bernerd Pho, PA, per [place], [place].

## 2021-03-15 NOTE — Progress Notes (Signed)
CARDIAC REHAB PHASE I   PRE:  Rate/Rhythm: 98 SR  BP:  Sitting: 130/77      SaO2: 97 RA  MODE:  Ambulation: 740 ft   POST:  Rate/Rhythm: 117 ST  BP:  Sitting: 108/78    SaO2: 97 RA   Pt ambulated 75f in hallway independently with steady gait. Pt able to increase distance today, feels stronger and less SOB. Pt helped to BR than returned to recliner. Reinforced importance of ASA and Brilinta. Reviewed site care, restrictions and exercise guidelines. Referred to CRP II [place].   [id number] Falco RN BSN 03/15/2021 9:37 AM

## 2021-03-15 NOTE — Telephone Encounter (Signed)
Patient contacted regarding discharge from [facility] on 03/15/21.  Patient understands to follow up with provider B.Strader,PA-C on 03/28/21 at 230 pm at [place]. Patient understands discharge instructions? yes Patient understands medications and regiment? yes Patient understands to bring all medications to this visit? yes    Patient still waiting for discharge from [facility].Confirmed f/u date/time and he will call back if he has any questions.

## 2021-03-15 NOTE — Plan of Care (Signed)
  Problem: Education: Goal: Knowledge of General Education information will improve Description: Including pain rating scale, medication(s)/side effects and non-pharmacologic comfort measures Outcome: Adequate for Discharge   Problem: Health Behavior/Discharge Planning: Goal: Ability to manage health-related needs will improve Outcome: Adequate for Discharge   Problem: Clinical Measurements: Goal: Ability to maintain clinical measurements within normal limits will improve Outcome: Adequate for Discharge Goal: Will remain free from infection Outcome: Adequate for Discharge Goal: Diagnostic test results will improve Outcome: Adequate for Discharge Goal: Respiratory complications will improve Outcome: Adequate for Discharge Goal: Cardiovascular complication will be avoided Outcome: Adequate for Discharge   Problem: Nutrition: Goal: Adequate nutrition will be maintained Outcome: Adequate for Discharge   Problem: Elimination: Goal: Will not experience complications related to bowel motility Outcome: Adequate for Discharge Goal: Will not experience complications related to urinary retention Outcome: Adequate for Discharge   Problem: Education: Goal: Understanding of CV disease, CV risk reduction, and recovery process will improve Outcome: Adequate for Discharge Goal: Individualized Educational Video(s) Outcome: Adequate for Discharge   Problem: Activity: Goal: Ability to return to baseline activity level will improve Outcome: Adequate for Discharge

## 2021-03-15 NOTE — Discharge Summary (Addendum)
Discharge Summary    Patient ID: Wayne Spencer MRN: [id number]; DOB: 14-Dec-1970  Admit date: 03/13/2021 Discharge date: 03/15/2021  PCP:  Celene Squibb, MD   [place]  Cardiologist:  Carlyle Dolly, MD   Discharge Diagnoses    Principal Problem:   Acute ST elevation myocardial infarction (STEMI) of inferior wall [facility]) Active Problems:   Tobacco abuse   Hyperlipidemia associated with type 2 diabetes mellitus ([place])   Essential hypertension   CAD S/P percutaneous coronary angioplasty   CKD (chronic kidney disease) stage 4, GFR 15-29 ml/min (HCC)   ST elevation myocardial infarction (STEMI) of inferior wall ([place])    Diagnostic Studies/Procedures    Echo 03/13/2021 1. Limited study due to poor acoustic windows. Left ventricular ejection  fraction, by estimation, is 55 to 60%. The left ventricle has normal  function. Wall motion difficult to assess due to poor definition of the  endocardial borders. Based on limited  views, there appears to be hypokinesis of the basal inferior and basal  inferolateral walls.The left ventricular internal cavity size was mildly  dilated. Left ventricular diastolic parameters were normal.  2. Right ventricular systolic function is normal. The right ventricular  size is normal.  3. The mitral valve is normal in structure. Trivial mitral valve  regurgitation. No evidence of mitral stenosis.  4. The aortic valve is tricuspid. Aortic valve regurgitation is not  visualized. No aortic stenosis is present.  5. Aortic dilatation noted. 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  6. The inferior vena cava is dilated in size with >50% respiratory  variability, suggesting right atrial pressure of 8 mmHg.   Comparison(s): Compared to prior echo report in 2014, the EF appears to  now be around 55% (previously reported at 45%). The basal inferolateral  WMA is new but the inferobasal WMA was previously  noted.   Coronary/Graft Acute MI Revascularization  03/13/2021  LEFT HEART CATH AND CORONARY ANGIOGRAPHY    Conclusion    LV end diastolic pressure is mildly elevated.  There is no aortic valve stenosis.  -----------------------------------------------------------  RPAV lesion is 60% stenosed with 100% stenosed (culprit lesion) side branch in 2nd RPL.  Balloon angioplasty was performed on the follow-up on the vessel into PL 3 to restore flow) using a BALLOON SAPPHIRE 2.0X12.  Post intervention, there is a 30% residual stenosis leading into PL 3. Post intervention, the side branch remained 100% stenosed  Mid RCA to Dist RCA stent is 5% stenosed.  Ost RCA lesion is 30% stenosed.  ------------------------------------------------------------  Prox LAD lesion is 45% stenosed with 30% stenosed side branch in 1st Diag.  Mid LAD lesion is 30% stenosed with 50% stenosed side branch in 2nd Diag.  Ost Cx to Prox Cx lesion is 25% stenosed.   SUMMARY  Single-vessel disease with widely patent stent in the distal RCA, culprit lesion is 100% flush occlusion of RPL 2 (unable to engage with wire), 80% stenosis of follow-on RPA V into RPL 3 (successful balloon PTCA only) => ? very difficult vessel to wire, very tortuous and extremely distal.  Poor visualization due to body habitus.  Therefore chose to do PTCA only and allow for completion of the infarct-related artery closure. ? Due to the patient's renal insufficiency and very large patulous vessels requiring  large volumes of contrast to fill and requirement for frequent angiography, chose to stop at this point.  Diffuse mild to moderate disease in the LAD with most  prominent lesion being 50 % lesion in the ostial D2.    RECOMMENDATIONS:  Overnight in CCU-we will do 3 hours IV Aggrastat to help distal flow.  Check 2D echo in the morning; cycle troponins  [facility] consult for the morning.  Has been loaded with Brilinta, continue  aspirin and Brilinta for ACS x1 year.  (Would likely be able to hold after 6 months)  Continue aggressive risk factor modification, has multiple medications already listed.  Will adjust as BP and labs tolerate.  Expect if he remains comfortable in the next day or so he can probably discharge home after 48 hours.    Glenetta Hew, MD  Diagnostic Dominance: Right    Intervention        History of Present Illness     Wayne Spencer is a 50 y.o. male with with history of CAD s/p overlapping DES stents to the RCA in 2014, CKD 3-4 with nephrotic syndrome and baseline creatinine of 2, HTN, HLD and tobacco abuse admitted with STEMI.   He had been relatively well but apparently has been having some chest discomfort over the last week, but was taking Tums.  Night prior to arrival he took 6 times before is able to sleep after an episode of chest pain.  That lasted about an hour.  He did okay until  morning he awoke again has the same pain that did not go away with Tums.  He therefore called EMS.  EKG upon arrival to [facility] showed subtle 1 mm ST elevations in inferior leads and code STEMI was called.  He was transferred urgently to [facility] for catheterization.  Upon arrival he was having intermittent chest pain of 3-8/10, but did indicate he was feeling better.  EKG was also showing signs of improvement with less ST elevation.  Hospital Course     Consultants: None  1. CAD/Inferior STEMI: Cardiac cath 03/13/21 showed occlusion of small posterolateral branch of the distal RCA. Unable to open branch. Recomended medical management of CAD. LVEF 55-60% by echo 03/13/21. No recurrent pain. Ambulated well.  -Continue DAPT with ASA and Brilinta for one year.  -Continue beta blocker, statin and Zetia.     2. HTN: BP stable. Reduced home hydralazine to 50 mg po TID. Continue BB, ARB and amlodipine.   3. HLD: 03/13/2021: Cholesterol 138; HDL 29; LDL Cholesterol 65; Triglycerides  219; VLDL 44Continue crestor and Zetia. Consider referal to lipid clinic.   4. Tobacco abuse: Smoking cessation counseling provided  5. CKD, stage 3: Renal function stable post cath with baseline around 2.0-2.2. Continue ARB. BMP at follow up.   6. Hypokalemia: Resolved   Did the patient have an acute coronary syndrome (MI, NSTEMI, STEMI, etc) this admission?:  Yes                               AHA/ACC Clinical Performance & Quality Measures: 1. Aspirin prescribed? - Yes 2. ADP Receptor Inhibitor (Plavix/Clopidogrel, Brilinta/Ticagrelor or Effient/Prasugrel) prescribed (includes medically managed patients)? - Yes 3. Beta Blocker prescribed? - Yes 4. High Intensity Statin (Lipitor 40-'80mg'$  or Crestor 20-'40mg'$ ) prescribed? - Yes 5. EF assessed during THIS hospitalization? - Yes 6. For EF <40%, was ACEI/ARB prescribed? - Yes 7. For EF <40%, Aldosterone Antagonist (Spironolactone or Eplerenone) prescribed? - Not Applicable (EF >/= [id number]) 8. Cardiac Rehab Phase II ordered (including medically managed patients)? - Yes       _____________  Discharge Vitals Blood pressure 113/60, pulse 95, temperature 98.4 F (36.9 C), temperature source Oral, resp. rate 16, height 6' 3.5" (1.918 m), weight (!) 149.7 kg, SpO2 98 %.  Filed Weights   03/13/21 0826  Weight: (!) 149.7 kg    Labs & Radiologic Studies    CBC Recent Labs    03/13/21 0843 03/14/21 0110  WBC 7.8 13.0*  HGB 13.4 13.7  HCT 39.9 40.2  MCV 99.3 98.5  PLT 178 0000000   Basic Metabolic Panel Recent Labs    03/14/21 0110 03/15/21 0041  NA 138 136  K 3.5 3.6  CL 107 106  CO2 22 26  GLUCOSE 100* 106*  BUN 12 14  CREATININE 1.97* 2.18*  CALCIUM 10.0 9.6   Liver Function Tests Recent Labs    03/13/21 0843  AST 30  ALT 30  ALKPHOS 60  BILITOT 0.6  PROT 6.4*  ALBUMIN 3.3*   No results for input(s): LIPASE, AMYLASE in the last 72 hours. High Sensitivity Troponin:   Recent Labs  Lab 03/13/21 0843 03/13/21 1148  03/13/21 1410 03/13/21 1752  TROPONINIHS 630* 766* 1,179* 4,679*    Hemoglobin A1C Recent Labs    03/13/21 0844  HGBA1C 6.3*   Fasting Lipid Panel Recent Labs    03/13/21 0844  CHOL 138  HDL 29*  LDLCALC 65  TRIG 219*  CHOLHDL 4.8   _____________  CARDIAC CATHETERIZATION  Result Date: [id number]  LV end diastolic pressure is mildly elevated.  There is no aortic valve stenosis.  -----------------------------------------------------------  RPAV lesion is 60% stenosed with 100% stenosed (culprit lesion) side branch in 2nd RPL.  Balloon angioplasty was performed on the follow-up on the vessel into PL 3 to restore flow) using a BALLOON SAPPHIRE 2.0X12.  Post intervention, there is a 30% residual stenosis leading into PL 3. Post intervention, the side branch remained 100% stenosed  Mid RCA to Dist RCA stent is 5% stenosed.  Ost RCA lesion is 30% stenosed.  ------------------------------------------------------------  Prox LAD lesion is 45% stenosed with 30% stenosed side branch in 1st Diag.  Mid LAD lesion is 30% stenosed with 50% stenosed side branch in 2nd Diag.  Ost Cx to Prox Cx lesion is 25% stenosed.  SUMMARY  Single-vessel disease with widely patent stent in the distal RCA, culprit lesion is 100% flush occlusion of RPL 2 (unable to engage with wire), 80% stenosis of follow-on RPA V into RPL 3 (successful balloon PTCA only) =>  very difficult vessel to wire, very tortuous and extremely distal.  Poor visualization due to body habitus.  Therefore chose to do PTCA only and allow for completion of the infarct-related artery closure.  Due to the patient's renal insufficiency and very large patulous vessels requiring  large volumes of contrast to fill and requirement for frequent angiography, chose to stop at this point.  Diffuse mild to moderate disease in the LAD with most prominent lesion being 50 % lesion in the ostial D2. RECOMMENDATIONS:  Overnight in CCU-we will do 3 hours  IV Aggrastat to help distal flow.  Check 2D echo in the morning; cycle troponins  [facility] consult for the morning.  Has been loaded with Brilinta, continue aspirin and Brilinta for ACS x1 year.  (Would likely be able to hold after 6 months)  Continue aggressive risk factor modification, has multiple medications already listed.  Will adjust as BP and labs tolerate.  Expect if he remains comfortable in the next day or so he can probably discharge home  after 48 hours. Glenetta Hew, MD  DG Chest Port 1 View  Result Date: 03/13/2021 CLINICAL DATA:  Sudden onset chest pain this morning, STEMI, hypertension, coronary artery disease, smoker EXAM: PORTABLE CHEST 1 VIEW COMPARISON:  Portable exam 0842 hours compared to 01/20/2017 FINDINGS: Normal heart size, mediastinal contours, and pulmonary vascularity. Minimal bibasilar atelectasis. Lungs otherwise clear. No acute infiltrate, pleural effusion or pneumothorax. Osseous structures unremarkable. IMPRESSION: Minimal bibasilar atelectasis. Electronically Signed   By: Lavonia Dana M.D.   On: 03/13/2021 08:50   ECHOCARDIOGRAM COMPLETE  Result Date: 03/13/2021    ECHOCARDIOGRAM REPORT   Patient Name:   Wayne Spencer Date of Exam: 03/13/2021 Medical Rec #:  [id number]      Height:       75.5 in Accession #:    [id number]     Weight:       330.0 lb Date of Birth:  03-31-1971      BSA:          2.728 m Patient Age:    59 years       BP:           115/70 mmHg Patient Gender: M              HR:           66 bpm. Exam Location:  Inpatient Procedure: 2D Echo, Cardiac Doppler and Color Doppler Indications:    Acute myocardial infarction, unspecified I21.9  History:        Patient has prior history of Echocardiogram examinations, most                 recent 08/10/2013. CAD; Risk Factors:Current Smoker,                 Hypertension, Dyslipidemia and Diabetes.  Sonographer:    Bernadene Person RDCS Referring Phys: [phone] DAVID W [facility]  Sonographer Comments: Patient is morbidly obese.  Image acquisition challenging due to respiratory motion. IMPRESSIONS  1. Limited study due to poor acoustic windows. Left ventricular ejection fraction, by estimation, is 55 to 60%. The left ventricle has normal function. Wall motion difficult to assess due to poor definition of the endocardial borders. Based on limited views, there appears to be hypokinesis of the basal inferior and basal inferolateral walls.The left ventricular internal cavity size was mildly dilated. Left ventricular diastolic parameters were normal.  2. Right ventricular systolic function is normal. The right ventricular size is normal.  3. The mitral valve is normal in structure. Trivial mitral valve regurgitation. No evidence of mitral stenosis.  4. The aortic valve is tricuspid. Aortic valve regurgitation is not visualized. No aortic stenosis is present.  5. Aortic dilatation noted. There is mild dilatation of the aortic root, measuring 40 mm.  6. The inferior vena cava is dilated in size with >50% respiratory variability, suggesting right atrial pressure of 8 mmHg. Comparison(s): Compared to prior echo report in 2014, the EF appears to now be around 55% (previously reported at 45%). The basal inferolateral WMA is new but the inferobasal WMA was previously noted. FINDINGS  Left Ventricle: Left ventricular ejection fraction, by estimation, is 55 to 60%. The left ventricle has normal function. Wall motion difficult to assess due to poor definition of the endocardial borders. Based on limited views, there appears to be hypokinesis of the basal inferior and basal inferolateral walls. The left ventricular internal cavity size was mildly dilated. There is no left ventricular hypertrophy. Left ventricular diastolic parameters were normal. Right  Ventricle: The right ventricular size is normal. No increase in right ventricular wall thickness. Right ventricular systolic function is normal. Left Atrium: Left atrial size was normal in size. Right Atrium:  Right atrial size was normal in size. Pericardium: There is no evidence of pericardial effusion. Mitral Valve: The mitral valve is normal in structure. Trivial mitral valve regurgitation. No evidence of mitral valve stenosis. Tricuspid Valve: The tricuspid valve is normal in structure. Tricuspid valve regurgitation is trivial. Aortic Valve: The aortic valve is tricuspid. Aortic valve regurgitation is not visualized. No aortic stenosis is present. Pulmonic Valve: The pulmonic valve was not well visualized. Pulmonic valve regurgitation is not visualized. Aorta: Aortic dilatation noted. There is mild dilatation of the aortic root, measuring 40 mm. Venous: The inferior vena cava is dilated in size with greater than 50% respiratory variability, suggesting right atrial pressure of 8 mmHg. IAS/Shunts: No atrial level shunt detected by color flow Doppler.  LEFT VENTRICLE PLAX 2D LVIDd:         6.50 cm  Diastology LVIDs:         4.00 cm  LV e' medial:    11.20 cm/s LV PW:         0.90 cm  LV E/e' medial:  9.8 LV IVS:        1.10 cm  LV e' lateral:   12.70 cm/s LVOT diam:     2.10 cm  LV E/e' lateral: 8.7 LV SV:         111 LV SV Index:   41 LVOT Area:     3.46 cm  RIGHT VENTRICLE RV S prime:     15.70 cm/s TAPSE (M-mode): 2.3 cm LEFT ATRIUM             Index       RIGHT ATRIUM           Index LA diam:        4.70 cm 1.72 cm/m  RA Area:     19.30 cm LA Vol (A2C):   65.1 ml 23.86 ml/m RA Volume:   45.40 ml  16.64 ml/m LA Vol (A4C):   70.3 ml 25.77 ml/m LA Biplane Vol: 71.1 ml 26.06 ml/m  AORTIC VALVE LVOT Vmax:   140.00 cm/s LVOT Vmean:  93.900 cm/s LVOT VTI:    0.321 m  AORTA Ao Root diam: 4.00 cm Ao Asc diam:  3.30 cm MITRAL VALVE MV Area (PHT): 2.56 cm     SHUNTS MV Decel Time: 296 msec     Systemic VTI:  0.32 m MV E velocity: 110.00 cm/s  Systemic Diam: 2.10 cm MV A velocity: 54.70 cm/s MV E/A ratio:  2.01 Gwyndolyn Kaufman MD Electronically signed by Gwyndolyn Kaufman MD Signature Date/Time: 03/13/2021/4:37:19 PM     Final    Disposition   Pt is being discharged home today in good condition.  Follow-up Plans & Appointments     Follow-up Information    [facility], [place]. Go on 03/28/2021.   Specialties: Physician Assistant, Cardiology Why: '@2'$ :30pm for hospital follow up with Dr. Nelly Laurence PA Contact information: [place] [id number] [phone]              Discharge Instructions    AMB Referral to [facility]   Complete by: As directed    Reason For Referral: Lipids   Amb Referral to Cardiac Rehabilitation   Complete by: As directed    Diagnosis:  Coronary Stents STEMI  After initial evaluation and assessments completed: Virtual Based Care may be provided alone or in conjunction with Phase 2 Cardiac Rehab based on patient barriers.: Yes   Diet - low sodium heart healthy   Complete by: As directed    Discharge instructions   Complete by: As directed    No driving for 2 weeks. No lifting over 10 lbs for 4 weeks. No sexual activity for 4 weeks. You may not return to work until cleared by your cardiologist. Keep procedure site clean & dry. If you notice increased pain, swelling, bleeding or pus, call/return!  You may shower, but no soaking baths/hot tubs/pools for 1 week.   Increase activity slowly   Complete by: As directed       Discharge Medications   Allergies as of 03/15/2021      Reactions   Mobic [meloxicam]    Unknown    Penicillins Hives   Tramadol    REACTION: itching, breaking out.      Medication List    TAKE these medications   acetaminophen 500 MG tablet Commonly known as: TYLENOL Take 1,000 mg by mouth every 6 (six) hours as needed for headache.   albuterol 108 (90 Base) MCG/ACT inhaler Commonly known as: VENTOLIN HFA Inhale 1-2 puffs into the lungs every 6 (six) hours as needed for wheezing or shortness of breath.   ALPRAZolam 1 MG tablet Commonly known as: XANAX Take 1 mg by mouth 3 (three) times daily as needed for  anxiety.   amLODipine 10 MG tablet Commonly known as: NORVASC Take 1 tablet (10 mg total) by mouth daily.   aspirin 81 MG EC tablet Take 1 tablet (81 mg total) by mouth daily.   cyclobenzaprine 10 MG tablet Commonly known as: FLEXERIL Take 10 mg by mouth 3 (three) times daily as needed for muscle spasms.   ezetimibe 10 MG tablet Commonly known as: ZETIA Take 1 tablet (10 mg total) by mouth daily.   furosemide 40 MG tablet Commonly known as: LASIX TAKE 40 MG DAILY. MAY TAKE AN ADDITIONAL 20 MG AS NEEDED FOR SEVERE LEG SWELLING. What changed:   how much to take  how to take this  when to take this  additional instructions   hydrALAZINE 50 MG tablet Commonly known as: APRESOLINE Take 1 tablet (50 mg total) by mouth 3 (three) times daily. What changed:   medication strength  how much to take   HYDROcodone-acetaminophen 7.5-325 MG tablet Commonly known as: NORCO Take 1 tablet by mouth every 4 (four) hours as needed for pain.   isosorbide mononitrate 30 MG 24 hr tablet Commonly known as: IMDUR Take 30 mg by mouth daily.   losartan 50 MG tablet Commonly known as: COZAAR Take 1 tablet (50 mg total) by mouth daily.   metoprolol succinate 100 MG 24 hr tablet Commonly known as: TOPROL-XL TAKE (1) TABLET BY MOUTH IN THE MORNING TAKE (1) TABLET AT BEDTIME TAKE WITH OR IMMEDIATELY FOLLOWING A MEAL. What changed: See the new instructions.   nitroGLYCERIN 0.4 MG SL tablet Commonly known as: Nitrostat Place 1 tablet (0.4 mg total) under the tongue every 5 (five) minutes as needed for chest pain.   pantoprazole 40 MG tablet Commonly known as: PROTONIX Take 40 mg by mouth daily.   potassium chloride SA 20 MEQ tablet Commonly known as: KLOR-CON TAKE (1) TABLET BY MOUTH TWICE DAILY. What changed: See the new instructions.   rosuvastatin 40 MG tablet Commonly known as: CRESTOR Take 40 mg by mouth  daily.   ticagrelor 90 MG Tabs tablet Commonly known as:  BRILINTA Take 1 tablet (90 mg total) by mouth 2 (two) times daily.          Outstanding Labs/Studies   BMP at follow up   Duration of Discharge Encounter   Greater than 30 minutes including physician time.  SignedLeanor Kail, PA 03/15/2021, 11:15 AM  I have personally seen and examined this patient. I agree with the assessment and plan as outlined above. See my full note this am. Discharge today.   Lauree Chandler 03/15/2021 11:52 AM'

## 2021-03-15 NOTE — Discharge Instructions (Signed)

## 2021-03-15 NOTE — Progress Notes (Addendum)
Progress Note  Patient Name: Wayne Spencer Date of Encounter: 03/15/2021  [facility] Cardiologist: Carlyle Dolly, MD   Subjective   No chest pain or dyspnea this am  Inpatient Medications    Scheduled Meds: . amLODipine  10 mg Oral Daily  . aspirin EC  81 mg Oral Daily  . Chlorhexidine Gluconate Cloth  6 each Topical Daily  . ezetimibe  10 mg Oral Daily  . hydrALAZINE  100 mg Oral TID  .  HYDROmorphone (DILAUDID) injection  1 mg Intravenous Once  . losartan  50 mg Oral Daily  . metoprolol succinate  100 mg Oral Daily  . rosuvastatin  40 mg Oral Daily  . sodium chloride flush  3 mL Intravenous Q12H  . ticagrelor  90 mg Oral BID   Continuous Infusions: . sodium chloride 20 mL/hr at 03/13/21 0841  . sodium chloride     PRN Meds: sodium chloride, acetaminophen, albuterol, ALPRAZolam, cyclobenzaprine, HYDROcodone-acetaminophen, nitroGLYCERIN, ondansetron (ZOFRAN) IV, sodium chloride flush   Vital Signs    Vitals:   03/14/21 2223 03/15/21 0000 03/15/21 0400 03/15/21 0727  BP: 115/74 124/66 (!) 95/58   Pulse:      Resp:  14 19   Temp:   98.9 F (37.2 C) 98.4 F (36.9 C)  TempSrc:   Oral Oral  SpO2:      Weight:      Height:        Intake/Output Summary (Last 24 hours) at 03/15/2021 0807 Last data filed at 03/15/2021 0700 Gross per 24 hour  Intake 720 ml  Output 3000 ml  Net -2280 ml   Last 3 Weights 03/13/2021 10/19/2020 09/12/2020  Weight (lbs) 330 lb 330 lb 331 lb  Weight (kg) 149.687 kg 149.687 kg 150.141 kg      Telemetry    Sinus tachycardia - Personally Reviewed  ECG    No AM EKG - Personally Reviewed  Physical Exam   General: Well developed, well nourished, NAD  HEENT: OP clear, mucus membranes moist  SKIN: warm, dry. No rashes. Neuro: No focal deficits  Musculoskeletal: Muscle strength 5/5 all ext  Psychiatric: Mood and affect normal  Neck: No JVD, no carotid bruits, no thyromegaly, no lymphadenopathy.  Lungs:Clear bilaterally, no  wheezes, rhonci, crackles Cardiovascular: Regular rate and rhythm. No murmurs, gallops or rubs. Abdomen:Soft. Bowel sounds present. Non-tender.  Extremities: No lower extremity edema. Pulses are 2 + in the bilateral DP/PT.   Labs    High Sensitivity Troponin:   Recent Labs  Lab 03/13/21 0843 03/13/21 1148 03/13/21 1410 03/13/21 1752  TROPONINIHS 630* 766* 1,179* 4,679*      Chemistry Recent Labs  Lab 03/13/21 0843 03/14/21 0110 03/15/21 0041  NA 134* 138 136  K 3.4* 3.5 3.6  CL 103 107 106  CO2 '23 22 26  '$ GLUCOSE 157* 100* 106*  BUN '18 12 14  '$ CREATININE 2.09* 1.97* 2.18*  CALCIUM 9.4 10.0 9.6  PROT 6.4*  --   --   ALBUMIN 3.3*  --   --   AST 30  --   --   ALT 30  --   --   ALKPHOS 60  --   --   BILITOT 0.6  --   --   GFRNONAA 38* 41* 36*  ANIONGAP 8 9 4*     Hematology Recent Labs  Lab 03/13/21 0843 03/14/21 0110  WBC 7.8 13.0*  RBC 4.02* 4.08*  HGB 13.4 13.7  HCT 39.9 40.2  MCV 99.3 98.5  MCH 33.3 33.6  MCHC 33.6 34.1  RDW 12.8 12.8  PLT 178 179    BNPNo results for input(s): BNP, PROBNP in the last 168 hours.   DDimer No results for input(s): DDIMER in the last 168 hours.   Radiology    CARDIAC CATHETERIZATION  Result Date: [id number]  LV end diastolic pressure is mildly elevated.  There is no aortic valve stenosis.  -----------------------------------------------------------  RPAV lesion is 60% stenosed with 100% stenosed (culprit lesion) side branch in 2nd RPL.  Balloon angioplasty was performed on the follow-up on the vessel into PL 3 to restore flow) using a BALLOON SAPPHIRE 2.0X12.  Post intervention, there is a 30% residual stenosis leading into PL 3. Post intervention, the side branch remained 100% stenosed  Mid RCA to Dist RCA stent is 5% stenosed.  Ost RCA lesion is 30% stenosed.  ------------------------------------------------------------  Prox LAD lesion is 45% stenosed with 30% stenosed side branch in 1st Diag.  Mid LAD lesion  is 30% stenosed with 50% stenosed side branch in 2nd Diag.  Ost Cx to Prox Cx lesion is 25% stenosed.  SUMMARY  Single-vessel disease with widely patent stent in the distal RCA, culprit lesion is 100% flush occlusion of RPL 2 (unable to engage with wire), 80% stenosis of follow-on RPA V into RPL 3 (successful balloon PTCA only) =>  very difficult vessel to wire, very tortuous and extremely distal.  Poor visualization due to body habitus.  Therefore chose to do PTCA only and allow for completion of the infarct-related artery closure.  Due to the patient's renal insufficiency and very large patulous vessels requiring  large volumes of contrast to fill and requirement for frequent angiography, chose to stop at this point.  Diffuse mild to moderate disease in the LAD with most prominent lesion being 50 % lesion in the ostial D2. RECOMMENDATIONS:  Overnight in CCU-we will do 3 hours IV Aggrastat to help distal flow.  Check 2D echo in the morning; cycle troponins  [facility] consult for the morning.  Has been loaded with Brilinta, continue aspirin and Brilinta for ACS x1 year.  (Would likely be able to hold after 6 months)  Continue aggressive risk factor modification, has multiple medications already listed.  Will adjust as BP and labs tolerate.  Expect if he remains comfortable in the next day or so he can probably discharge home after 48 hours. Glenetta Hew, MD  DG Chest Port 1 View  Result Date: 03/13/2021 CLINICAL DATA:  Sudden onset chest pain this morning, STEMI, hypertension, coronary artery disease, smoker EXAM: PORTABLE CHEST 1 VIEW COMPARISON:  Portable exam 0842 hours compared to 01/20/2017 FINDINGS: Normal heart size, mediastinal contours, and pulmonary vascularity. Minimal bibasilar atelectasis. Lungs otherwise clear. No acute infiltrate, pleural effusion or pneumothorax. Osseous structures unremarkable. IMPRESSION: Minimal bibasilar atelectasis. Electronically Signed   By: Lavonia Dana M.D.   On:  03/13/2021 08:50   ECHOCARDIOGRAM COMPLETE  Result Date: 03/13/2021    ECHOCARDIOGRAM REPORT   Patient Name:   Wayne Spencer Date of Exam: 03/13/2021 Medical Rec #:  [id number]      Height:       75.5 in Accession #:    [id number]     Weight:       330.0 lb Date of Birth:  08/05/1971      BSA:          2.728 m Patient Age:    9 years       BP:  115/70 mmHg Patient Gender: M              HR:           66 bpm. Exam Location:  Inpatient Procedure: 2D Echo, Cardiac Doppler and Color Doppler Indications:    Acute myocardial infarction, unspecified I21.9  History:        Patient has prior history of Echocardiogram examinations, most                 recent 08/10/2013. CAD; Risk Factors:Current Smoker,                 Hypertension, Dyslipidemia and Diabetes.  Sonographer:    Bernadene Person RDCS Referring Phys: [phone] [facility]  Sonographer Comments: Patient is morbidly obese. Image acquisition challenging due to respiratory motion. IMPRESSIONS  1. Limited study due to poor acoustic windows. Left ventricular ejection fraction, by estimation, is 55 to 60%. The left ventricle has normal function. Wall motion difficult to assess due to poor definition of the endocardial borders. Based on limited views, there appears to be hypokinesis of the basal inferior and basal inferolateral walls.The left ventricular internal cavity size was mildly dilated. Left ventricular diastolic parameters were normal.  2. Right ventricular systolic function is normal. The right ventricular size is normal.  3. The mitral valve is normal in structure. Trivial mitral valve regurgitation. No evidence of mitral stenosis.  4. The aortic valve is tricuspid. Aortic valve regurgitation is not visualized. No aortic stenosis is present.  5. Aortic dilatation noted. There is mild dilatation of the aortic root, measuring 40 mm.  6. The inferior vena cava is dilated in size with >50% respiratory variability, suggesting right atrial pressure of 8  mmHg. Comparison(s): Compared to prior echo report in 2014, the EF appears to now be around 55% (previously reported at 45%). The basal inferolateral WMA is new but the inferobasal WMA was previously noted. FINDINGS  Left Ventricle: Left ventricular ejection fraction, by estimation, is 55 to 60%. The left ventricle has normal function. Wall motion difficult to assess due to poor definition of the endocardial borders. Based on limited views, there appears to be hypokinesis of the basal inferior and basal inferolateral walls. The left ventricular internal cavity size was mildly dilated. There is no left ventricular hypertrophy. Left ventricular diastolic parameters were normal. Right Ventricle: The right ventricular size is normal. No increase in right ventricular wall thickness. Right ventricular systolic function is normal. Left Atrium: Left atrial size was normal in size. Right Atrium: Right atrial size was normal in size. Pericardium: There is no evidence of pericardial effusion. Mitral Valve: The mitral valve is normal in structure. Trivial mitral valve regurgitation. No evidence of mitral valve stenosis. Tricuspid Valve: The tricuspid valve is normal in structure. Tricuspid valve regurgitation is trivial. Aortic Valve: The aortic valve is tricuspid. Aortic valve regurgitation is not visualized. No aortic stenosis is present. Pulmonic Valve: The pulmonic valve was not well visualized. Pulmonic valve regurgitation is not visualized. Aorta: Aortic dilatation noted. There is mild dilatation of the aortic root, measuring 40 mm. Venous: The inferior vena cava is dilated in size with greater than 50% respiratory variability, suggesting right atrial pressure of 8 mmHg. IAS/Shunts: No atrial level shunt detected by color flow Doppler.  LEFT VENTRICLE PLAX 2D LVIDd:         6.50 cm  Diastology LVIDs:         4.00 cm  LV e' medial:    11.20 cm/s LV  PW:         0.90 cm  LV E/e' medial:  9.8 LV IVS:        1.10 cm  LV e'  lateral:   12.70 cm/s LVOT diam:     2.10 cm  LV E/e' lateral: 8.7 LV SV:         111 LV SV Index:   41 LVOT Area:     3.46 cm  RIGHT VENTRICLE RV S prime:     15.70 cm/s TAPSE (M-mode): 2.3 cm LEFT ATRIUM             Index       RIGHT ATRIUM           Index LA diam:        4.70 cm 1.72 cm/m  RA Area:     19.30 cm LA Vol (A2C):   65.1 ml 23.86 ml/m RA Volume:   45.40 ml  16.64 ml/m LA Vol (A4C):   70.3 ml 25.77 ml/m LA Biplane Vol: 71.1 ml 26.06 ml/m  AORTIC VALVE LVOT Vmax:   140.00 cm/s LVOT Vmean:  93.900 cm/s LVOT VTI:    0.321 m  AORTA Ao Root diam: 4.00 cm Ao Asc diam:  3.30 cm MITRAL VALVE MV Area (PHT): 2.56 cm     SHUNTS MV Decel Time: 296 msec     Systemic VTI:  0.32 m MV E velocity: 110.00 cm/s  Systemic Diam: 2.10 cm MV A velocity: 54.70 cm/s MV E/A ratio:  2.01 Gwyndolyn Kaufman MD Electronically signed by Gwyndolyn Kaufman MD Signature Date/Time: 03/13/2021/4:37:19 PM    Final     Cardiac Studies   See above  Patient Profile     50 y.o. male with history of CAD, HTN, HLD, tobacco abuse admitted with inferior STEMI  Assessment & Plan    1. CAD/Inferior STEMI: Cardiac cath 03/13/21. Inferior STEMI due to occlusion of small posterolateral branch of the distal RCA. Unable to open branch. No chest pain. Will plan medical management of CAD. LVEF=55-60% by echo 03/13/21.  -Will continue DAPT with ASA and Brilinta for one year.  -Continue beta blocker, statin and Zetia.     2. HTN: BP stable. Will lower hydralazine to 50 mg po TID  3. HLD: continue statin and Zetia. Will refer to lipid clinic.   4. Tobacco abuse: Smoking cessation counseling provided  5. CKD, stage 3: Renal function stable post cath with baseline around 2.0-2.2.   6. Hypokalemia: replace this am.   Discharge home today. Follow up with Dr. Harl Bowie.    For questions or updates, please contact [place] Please consult [email or web address] for contact info under        Signed, Lauree Chandler, MD   03/15/2021, 8:07 AM

## 2021-03-21 ENCOUNTER — Telehealth: Payer: Self-pay | Admitting: *Deleted

## 2021-03-21 NOTE — Telephone Encounter (Signed)
Pt calling to say that he is feeling great and would like to know when he can start back driving. Please advise.

## 2021-03-21 NOTE — Telephone Encounter (Signed)
Discharge instructions were for 2 weeks which would also be right at his f/u appt with [place], would wait until that time   Zandra Abts MD

## 2021-03-21 NOTE — Telephone Encounter (Signed)
Returned call to pt. No answer. Left msg to call back.  

## 2021-03-22 NOTE — Telephone Encounter (Signed)
Pt notified and voiced understanding 

## 2021-03-28 ENCOUNTER — Ambulatory Visit (INDEPENDENT_AMBULATORY_CARE_PROVIDER_SITE_OTHER): Payer: Medicaid Other | Admitting: Student

## 2021-03-28 ENCOUNTER — Other Ambulatory Visit: Payer: Self-pay

## 2021-03-28 ENCOUNTER — Encounter: Payer: Self-pay | Admitting: Student

## 2021-03-28 VITALS — BP 118/76 | HR 82 | Ht 75.5 in | Wt 330.0 lb

## 2021-03-28 DIAGNOSIS — E785 Hyperlipidemia, unspecified: Secondary | ICD-10-CM

## 2021-03-28 DIAGNOSIS — I2510 Atherosclerotic heart disease of native coronary artery without angina pectoris: Secondary | ICD-10-CM

## 2021-03-28 DIAGNOSIS — N1832 Chronic kidney disease, stage 3b: Secondary | ICD-10-CM

## 2021-03-28 DIAGNOSIS — Z8679 Personal history of other diseases of the circulatory system: Secondary | ICD-10-CM

## 2021-03-28 DIAGNOSIS — Z79899 Other long term (current) drug therapy: Secondary | ICD-10-CM | POA: Diagnosis not present

## 2021-03-28 DIAGNOSIS — I10 Essential (primary) hypertension: Secondary | ICD-10-CM

## 2021-03-28 DIAGNOSIS — Z720 Tobacco use: Secondary | ICD-10-CM

## 2021-03-28 NOTE — Progress Notes (Signed)
Cardiology Office Note    Date:  03/28/2021   ID:  Wayne Spencer, DOB 01/10/1971, MRN [id number]  PCP:  Celene Squibb, MD  Cardiologist: Carlyle Dolly, MD    Chief Complaint  Patient presents with  . Hospitalization Follow-up    History of Present Illness:    Wayne Spencer is a 50 y.o. male with past medical history of CAD (s/p DES to RCA in 2014), mild ischemic cardiomyopathy (EF 45-50% by echo in 2014), nephrotic syndrome, Stage 3 CKD, HTN, HLD, COPD and tobacco usewho presents to the office today for hospital follow-up.   He recently presented to [facility] ED on 03/13/2021 for evaluation of left-sided chest pain with associated dyspnea, nausea and dizziness.  Initial EKG showed subtle 1 mm ST elevation along the inferior leads and code STEMI was activated.  His catheterization showed single-vessel disease with widely patent distal RCA stent and the culprit lesion was 100% occlusion of RPL 2 and balloon angioplasty was performed and there was 30% residual stenosis leading into the PL3 and the sidebranch remained 100% stenosed. He had nonobstructive disease along the LAD and LCx. His Hs Troponin values peaked at 4679 and echocardiogram showed a preserved EF of 55 to 60% and he did have hypokinesis of the basal inferior and basal inferior lateral walls.  It was recommended he continue DAPT with ASA and Brilinta for 1 year along with being on beta-blocker and statin therapy. His creatinine did peak at 2.18 and a follow-up BMET was recommended at his outpatient visit.   In talking with the patient and his wife today, he reports overall doing well since his recent hospitalization. He has not experienced pain resembling what brought him to the ED earlier this month. He does have baseline dyspnea on exertion in the setting of tobacco use but denies any acute changes in this. No recent palpitations, orthopnea, PND or pitting edema. He has experienced a headache since hospital discharge and  questions if this is secondary to [place].  Past Medical History:  Diagnosis Date  . CAD (coronary artery disease)    a. s/p DES to RCA in 2014 b. 03/2021: s/p STEMI: widely patent distal RCA stent and 100% occlusion of RPL 2 and balloon angioplasty was performed and there was 30% residual stenosis leading into the PL3 and sidebranch remained 100% stenosed. Nonobstructive disease along the LAD and LCx  . Cellulitis   . Chronic headache   . Hyperlipidemia   . Hyperlipidemia associated with type 2 diabetes mellitus ([place])   . Hypertension   . Nephrotic syndrome   . Renal insufficiency   . Tobacco abuse   . Umbilical hernia     Past Surgical History:  Procedure Laterality Date  . CORONARY STENT PLACEMENT    . CORONARY/GRAFT ACUTE MI REVASCULARIZATION N/A 03/13/2021   Procedure: Coronary/Graft Acute MI Revascularization;  Surgeon: Leonie Man, MD;  Location: [facility];  Service: Cardiovascular;  Laterality: N/A;  . LEFT HEART CATH AND CORONARY ANGIOGRAPHY N/A 03/13/2021   Procedure: LEFT HEART CATH AND CORONARY ANGIOGRAPHY;  Surgeon: Leonie Man, MD;  Location: [facility];  Service: Cardiovascular;  Laterality: N/A;  . LEFT HEART CATHETERIZATION WITH CORONARY ANGIOGRAM N/A 08/10/2013   Procedure: LEFT HEART CATHETERIZATION WITH CORONARY ANGIOGRAM;  Surgeon: [name], MD;  Location: [facility] C[facility] LAB;  Service: Cardiovascular;  Laterality: N/A;  . PERCUTANEOUS CORONARY STENT INTERVENTION (PCI-S) N/A 08/11/2013   Procedure: PERCUTANEOUS CORONARY STENT INTERVENTION (PCI-S);  Surgeon: Blane Ohara, MD;  Location: [facility] CATH LAB;  Service: Cardiovascular;  Laterality: N/A;    Current Medications: Outpatient Medications Prior to Visit  Medication Sig Dispense Refill  . acetaminophen (TYLENOL) 500 MG tablet Take 1,000 mg by mouth every 6 (six) hours as needed for headache.    . albuterol (PROVENTIL HFA;VENTOLIN HFA) 108 (90 Base) MCG/ACT inhaler Inhale 1-2 puffs into  the lungs every 6 (six) hours as needed for wheezing or shortness of breath.    . ALPRAZolam (XANAX) 1 MG tablet Take 1 mg by mouth 3 (three) times daily as needed for anxiety.    Marland Kitchen amLODipine (NORVASC) 10 MG tablet Take 1 tablet (10 mg total) by mouth daily. 180 tablet 3  . aspirin EC 81 MG EC tablet Take 1 tablet (81 mg total) by mouth daily.    . cyclobenzaprine (FLEXERIL) 10 MG tablet Take 10 mg by mouth 3 (three) times daily as needed for muscle spasms.    Marland Kitchen ezetimibe (ZETIA) 10 MG tablet Take 1 tablet (10 mg total) by mouth daily. 90 tablet 3  . furosemide (LASIX) 40 MG tablet TAKE 40 MG DAILY. MAY TAKE AN ADDITIONAL 20 MG AS NEEDED FOR SEVERE LEG SWELLING. (Patient taking differently: Take 40 mg by mouth daily. May take additional '20mg'$  as needed for leg swelling) 90 tablet 3  . hydrALAZINE (APRESOLINE) 50 MG tablet Take 1 tablet (50 mg total) by mouth 3 (three) times daily. 90 tablet 3  . HYDROcodone-acetaminophen (NORCO) 7.5-325 MG per tablet Take 1 tablet by mouth every 4 (four) hours as needed for pain.    . isosorbide mononitrate (IMDUR) 30 MG 24 hr tablet Take 30 mg by mouth daily.    Marland Kitchen losartan (COZAAR) 50 MG tablet Take 1 tablet (50 mg total) by mouth daily. 90 tablet 3  . metoprolol succinate (TOPROL-XL) 100 MG 24 hr tablet TAKE (1) TABLET BY MOUTH IN THE MORNING TAKE (1) TABLET AT BEDTIME TAKE WITH OR IMMEDIATELY FOLLOWING A MEAL. (Patient taking differently: Take 100 mg by mouth in the morning and at bedtime.) 180 tablet 2  . nitroGLYCERIN (NITROSTAT) 0.4 MG SL tablet Place 1 tablet (0.4 mg total) under the tongue every 5 (five) minutes as needed for chest pain. 25 tablet 3  . pantoprazole (PROTONIX) 40 MG tablet Take 40 mg by mouth daily.    . potassium chloride SA (KLOR-CON) 20 MEQ tablet TAKE (1) TABLET BY MOUTH TWICE DAILY. (Patient taking differently: Take 20 mEq by mouth 2 (two) times daily.) 180 tablet 3  . rosuvastatin (CRESTOR) 40 MG tablet Take 40 mg by mouth daily.    .  ticagrelor (BRILINTA) 90 MG TABS tablet Take 1 tablet (90 mg total) by mouth 2 (two) times daily. 60 tablet 11   No facility-administered medications prior to visit.     Allergies:   Mobic [meloxicam], Penicillins, and Tramadol   Social History   Socioeconomic History  . Marital status: Married    Spouse name: Not on file  . Number of children: Not on file  . Years of education: Not on file  . Highest education level: Not on file  Occupational History  . Not on file  Tobacco Use  . Smoking status: Current Every Day Smoker    Packs/day: 0.50    Years: 6.00    Pack years: 3.00    Types: Cigarettes    Start date: 02/19/1985  . Smokeless tobacco: Never Used  Vaping Use  . Vaping Use: Never used  Substance and Sexual Activity  . Alcohol use: Not Currently    Alcohol/week: 0.0 standard drinks    Comment: occasional  . Drug use: No  . Sexual activity: Yes    Partners: Female  Other Topics Concern  . Not on file  Social History Narrative   Lives in [place], [place]. Two children. Works as a Therapist, art and helps change tires.    Social Determinants of Health   Financial Resource Strain: Not on file  Food Insecurity: Not on file  Transportation Needs: Not on file  Physical Activity: Not on file  Stress: Not on file  Social Connections: Not on file     Family History:  The patient's family history includes CAD in his brother and mother; Diabetes in his brother; Heart attack in his brother, father, and sister; Kidney failure in his sister.   Review of Systems:    Please see the history of present illness.     All other systems reviewed and are otherwise negative except as noted above.   Physical Exam:    VS:  BP 118/76   Pulse 82   Ht 6' 3.5" (1.918 m)   Wt (!) 330 lb (149.7 kg)   SpO2 96%   BMI 40.70 kg/m    General: Well developed, well nourished,male appearing in no acute distress. Head: Normocephalic, atraumatic. Neck: No carotid bruits. JVD not elevated.   Lungs: Respirations regular and unlabored, without wheezes or rales.  Heart: Regular rate and rhythm. No S3 or S4.  No murmur, no rubs, or gallops appreciated. Abdomen: Appears non-distended. No obvious abdominal masses. Msk:  Strength and tone appear normal for age. No obvious joint deformities or effusions. Extremities: No clubbing or cyanosis. No edema.  Distal pedal pulses are 2+ bilaterally. Radial cath site stable without ecchymosis or evidence of a hematoma.  Neuro: Alert and oriented X 3. Moves all extremities spontaneously. No focal deficits noted. Psych:  Responds to questions appropriately with a normal affect. Skin: No rashes or lesions noted  Wt Readings from Last 3 Encounters:  03/28/21 (!) 330 lb (149.7 kg)  03/13/21 (!) 330 lb (149.7 kg)  10/19/20 (!) 330 lb (149.7 kg)     Studies/Labs Reviewed:   EKG:  EKG is not ordered today.   Recent Labs: 12/07/2020: TSH 4.006 03/13/2021: ALT 30 03/14/2021: Hemoglobin 13.7; Platelets 179 03/15/2021: BUN 14; Creatinine, Ser 2.18; Potassium 3.6; Sodium 136   Lipid Panel    Component Value Date/Time   CHOL 138 03/13/2021 0844   TRIG 219 (H) 03/13/2021 0844   HDL 29 (L) 03/13/2021 0844   CHOLHDL 4.8 03/13/2021 0844   VLDL 44 (H) 03/13/2021 0844   LDLCALC 65 03/13/2021 0844    Additional studies/ records that were reviewed today include:   Cardiac Catheterization: [id number]  LV end diastolic pressure is mildly elevated.  There is no aortic valve stenosis.  -----------------------------------------------------------  RPAV lesion is 60% stenosed with 100% stenosed (culprit lesion) side branch in 2nd RPL.  Balloon angioplasty was performed on the follow-up on the vessel into PL 3 to restore flow) using a BALLOON SAPPHIRE 2.0X12.  Post intervention, there is a 30% residual stenosis leading into PL 3. Post intervention, the side branch remained 100% stenosed  Mid RCA to Dist RCA stent is 5% stenosed.  Ost RCA lesion is 30%  stenosed.  ------------------------------------------------------------  Prox LAD lesion is 45% stenosed with 30% stenosed side branch in 1st Diag.  Mid LAD lesion is 30% stenosed with 50% stenosed side  branch in 2nd Diag.  Ost Cx to Prox Cx lesion is 25% stenosed.   SUMMARY  Single-vessel disease with widely patent stent in the distal RCA, culprit lesion is 100% flush occlusion of RPL 2 (unable to engage with wire), 80% stenosis of follow-on RPA V into RPL 3 (successful balloon PTCA only) => ? very difficult vessel to wire, very tortuous and extremely distal.  Poor visualization due to body habitus.  Therefore chose to do PTCA only and allow for completion of the infarct-related artery closure. ? Due to the patient's renal insufficiency and very large patulous vessels requiring  large volumes of contrast to fill and requirement for frequent angiography, chose to stop at this point.  Diffuse mild to moderate disease in the LAD with most prominent lesion being 50 % lesion in the ostial D2.    RECOMMENDATIONS:  Overnight in CCU-we will do 3 hours IV Aggrastat to help distal flow.  Check 2D echo in the morning; cycle troponins  CRH consult for the morning.  Has been loaded with Brilinta, continue aspirin and Brilinta for ACS x1 year.  (Would likely be able to hold after 6 months)  Continue aggressive risk factor modification, has multiple medications already listed.  Will adjust as BP and labs tolerate.  Expect if he remains comfortable in the next day or so he can probably discharge home after 48 hours.    Echocardiogram: 03/2021 IMPRESSIONS    1. Limited study due to poor acoustic windows. Left ventricular ejection  fraction, by estimation, is 55 to 60%. The left ventricle has normal  function. Wall motion difficult to assess due to poor definition of the  endocardial borders. Based on limited  views, there appears to be hypokinesis of the basal inferior and basal   inferolateral walls.The left ventricular internal cavity size was mildly  dilated. Left ventricular diastolic parameters were normal.  2. Right ventricular systolic function is normal. The right ventricular  size is normal.  3. The mitral valve is normal in structure. Trivial mitral valve  regurgitation. No evidence of mitral stenosis.  4. The aortic valve is tricuspid. Aortic valve regurgitation is not  visualized. No aortic stenosis is present.  5. Aortic dilatation noted. 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  6. The inferior vena cava is dilated in size with >50% respiratory  variability, suggesting right atrial pressure of 8 mmHg.   Comparison(s): Compared to prior echo report in 2014, the EF appears to  now be around 55% (previously reported at 45%). The basal inferolateral  WMA is new but the inferobasal WMA was previously noted.   Assessment:    1. Coronary artery disease involving native coronary artery of native heart without angina pectoris   2. Medication management   3. History of ischemic cardiomyopathy   4. Essential hypertension   5. Hyperlipidemia LDL goal <70   6. Stage 3b chronic kidney disease ([place])   7. Tobacco use      Plan:   In order of problems listed above:  1. CAD - He is s/p DES to RCA in 2014 and was recently admitted for a STEMI and cath showed a widely patent distal RCA stent and the culprit lesion was 100% occlusion of RPL 2 and balloon angioplasty was performed and there was 30% residual stenosis leading into the PL3 and the sidebranch remained 100% stenosed.  - He denies any recurrent pain resembling his recent symptoms. Breathing has been stable. He has experienced  a headache since hospital discharge and is concerned this is secondary to [place]. I encouraged him to try to continue Brilinta for a full month and if his headache persists, will switch to Plavix as he tolerated this well in the past. Continue ASA,  Toprol-XL, Crestor, Imdur and Zetia.   2. History of Ischemic Cardiomyopathy - His EF was at 45-50% in 2014 and had normalized to 55-60% by most recent imaging with hypokinesis of the basal inferior and basal inferior lateral walls. - He does not appear volume overloaded by examination. Continue current medication regimen with Losartan '50mg'$  daily, Lasix '40mg'$  daily, Hydralazine '50mg'$  TID, Imdur '30mg'$  daily and Toprol-XL '100mg'$  BID.   3. HTN - BP is well-controlled at 118/76 during today's visit. Continue current medication regimen.   4. HLD - FLP during recent admission showed his LDL was at 65. He remains on Crestor '40mg'$  daily and Zetia '10mg'$  daily.   5. Stage 3 CKD - Creatinine was at 2.18 upon hospital discharge. Will recheck BMET. He is followed by Dr. Moshe Cipro.   6. Tobacco Use - He has reduced his use to 6-10 cigarettes on a daily basis. Congratulated on his reduction with cessation advised.    Medication Adjustments/Labs and Tests Ordered: Current medicines are reviewed at length with the patient today.  Concerns regarding medicines are outlined above.  Medication changes, Labs and Tests ordered today are listed in the Patient Instructions below. Patient Instructions   Medication Instructions:  Your physician recommends that you continue on your current medications as directed. Please refer to the Current Medication list given to you today.  Call if headache continues   *If you need a refill on your cardiac medications before your next appointment, please call your pharmacy*   Lab Work: Your physician recommends that you return for lab work in: Today   If you have labs (blood work) drawn today and your tests are completely normal, you will receive your results only by: Marland Kitchen MyChart Message (if you have MyChart) OR . A paper copy in the mail If you have any lab test that is abnormal or we need to change your treatment, we will call you to review the  results.   Testing/Procedures: NONE   Follow-Up: At [facility], you and your health needs are our priority.  As part of our continuing mission to provide you with exceptional heart care, we have created designated Provider Care Teams.  These Care Teams include your primary Cardiologist (physician) and Advanced Practice Providers (APPs -  Physician Assistants and Nurse Practitioners) who all work together to provide you with the care you need, when you need it.  We recommend signing up for the patient portal called "MyChart".  Sign up information is provided on this After Visit Summary.  MyChart is used to connect with patients for Virtual Visits (Telemedicine).  Patients are able to view lab/test results, encounter notes, upcoming appointments, etc.  Non-urgent messages can be sent to your provider as well.   To learn more about what you can do with MyChart, go to [email or web address].    Your next appointment:    As Planned  The format for your next appointment:   In Person  Provider:   Carlyle Dolly, MD   Other Instructions Thank you for choosing [facility]!     Two Gram Sodium Diet 2000 mg  What is Sodium? Sodium is a mineral found naturally in many foods. The most significant source of sodium in the diet is table  salt, which is about 40% sodium.  Processed, convenience, and preserved foods also contain a large amount of sodium.  The body needs only 500 mg of sodium daily to function,  A normal diet provides more than enough sodium even if you do not use salt.  Why Limit Sodium? A build up of sodium in the body can cause thirst, increased blood pressure, shortness of breath, and water retention.  Decreasing sodium in the diet can reduce edema and risk of heart attack or stroke associated with high blood pressure.  Keep in mind that there are many other factors involved in these health problems.  Heredity, obesity, lack of exercise, cigarette smoking, stress and  what you eat all play a role.  General Guidelines:  Do not add salt at the table or in cooking.  One teaspoon of salt contains over 2 grams of sodium.  Read food labels  Avoid processed and convenience foods  Ask your dietitian before eating any foods not dicussed in the menu planning guidelines  Consult your physician if you wish to use a salt substitute or a sodium containing medication such as antacids.  Limit milk and milk products to 16 oz (2 cups) per day.  Shopping Hints:  READ LABELS!! "Dietetic" does not necessarily mean low sodium.  Salt and other sodium ingredients are often added to foods during processing.   Menu Planning Guidelines Food Group Choose More Often Avoid  Beverages (see also the milk group All fruit juices, low-sodium, salt-free vegetables juices, low-sodium carbonated beverages Regular vegetable or tomato juices, commercially softened water used for drinking or cooking  Breads and Cereals Enriched white, wheat, rye and pumpernickel bread, hard rolls and dinner rolls; muffins, cornbread and waffles; most dry cereals, cooked cereal without added salt; unsalted crackers and breadsticks; low sodium or homemade bread crumbs Bread, rolls and crackers with salted tops; quick breads; instant hot cereals; pancakes; commercial bread stuffing; self-rising flower and biscuit mixes; regular bread crumbs or cracker crumbs  Desserts and Sweets Desserts and sweets mad with mild should be within allowance Instant pudding mixes and cake mixes  Fats Butter or margarine; vegetable oils; unsalted salad dressings, regular salad dressings limited to 1 Tbs; light, sour and heavy cream Regular salad dressings containing bacon fat, bacon bits, and salt pork; snack dips made with instant soup mixes or processed cheese; salted nuts  Fruits Most fresh, frozen and canned fruits Fruits processed with salt or sodium-containing ingredient (some dried fruits are processed with sodium sulfites         Vegetables Fresh, frozen vegetables and low- sodium canned vegetables Regular canned vegetables, sauerkraut, pickled vegetables, and others prepared in brine; frozen vegetables in sauces; vegetables seasoned with ham, bacon or salt pork  Condiments, Sauces, Miscellaneous  Salt substitute with physician's approval; pepper, herbs, spices; vinegar, lemon or lime juice; hot pepper sauce; garlic powder, onion powder, low sodium soy sauce (1 Tbs.); low sodium condiments (ketchup, chili sauce, mustard) in limited amounts (1 tsp.) fresh ground horseradish; unsalted tortilla chips, pretzels, potato chips, popcorn, salsa (1/4 cup) Any seasoning made with salt including garlic salt, celery salt, onion salt, and seasoned salt; sea salt, rock salt, kosher salt; meat tenderizers; monosodium glutamate; mustard, regular soy sauce, barbecue, sauce, chili sauce, teriyaki sauce, steak sauce, Worcestershire sauce, and most flavored vinegars; canned gravy and mixes; regular condiments; salted snack foods, olives, picles, relish, horseradish sauce, catsup   Food preparation: Try these seasonings Meats:    Pork Sage, onion Serve with applesauce  Chicken Poultry seasoning, thyme, parsley Serve with cranberry sauce  Lamb Curry powder, rosemary, garlic, thyme Serve with mint sauce or jelly  Veal Marjoram, basil Serve with current jelly, cranberry sauce  Beef Pepper, bay leaf Serve with dry mustard, unsalted chive butter  Fish [place] leaf, dill Serve with unsalted lemon butter, unsalted parsley butter  Vegetables:    Asparagus Lemon juice   Broccoli Lemon juice   Carrots Mustard dressing parsley, mint, nutmeg, glazed with unsalted butter and sugar   Green beans Marjoram, lemon juice, nutmeg,dill seed   Tomatoes Basil, marjoram, onion   Spice /blend for [employer]" 4 tsp ground thyme 1 tsp ground sage 3 tsp ground rosemary 4 tsp ground marjoram   Test your knowledge 1. A product that says "Salt Free" may  still contain sodium. True or False 2. Garlic Powder and Hot Pepper Sauce an be used as alternative seasonings.True or False 3. Processed foods have more sodium than fresh foods.  True or False 4. Canned Vegetables have less sodium than froze True or False  WAYS TO DECREASE YOUR SODIUM INTAKE 1. Avoid the use of added salt in cooking and at the table.  Table salt (and other prepared seasonings which contain salt) is probably one of the greatest sources of sodium in the diet.  Unsalted foods can gain flavor from the sweet, sour, and butter taste sensations of herbs and spices.  Instead of using salt for seasoning, try the following seasonings with the foods listed.  Remember: how you use them to enhance natural food flavors is limited only by your creativity... Allspice-Meat, fish, eggs, fruit, peas, red and yellow vegetables Almond Extract-Fruit baked goods Anise Seed-Sweet breads, fruit, carrots, beets, cottage cheese, cookies (tastes like licorice) Basil-Meat, fish, eggs, vegetables, rice, vegetables salads, soups, sauces Bay Leaf-Meat, fish, stews, poultry Burnet-Salad, vegetables (cucumber-like flavor) Caraway Seed-Bread, cookies, cottage cheese, meat, vegetables, cheese, rice Cardamon-Baked goods, fruit, soups Celery Powder or seed-Salads, salad dressings, sauces, meatloaf, soup, bread.Do not use  celery salt Chervil-Meats, salads, fish, eggs, vegetables, cottage cheese (parsley-like flavor) Chili Power-Meatloaf, chicken cheese, corn, eggplant, egg dishes Chives-Salads cottage cheese, egg dishes, soups, vegetables, sauces Cilantro-Salsa, casseroles Cinnamon-Baked goods, fruit, pork, lamb, chicken, carrots Cloves-Fruit, baked goods, fish, pot roast, green beans, beets, carrots Coriander-Pastry, cookies, meat, salads, cheese (lemon-orange flavor) Cumin-Meatloaf, fish,cheese, eggs, cabbage,fruit pie (caraway flavor) [employer], fruit, eggs, fish, poultry, cottage cheese,  vegetables Dill Seed-Meat, cottage cheese, poultry, vegetables, fish, salads, bread Fennel Seed-Bread, cookies, apples, pork, eggs, fish, beets, cabbage, cheese, Licorice-like flavor Garlic-(buds or powder) Salads, meat, poultry, fish, bread, butter, vegetables, potatoes.Do not  use garlic salt Ginger-Fruit, vegetables, baked goods, meat, fish, poultry Horseradish Root-Meet, vegetables, butter Lemon Juice or Extract-Vegetables, fruit, tea, baked goods, fish salads Mace-Baked goods fruit, vegetables, fish, poultry (taste like nutmeg) Maple Extract-Syrups Marjoram-Meat, chicken, fish, vegetables, breads, green salads (taste like Sage) Mint-Tea, lamb, sherbet, vegetables, desserts, carrots, cabbage Mustard, Dry or Seed-Cheese, eggs, meats, vegetables, poultry Nutmeg-Baked goods, fruit, chicken, eggs, vegetables, desserts Onion Powder-Meat, fish, poultry, vegetables, cheese, eggs, bread, rice salads (Do not use   Onion salt) Orange Extract-Desserts, baked goods Oregano-Pasta, eggs, cheese, onions, pork, lamb, fish, chicken, vegetables, green salads Paprika-Meat, fish, poultry, eggs, cheese, vegetables Parsley Flakes-Butter, vegetables, meat fish, poultry, eggs, bread, salads (certain forms may   Contain sodium Pepper-Meat fish, poultry, vegetables, eggs Peppermint Extract-Desserts, baked goods Poppy Seed-Eggs, bread, cheese, fruit dressings, baked goods, noodles, vegetables, cottage  [employer], poultry, meat, fish, cauliflower, turnips,eggs bread Saffron-Rice, bread,  veal, chicken, fish, eggs Sage-Meat, fish, poultry, onions, eggplant, tomateos, pork, stews Savory-Eggs, salads, poultry, meat, rice, vegetables, soups, pork Tarragon-Meat, poultry, fish, eggs, butter, vegetables (licorice-like flavor)  Thyme-Meat, poultry, fish, eggs, vegetables, (clover-like flavor), sauces, soups Tumeric-Salads, butter, eggs, fish, rice, vegetables (saffron-like  flavor) Vanilla Extract-Baked goods, candy Vinegar-Salads, vegetables, meat marinades Walnut Extract-baked goods, candy  2. Choose your Foods Wisely   The following is a list of foods to avoid which are high in sodium:  Meats-Avoid all smoked, canned, salt cured, dried and kosher meat and fish as well as Anchovies   [employer] Rx meats:Bologna, Liverwurst, Pastrami Canned meat or fish  Marinated herring Caviar    Pepperoni Corned Beef   Pizza Dried chipped beef  Salami Frozen breaded fish or meat Salt pork Frankfurters or hot dogs  Sardines Gefilte fish   Sausage Ham (boiled ham, Proscuitto Smoked butt    spiced ham)   Spam      TV Dinners Vegetables Canned vegetables (Regular) Relish Canned mushrooms  Sauerkraut Olives    Tomato juice Pickles  Bakery and Dessert Products Canned puddings  Cream pies Cheesecake   Decorated cakes Cookies  Beverages/Juices Tomato juice, regular  Gatorade   V-8 vegetable juice, regular  Breads and Cereals Biscuit mixes   Salted potato chips, corn chips, pretzels Bread stuffing mixes  Salted crackers and rolls Pancake and waffle mixes Self-rising flour  Seasonings Accent    Meat sauces Barbecue sauce  Meat tenderizer Catsup    Monosodium glutamate (MSG) Celery salt   Onion salt Chili sauce   Prepared mustard Garlic salt   Salt, seasoned salt, sea salt Gravy mixes   Soy sauce Horseradish   Steak sauce Ketchup   Tartar sauce Lite salt    Teriyaki sauce Marinade mixes   Worcestershire sauce  Others Baking powder   Cocoa and cocoa mixes Baking soda   Commercial casserole mixes Candy-caramels, chocolate  Dehydrated soups    Bars, fudge,nougats  Instant rice and pasta mixes Canned broth or soup  Maraschino cherries Cheese, aged and processed cheese and cheese spreads      Signed, Erma Heritage, PA-C  03/28/2021 7:25 PM    [facility] [address] [place], [place] [address] Phone: [phone] Fax: [phone]

## 2021-03-28 NOTE — Patient Instructions (Addendum)
Medication Instructions:  Your physician recommends that you continue on your current medications as directed. Please refer to the Current Medication list given to you today.  Call if headache continues   *If you need a refill on your cardiac medications before your next appointment, please call your pharmacy*   Lab Work: Your physician recommends that you return for lab work in: Today   If you have labs (blood work) drawn today and your tests are completely normal, you will receive your results only by: Marland Kitchen MyChart Message (if you have MyChart) OR . A paper copy in the mail If you have any lab test that is abnormal or we need to change your treatment, we will call you to review the results.   Testing/Procedures: NONE   Follow-Up: At [facility], you and your health needs are our priority.  As part of our continuing mission to provide you with exceptional heart care, we have created designated Provider Care Teams.  These Care Teams include your primary Cardiologist (physician) and Advanced Practice Providers (APPs -  Physician Assistants and Nurse Practitioners) who all work together to provide you with the care you need, when you need it.  We recommend signing up for the patient portal called "MyChart".  Sign up information is provided on this After Visit Summary.  MyChart is used to connect with patients for Virtual Visits (Telemedicine).  Patients are able to view lab/test results, encounter notes, upcoming appointments, etc.  Non-urgent messages can be sent to your provider as well.   To learn more about what you can do with MyChart, go to [email or web address].    Your next appointment:    As Planned  The format for your next appointment:   In Person  Provider:   Carlyle Dolly, MD   Other Instructions Thank you for choosing [facility]!     Two Gram Sodium Diet 2000 mg  What is Sodium? Sodium is a mineral found naturally in many foods. The most  significant source of sodium in the diet is table salt, which is about 40% sodium.  Processed, convenience, and preserved foods also contain a large amount of sodium.  The body needs only 500 mg of sodium daily to function,  A normal diet provides more than enough sodium even if you do not use salt.  Why Limit Sodium? A build up of sodium in the body can cause thirst, increased blood pressure, shortness of breath, and water retention.  Decreasing sodium in the diet can reduce edema and risk of heart attack or stroke associated with high blood pressure.  Keep in mind that there are many other factors involved in these health problems.  Heredity, obesity, lack of exercise, cigarette smoking, stress and what you eat all play a role.  General Guidelines:  Do not add salt at the table or in cooking.  One teaspoon of salt contains over 2 grams of sodium.  Read food labels  Avoid processed and convenience foods  Ask your dietitian before eating any foods not dicussed in the menu planning guidelines  Consult your physician if you wish to use a salt substitute or a sodium containing medication such as antacids.  Limit milk and milk products to 16 oz (2 cups) per day.  Shopping Hints:  READ LABELS!! "Dietetic" does not necessarily mean low sodium.  Salt and other sodium ingredients are often added to foods during processing.   Menu Planning Guidelines Food Group Choose More Often Avoid  Beverages (see also the  milk group All fruit juices, low-sodium, salt-free vegetables juices, low-sodium carbonated beverages Regular vegetable or tomato juices, commercially softened water used for drinking or cooking  Breads and Cereals Enriched white, wheat, rye and pumpernickel bread, hard rolls and dinner rolls; muffins, cornbread and waffles; most dry cereals, cooked cereal without added salt; unsalted crackers and breadsticks; low sodium or homemade bread crumbs Bread, rolls and crackers with salted tops; quick  breads; instant hot cereals; pancakes; commercial bread stuffing; self-rising flower and biscuit mixes; regular bread crumbs or cracker crumbs  Desserts and Sweets Desserts and sweets mad with mild should be within allowance Instant pudding mixes and cake mixes  Fats Butter or margarine; vegetable oils; unsalted salad dressings, regular salad dressings limited to 1 Tbs; light, sour and heavy cream Regular salad dressings containing bacon fat, bacon bits, and salt pork; snack dips made with instant soup mixes or processed cheese; salted nuts  Fruits Most fresh, frozen and canned fruits Fruits processed with salt or sodium-containing ingredient (some dried fruits are processed with sodium sulfites        Vegetables Fresh, frozen vegetables and low- sodium canned vegetables Regular canned vegetables, sauerkraut, pickled vegetables, and others prepared in brine; frozen vegetables in sauces; vegetables seasoned with ham, bacon or salt pork  Condiments, Sauces, Miscellaneous  Salt substitute with physician's approval; pepper, herbs, spices; vinegar, lemon or lime juice; hot pepper sauce; garlic powder, onion powder, low sodium soy sauce (1 Tbs.); low sodium condiments (ketchup, chili sauce, mustard) in limited amounts (1 tsp.) fresh ground horseradish; unsalted tortilla chips, pretzels, potato chips, popcorn, salsa (1/4 cup) Any seasoning made with salt including garlic salt, celery salt, onion salt, and seasoned salt; sea salt, rock salt, kosher salt; meat tenderizers; monosodium glutamate; mustard, regular soy sauce, barbecue, sauce, chili sauce, teriyaki sauce, steak sauce, Worcestershire sauce, and most flavored vinegars; canned gravy and mixes; regular condiments; salted snack foods, olives, picles, relish, horseradish sauce, catsup   Food preparation: Try these seasonings Meats:    Pork Sage, onion Serve with applesauce  Chicken Poultry seasoning, thyme, parsley Serve with cranberry sauce  Lamb  Curry powder, rosemary, garlic, thyme Serve with mint sauce or jelly  Veal Marjoram, basil Serve with current jelly, cranberry sauce  Beef Pepper, bay leaf Serve with dry mustard, unsalted chive butter  Fish Bay leaf, dill Serve with unsalted lemon butter, unsalted parsley butter  Vegetables:    Asparagus Lemon juice   Broccoli Lemon juice   Carrots Mustard dressing parsley, mint, nutmeg, glazed with unsalted butter and sugar   Green beans Marjoram, lemon juice, nutmeg,dill seed   Tomatoes Basil, marjoram, onion   Spice /blend for [employer]" 4 tsp ground thyme 1 tsp ground sage 3 tsp ground rosemary 4 tsp ground marjoram   Test your knowledge 1. A product that says "Salt Free" may still contain sodium. True or False 2. Garlic Powder and Hot Pepper Sauce an be used as alternative seasonings.True or False 3. Processed foods have more sodium than fresh foods.  True or False 4. Canned Vegetables have less sodium than froze True or False  WAYS TO DECREASE YOUR SODIUM INTAKE 1. Avoid the use of added salt in cooking and at the table.  Table salt (and other prepared seasonings which contain salt) is probably one of the greatest sources of sodium in the diet.  Unsalted foods can gain flavor from the sweet, sour, and butter taste sensations of herbs and spices.  Instead of using salt for seasoning, try the  following seasonings with the foods listed.  Remember: how you use them to enhance natural food flavors is limited only by your creativity... Allspice-Meat, fish, eggs, fruit, peas, red and yellow vegetables Almond Extract-Fruit baked goods Anise Seed-Sweet breads, fruit, carrots, beets, cottage cheese, cookies (tastes like licorice) Basil-Meat, fish, eggs, vegetables, rice, vegetables salads, soups, sauces Bay Leaf-Meat, fish, stews, poultry Burnet-Salad, vegetables (cucumber-like flavor) Caraway Seed-Bread, cookies, cottage cheese, meat, vegetables, cheese, rice Cardamon-Baked goods,  fruit, soups Celery Powder or seed-Salads, salad dressings, sauces, meatloaf, soup, bread.Do not use  celery salt Chervil-Meats, salads, fish, eggs, vegetables, cottage cheese (parsley-like flavor) Chili Power-Meatloaf, chicken cheese, corn, eggplant, egg dishes Chives-Salads cottage cheese, egg dishes, soups, vegetables, sauces Cilantro-Salsa, casseroles Cinnamon-Baked goods, fruit, pork, lamb, chicken, carrots Cloves-Fruit, baked goods, fish, pot roast, green beans, beets, carrots Coriander-Pastry, cookies, meat, salads, cheese (lemon-orange flavor) Cumin-Meatloaf, fish,cheese, eggs, cabbage,fruit pie (caraway flavor) [employer], fruit, eggs, fish, poultry, cottage cheese, vegetables Dill Seed-Meat, cottage cheese, poultry, vegetables, fish, salads, bread Fennel Seed-Bread, cookies, apples, pork, eggs, fish, beets, cabbage, cheese, Licorice-like flavor Garlic-(buds or powder) Salads, meat, poultry, fish, bread, butter, vegetables, potatoes.Do not  use garlic salt Ginger-Fruit, vegetables, baked goods, meat, fish, poultry Horseradish Root-Meet, vegetables, butter Lemon Juice or Extract-Vegetables, fruit, tea, baked goods, fish salads Mace-Baked goods fruit, vegetables, fish, poultry (taste like nutmeg) Maple Extract-Syrups Marjoram-Meat, chicken, fish, vegetables, breads, green salads (taste like Sage) Mint-Tea, lamb, sherbet, vegetables, desserts, carrots, cabbage Mustard, Dry or Seed-Cheese, eggs, meats, vegetables, poultry Nutmeg-Baked goods, fruit, chicken, eggs, vegetables, desserts Onion Powder-Meat, fish, poultry, vegetables, cheese, eggs, bread, rice salads (Do not use   Onion salt) Orange Extract-Desserts, baked goods Oregano-Pasta, eggs, cheese, onions, pork, lamb, fish, chicken, vegetables, green salads Paprika-Meat, fish, poultry, eggs, cheese, vegetables Parsley Flakes-Butter, vegetables, meat fish, poultry, eggs, bread, salads (certain forms may   Contain  sodium Pepper-Meat fish, poultry, vegetables, eggs Peppermint Extract-Desserts, baked goods Poppy Seed-Eggs, bread, cheese, fruit dressings, baked goods, noodles, vegetables, cottage  [employer], poultry, meat, fish, cauliflower, turnips,eggs bread Saffron-Rice, bread, veal, chicken, fish, eggs Sage-Meat, fish, poultry, onions, eggplant, tomateos, pork, stews Savory-Eggs, salads, poultry, meat, rice, vegetables, soups, pork Tarragon-Meat, poultry, fish, eggs, butter, vegetables (licorice-like flavor)  Thyme-Meat, poultry, fish, eggs, vegetables, (clover-like flavor), sauces, soups Tumeric-Salads, butter, eggs, fish, rice, vegetables (saffron-like flavor) Vanilla Extract-Baked goods, candy Vinegar-Salads, vegetables, meat marinades Walnut Extract-baked goods, candy  2. Choose your Foods Wisely   The following is a list of foods to avoid which are high in sodium:  Meats-Avoid all smoked, canned, salt cured, dried and kosher meat and fish as well as Anchovies   [employer] Rx meats:Bologna, Liverwurst, Pastrami Canned meat or fish  Marinated herring Caviar    Pepperoni Corned Beef   Pizza Dried chipped beef  Salami Frozen breaded fish or meat Salt pork Frankfurters or hot dogs  Sardines Gefilte fish   Sausage Ham (boiled ham, Proscuitto Smoked butt    spiced ham)   Spam      TV Dinners Vegetables Canned vegetables (Regular) Relish Canned mushrooms  Sauerkraut Olives    Tomato juice Pickles  Bakery and Dessert Products Canned puddings  Cream pies Cheesecake   Decorated cakes Cookies  Beverages/Juices Tomato juice, regular  Gatorade   V-8 vegetable juice, regular  Breads and Cereals Biscuit mixes   Salted potato chips, corn chips, pretzels Bread stuffing mixes  Salted crackers and rolls Pancake and waffle mixes Self-rising flour  Seasonings Accent    Meat  sauces Barbecue sauce  Meat tenderizer Catsup    Monosodium  glutamate (MSG) Celery salt   Onion salt Chili sauce   Prepared mustard Garlic salt   Salt, seasoned salt, sea salt Gravy mixes   Soy sauce Horseradish   Steak sauce Ketchup   Tartar sauce Lite salt    Teriyaki sauce Marinade mixes   Worcestershire sauce  Others Baking powder   Cocoa and cocoa mixes Baking soda   Commercial casserole mixes Candy-caramels, chocolate  Dehydrated soups    Bars, fudge,nougats  Instant rice and pasta mixes Canned broth or soup  Maraschino cherries Cheese, aged and processed cheese and cheese spreads

## 2021-04-05 ENCOUNTER — Telehealth: Payer: Self-pay | Admitting: Pharmacist

## 2021-04-05 ENCOUNTER — Telehealth: Payer: Self-pay

## 2021-04-05 ENCOUNTER — Ambulatory Visit: Payer: Medicaid Other

## 2021-04-05 NOTE — Telephone Encounter (Signed)
CALLED AND LMOMED PT TO R/S MISSED APPT

## 2021-04-05 NOTE — Progress Notes (Deleted)
Patient ID: TARUS ENTIN                 DOB: 1971-04-27                    MRN: [id number]     HPI: Wayne Spencer is a 50 y.o. male patient of Dr Harl Bowie referred to lipid clinic by Robbie Lis, PA. 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 Admitted 03/13/21 with STEMI. Cardiac cath showed RPAV lesion is 60% stenosed with 100% stenosed (culprit lesion) side branch in 2nd RPL, balloon angioplasty was performed with 30% residual stenosis leading into PL3, side branch remained 100% stenosed. Echo 03/13/21 showed LVEF 55-60%.  Recheck lipid panel Needs to stop smoking and lose weight Previously on atorva 80 and simva '40mg'$  daily - any issues? Needs pcsk9i or at least nexlizet repatha PA sent - medicaid. Approved thru 04/05/22, can send for 3 mo supply Adherent to rosuva and ezetimibe?  Current Medications: rosuvastatin '40mg'$  daily, ezetimibe '10mg'$  daily Intolerances:  Risk Factors: premature CAD s/p STEMI, HTN, pre-DM, obesity, tobacco abuse, fam hx LDL goal: '55mg'$ /dL  Diet:   Exercise:   Family History: CAD in his brother and mother; Diabetes in his brother; Heart attack in his brother, father, and sister; Kidney failure in his sister.   Social History: 1/2 PPD since 7. Occasional alcohol use, no drug use.  Labs: 03/13/21: TC 138, TG 219, HDL 29, LDL 65; LDL-D 85 (rosuvastatin '40mg'$  daily -LDL likely falsely low since checked at time of MI)  Past Medical History:  Diagnosis Date  . CAD (coronary artery disease)    a. s/p DES to RCA in 2014 b. 03/2021: s/p STEMI: widely patent distal RCA stent and 100% occlusion of RPL 2 and balloon angioplasty was performed and there was 30% residual stenosis leading into the PL3 and sidebranch remained 100% stenosed. Nonobstructive disease along the LAD and LCx  . Cellulitis   . Chronic headache   . Hyperlipidemia   . Hyperlipidemia  associated with type 2 diabetes mellitus ([place])   . Hypertension   . Nephrotic syndrome   . Renal insufficiency   . Tobacco abuse   . Umbilical hernia     Current Outpatient Medications on File Prior to Visit  Medication Sig Dispense Refill  . acetaminophen (TYLENOL) 500 MG tablet Take 1,000 mg by mouth every 6 (six) hours as needed for headache.    . albuterol (PROVENTIL HFA;VENTOLIN HFA) 108 (90 Base) MCG/ACT inhaler Inhale 1-2 puffs into the lungs every 6 (six) hours as needed for wheezing or shortness of breath.    . ALPRAZolam (XANAX) 1 MG tablet Take 1 mg by mouth 3 (three) times daily as needed for anxiety.    Marland Kitchen amLODipine (NORVASC) 10 MG tablet Take 1 tablet (10 mg total) by mouth daily. 180 tablet 3  . aspirin EC 81 MG EC tablet Take 1 tablet (81 mg total) by mouth daily.    . cyclobenzaprine (FLEXERIL) 10 MG tablet Take 10 mg by mouth 3 (three) times daily as needed for muscle spasms.    Marland Kitchen ezetimibe (ZETIA) 10 MG tablet Take 1 tablet (10 mg total) by mouth daily. 90 tablet 3  . furosemide (LASIX) 40 MG tablet TAKE 40 MG DAILY. MAY TAKE AN ADDITIONAL 20 MG AS NEEDED FOR SEVERE LEG SWELLING. (Patient taking differently: Take 40  mg by mouth daily. May take additional '20mg'$  as needed for leg swelling) 90 tablet 3  . hydrALAZINE (APRESOLINE) 50 MG tablet Take 1 tablet (50 mg total) by mouth 3 (three) times daily. 90 tablet 3  . HYDROcodone-acetaminophen (NORCO) 7.5-325 MG per tablet Take 1 tablet by mouth every 4 (four) hours as needed for pain.    . isosorbide mononitrate (IMDUR) 30 MG 24 hr tablet Take 30 mg by mouth daily.    Marland Kitchen losartan (COZAAR) 50 MG tablet Take 1 tablet (50 mg total) by mouth daily. 90 tablet 3  . metoprolol succinate (TOPROL-XL) 100 MG 24 hr tablet TAKE (1) TABLET BY MOUTH IN THE MORNING TAKE (1) TABLET AT BEDTIME TAKE WITH OR IMMEDIATELY FOLLOWING A MEAL. (Patient taking differently: Take 100 mg by mouth in the morning and at bedtime.) 180 tablet 2  . nitroGLYCERIN  (NITROSTAT) 0.4 MG SL tablet Place 1 tablet (0.4 mg total) under the tongue every 5 (five) minutes as needed for chest pain. 25 tablet 3  . pantoprazole (PROTONIX) 40 MG tablet Take 40 mg by mouth daily.    . potassium chloride SA (KLOR-CON) 20 MEQ tablet TAKE (1) TABLET BY MOUTH TWICE DAILY. (Patient taking differently: Take 20 mEq by mouth 2 (two) times daily.) 180 tablet 3  . rosuvastatin (CRESTOR) 40 MG tablet Take 40 mg by mouth daily.    . ticagrelor (BRILINTA) 90 MG TABS tablet Take 1 tablet (90 mg total) by mouth 2 (two) times daily. 60 tablet 11   No current facility-administered medications on file prior to visit.    Allergies  Allergen Reactions  . Mobic [Meloxicam]     Unknown   . Penicillins Hives  . Tramadol     REACTION: itching, breaking out.    Assessment/Plan:  1. Hyperlipidemia -

## 2021-04-05 NOTE — Telephone Encounter (Signed)
Pt missed lipid appt today. Message has been left to r/s. Of note, Repatha PA has already been submitted and approved through 04/05/22.

## 2021-04-15 ENCOUNTER — Telehealth: Payer: Self-pay | Admitting: Cardiology

## 2021-04-15 NOTE — Telephone Encounter (Signed)
You can refill Brilinta, Plavix Thanks

## 2021-04-15 NOTE — Telephone Encounter (Signed)
Pt is needing refill for Rx #: [id number]  ticagrelor (BRILINTA) 90 MG TABS tablet [id number]  Sent to [facility]

## 2021-04-16 ENCOUNTER — Telehealth: Payer: Self-pay | Admitting: Cardiology

## 2021-04-16 MED ORDER — TICAGRELOR 90 MG PO TABS: 90.0000 mg | ORAL_TABLET | Freq: Two times a day (BID) | ORAL | 11 refills | Status: DC

## 2021-04-16 NOTE — Telephone Encounter (Signed)
New message  Patient wife said prescription was not called in - he is on his last pill   *STAT* If patient is at the pharmacy, call can be transferred to refill team.   1. Which medications need to be refilled? (please list name of each medication and dose if known) Brilinta   2. Which pharmacy/location (including street and city if local pharmacy) is medication to be sent to? [facility]  3. Do they need a 30 day or 90 day supply? [place]

## 2021-04-16 NOTE — Telephone Encounter (Signed)
Refilled to [facility], brilinta 90 mg BID #[id number]

## 2021-05-21 ENCOUNTER — Encounter: Payer: Self-pay | Admitting: Cardiology

## 2021-05-21 ENCOUNTER — Other Ambulatory Visit: Payer: Self-pay

## 2021-05-21 ENCOUNTER — Ambulatory Visit: Payer: Medicaid Other | Admitting: Cardiology

## 2021-05-21 VITALS — BP 108/61 | HR 88 | Ht 75.5 in | Wt 334.0 lb

## 2021-05-21 DIAGNOSIS — E782 Mixed hyperlipidemia: Secondary | ICD-10-CM | POA: Diagnosis not present

## 2021-05-21 DIAGNOSIS — I10 Essential (primary) hypertension: Secondary | ICD-10-CM

## 2021-05-21 DIAGNOSIS — I2510 Atherosclerotic heart disease of native coronary artery without angina pectoris: Secondary | ICD-10-CM | POA: Diagnosis not present

## 2021-05-21 DIAGNOSIS — R600 Localized edema: Secondary | ICD-10-CM | POA: Diagnosis not present

## 2021-05-21 MED ORDER — FUROSEMIDE 40 MG PO TABS: 60.0000 mg | ORAL_TABLET | Freq: Every day | ORAL | 1 refills | Status: DC

## 2021-05-21 NOTE — Patient Instructions (Addendum)
Medication Instructions:  Your physician has recommended you make the following change in your medication:  Increase furosemide to 60 mg by mouth daily Stop isosorbide mononitrate Continue other medications the same  Labwork: none  Testing/Procedures: none  Follow-Up: Your physician recommends that you schedule a follow-up appointment in: 4 months  Any Other Special Instructions Will Be Listed Below (If Applicable).  If you need a refill on your cardiac medications before your next appointment, please call your pharmacy.

## 2021-05-21 NOTE — Progress Notes (Signed)
Clinical Summary Wayne Spencer is a 50 y.o.maleseen today for follow up for the following medical problems.    1. CAD - prior NSTEMI 08/2013, DES to RCA. - 08/2013 Echo: LVEF 45-50%, hypokinesis inferior wall - not on ACE due to renal function    - can have some SOB, improved with albuterol. Worst with smoking. No significant chest pain.  - compliant with meds  03/2021 presented with chest pain, code STEMI - cath with occluded RPL 2, received PTCA - echo LVEF 55-60%  - compliant with meds - ongoing headaches, he thinks are related to [place].  - mild nonspecific chest pains non exertional    2. CKD  III - followed by renal - has appt next week     3. HTN - compliant with meds   4. Hyperlipidemia - compliant, LDL was at goal during 03/2021 admission  5. LE edema - takes lasix '40mg'$  daily, has noticed some increased swelling.    Past Medical History:  Diagnosis Date   CAD (coronary artery disease)    a. s/p DES to RCA in 2014 b. 03/2021: s/p STEMI: widely patent distal RCA stent and 100% occlusion of RPL 2 and balloon angioplasty was performed and there was 30% residual stenosis leading into the PL3 and sidebranch remained 100% stenosed. Nonobstructive disease along the LAD and LCx   Cellulitis    Chronic headache    Hyperlipidemia    Hyperlipidemia associated with type 2 diabetes mellitus (HCC)    Hypertension    Nephrotic syndrome    Renal insufficiency    Tobacco abuse    Umbilical hernia      Allergies  Allergen Reactions   Mobic [Meloxicam]     Unknown    Penicillins Hives   Tramadol     REACTION: itching, breaking out.     Current Outpatient Medications  Medication Sig Dispense Refill   acetaminophen (TYLENOL) 500 MG tablet Take 1,000 mg by mouth every 6 (six) hours as needed for headache.     albuterol (PROVENTIL HFA;VENTOLIN HFA) 108 (90 Base) MCG/ACT inhaler Inhale 1-2 puffs into the lungs every 6 (six) hours as needed for wheezing or shortness  of breath.     ALPRAZolam (XANAX) 1 MG tablet Take 1 mg by mouth 3 (three) times daily as needed for anxiety.     amLODipine (NORVASC) 10 MG tablet Take 1 tablet (10 mg total) by mouth daily. 180 tablet 3   aspirin EC 81 MG EC tablet Take 1 tablet (81 mg total) by mouth daily.     cyclobenzaprine (FLEXERIL) 10 MG tablet Take 10 mg by mouth 3 (three) times daily as needed for muscle spasms.     ezetimibe (ZETIA) 10 MG tablet Take 1 tablet (10 mg total) by mouth daily. 90 tablet 3   furosemide (LASIX) 40 MG tablet TAKE 40 MG DAILY. MAY TAKE AN ADDITIONAL 20 MG AS NEEDED FOR SEVERE LEG SWELLING. (Patient taking differently: Take 40 mg by mouth daily. May take additional '20mg'$  as needed for leg swelling) 90 tablet 3   hydrALAZINE (APRESOLINE) 50 MG tablet Take 1 tablet (50 mg total) by mouth 3 (three) times daily. 90 tablet 3   HYDROcodone-acetaminophen (NORCO) 7.5-325 MG per tablet Take 1 tablet by mouth every 4 (four) hours as needed for pain.     isosorbide mononitrate (IMDUR) 30 MG 24 hr tablet Take 30 mg by mouth daily.     losartan (COZAAR) 50 MG tablet Take 1 tablet (50  mg total) by mouth daily. 90 tablet 3   metoprolol succinate (TOPROL-XL) 100 MG 24 hr tablet TAKE (1) TABLET BY MOUTH IN THE MORNING TAKE (1) TABLET AT BEDTIME TAKE WITH OR IMMEDIATELY FOLLOWING A MEAL. (Patient taking differently: Take 100 mg by mouth in the morning and at bedtime.) 180 tablet 2   nitroGLYCERIN (NITROSTAT) 0.4 MG SL tablet Place 1 tablet (0.4 mg total) under the tongue every 5 (five) minutes as needed for chest pain. 25 tablet 3   pantoprazole (PROTONIX) 40 MG tablet Take 40 mg by mouth daily.     potassium chloride SA (KLOR-CON) 20 MEQ tablet TAKE (1) TABLET BY MOUTH TWICE DAILY. (Patient taking differently: Take 20 mEq by mouth 2 (two) times daily.) 180 tablet 3   rosuvastatin (CRESTOR) 40 MG tablet Take 40 mg by mouth daily.     ticagrelor (BRILINTA) 90 MG TABS tablet Take 1 tablet (90 mg total) by mouth 2 (two)  times daily. 60 tablet 11   No current facility-administered medications for this visit.     Past Surgical History:  Procedure Laterality Date   CORONARY STENT PLACEMENT     CORONARY/GRAFT ACUTE MI REVASCULARIZATION N/A 03/13/2021   Procedure: Coronary/Graft Acute MI Revascularization;  Surgeon: Leonie Man, MD;  Location: [facility];  Service: Cardiovascular;  Laterality: N/A;   LEFT HEART CATH AND CORONARY ANGIOGRAPHY N/A 03/13/2021   Procedure: LEFT HEART CATH AND CORONARY ANGIOGRAPHY;  Surgeon: Leonie Man, MD;  Location: [facility];  Service: Cardiovascular;  Laterality: N/A;   LEFT HEART CATHETERIZATION WITH CORONARY ANGIOGRAM N/A 08/10/2013   Procedure: LEFT HEART CATHETERIZATION WITH CORONARY ANGIOGRAM;  Surgeon: [name], MD;  Location[facility] C[facility];  Service: Cardiovascular;  Laterality: N/A;   PERCUTANEOUS CORONARY STENT INTERVENTION (PCI-S) N/A 08/11/2013   Procedure: PERCUTANEOUS CORONARY STENT INTERVENTION (PCI-S);  Surgeon: Blane Ohara, MD;  Location: [facility] CATH LAB;  Service: Cardiovascular;  Laterality: N/A;     Allergies  Allergen Reactions   Mobic [Meloxicam]     Unknown    Penicillins Hives   Tramadol     REACTION: itching, breaking out.      Family History  Problem Relation Age of Onset   Heart attack Father        81s   Heart attack Brother        52s, s/p CABG, passed from MI at 93   Heart attack Sister        45s   CAD Mother    Kidney failure Sister    Diabetes Brother    CAD Brother      Social History Wayne Spencer reports that he has been smoking cigarettes. He started smoking about 36 years ago. He has a 3.00 pack-year smoking history. He has never used smokeless tobacco. Wayne Spencer reports previous alcohol use.   Review of Systems CONSTITUTIONAL: No weight loss, fever, chills, weakness or fatigue.  HEENT: Eyes: No visual loss, blurred vision, double vision or yellow sclerae.No hearing loss, sneezing,  congestion, runny nose or sore throat.  SKIN: No rash or itching.  CARDIOVASCULAR: per hpi RESPIRATORY: No shortness of breath, cough or sputum.  GASTROINTESTINAL: No anorexia, nausea, vomiting or diarrhea. No abdominal pain or blood.  GENITOURINARY: No burning on urination, no polyuria NEUROLOGICAL: No headache, dizziness, syncope, paralysis, ataxia, numbness or tingling in the extremities. No change in bowel or bladder control.  MUSCULOSKELETAL: No muscle, back pain, joint pain or stiffness.  LYMPHATICS: No enlarged  nodes. No history of splenectomy.  PSYCHIATRIC: No history of depression or anxiety.  ENDOCRINOLOGIC: No reports of sweating, cold or heat intolerance. No polyuria or polydipsia.  Marland Kitchen   Physical Examination Today's Vitals   05/21/21 1356  BP: 108/61  Pulse: 88  SpO2: 97%  Weight: (!) 334 lb (151.5 kg)  Height: 6' 3.5" (1.918 m)   Body mass index is 41.2 kg/m.  Gen: resting comfortably, no acute distress HEENT: no scleral icterus, pupils equal round and reactive, no palptable cervical adenopathy,  CV: RRR, no m/rg, no jvd Resp: Clear to auscultation bilaterally GI: abdomen is soft, non-tender, non-distended, normal bowel sounds, no hepatosplenomegaly MSK: extremities are warm, 1+ bilateral LE edema Skin: warm, no rash Neuro:  no focal deficits Psych: appropriate affect   Diagnostic Studies 08/11/13 Cath  Hemodynamics: LV pressure: 156/25 Aortic pressure: 153/96 Angiography Left Main: large normal Left anterior Descending: large, mild - moderate irregularlites. The 1st diag is a small - moderate sized vessel with a 90% stenosis in the proximal segment Left Circumflex: moderate - large vessel. Minor luminal irreg. Right Coronary Artery: large and dominant. Occluded before the bifurcation . The distal RCA can be seen filling from collaterals from the left system LV Gram: not performed. The AV was crossed for pressures. Complications: No apparent  complications Patient did tolerate procedure well. Contrast used: 36 cc contrast Conclusions:  1. CAD : his RCA is occluded and fills via collaterals. His 1st diag has a tight stenosis but is probably too small to attempt PCI given his tenuous renal status. Lesion Data: Vessel: RCA/distal Percent stenosis (pre): 99 TIMI-flow (pre): 1 Stent: 4.0x38 mm DES Percent stenosis (post): 0 TIMI-flow (post): 3 Conclusions: Successful PCI of the distal RCA Recommendations: ASA/plavix at least 12 months. Integrelin 12 hours.   08/2013 Echo  LVEF 45-50%, inferior hypokinesis, mild LAE   Clinic EKG 01/09/14: [place]    03/2021 cath LV end diastolic pressure is mildly elevated. There is no aortic valve stenosis. ----------------------------------------------------------- RPAV lesion is 60% stenosed with 100% stenosed (culprit lesion) side Taia Bramlett in 2nd RPL. Balloon angioplasty was performed on the follow-up on the vessel into PL 3 to restore flow) using a BALLOON SAPPHIRE 2.0X12. Post intervention, there is a 30% residual stenosis leading into PL 3. Post intervention, the side Gabriella Guile remained 100% stenosed Mid RCA to Dist RCA stent is 5% stenosed. Ost RCA lesion is 30% stenosed. ------------------------------------------------------------ Prox LAD lesion is 45% stenosed with 30% stenosed side Rocket Gunderson in 1st Diag. Mid LAD lesion is 30% stenosed with 50% stenosed side Marisha Renier in 2nd Diag. Ost Cx to Prox Cx lesion is 25% stenosed.   SUMMARY Single-vessel disease with widely patent stent in the distal RCA, culprit lesion is 100% flush occlusion of RPL 2 (unable to engage with wire), 80% stenosis of follow-on RPA V into RPL 3 (successful balloon PTCA only) => very difficult vessel to wire, very tortuous and extremely distal.  Poor visualization due to body habitus.  Therefore chose to do PTCA only and allow for completion of the infarct-related artery closure. Due to the patient's renal insufficiency  and very large patulous vessels requiring  large volumes of contrast to fill and requirement for frequent angiography, chose to stop at this point. Diffuse mild to moderate disease in the LAD with most prominent lesion being 50 % lesion in the ostial D2.       Post cath RECOMMENDATIONS: Overnight in CCU-we will do 3 hours IV Aggrastat to help distal flow. Check 2D  echo in the morning; cycle troponins [facility] consult for the morning. Has been loaded with Brilinta, continue aspirin and Brilinta for ACS x1 year.  (Would likely be able to hold after 6 months) Continue aggressive risk factor modification, has multiple medications already listed.  Will adjust as BP and labs tolerate. Expect if he remains comfortable in the next day or so he can probably discharge home after 48 hours.  03/2021 echo 1. Limited study due to poor acoustic windows. Left ventricular ejection  fraction, by estimation, is 55 to 60%. The left ventricle has normal  function. Wall motion difficult to assess due to poor definition of the  endocardial borders. Based on limited  views, there appears to be hypokinesis of the basal inferior and basal  inferolateral walls.The left ventricular internal cavity size was mildly  dilated. Left ventricular diastolic parameters were normal.   2. Right ventricular systolic function is normal. The right ventricular  size is normal.   3. The mitral valve is normal in structure. Trivial mitral valve  regurgitation. No evidence of mitral stenosis.   4. The aortic valve is tricuspid. Aortic valve regurgitation is not  visualized. No aortic stenosis is present.   5. Aortic dilatation noted. 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   6. The inferior vena cava is dilated in size with >50% respiratory  variability, suggesting right atrial pressure of 8 mmHg.    Assessment and Plan  1. CAD -no recent symptoms, continue DAPT at least until 03/2022 - headaches unclear if  related to brillinta, will first try stopping his imdur. If ongoign would change brillinta to plavix.         2. HTN - at goal, continue current meds   3. Hyperlipidemia - LDL has been at goal, continue statin  4. LE edema - recent increase, we will increase his lasix to '60mg'$  daily - he has f/u with renal next week with labs.    F/u 4 months   Arnoldo Lenis, M.D.

## 2021-06-04 ENCOUNTER — Telehealth: Payer: Self-pay | Admitting: Cardiology

## 2021-06-04 DIAGNOSIS — Z79899 Other long term (current) drug therapy: Secondary | ICD-10-CM

## 2021-06-04 NOTE — Telephone Encounter (Signed)
Returned call to wife. No answer. Left msg to call back.  

## 2021-06-04 NOTE — Telephone Encounter (Signed)
Spoke with wife who states that the pt has increased swelling. Pt denies SOB and Chest pain at this time. When asked to weigh pt, wife stated that they do not own a scale but would get one. Please advise.

## 2021-06-04 NOTE — Telephone Encounter (Signed)
Wayne Spencer's wife-Colette Toler called said dr Harl Bowie prescribed/increased medication -Lasix to '60mg'$ . He is still swollen really bad in his feet, hands, legs, arms and face are puffy. She is wondering if they need to change his medication or increase it more. Please call her back to let her know what they need to do.

## 2021-06-05 NOTE — Telephone Encounter (Signed)
When I saw him he was to see renal the following week and have blood work. Was the blood work done if so can we get the results? Depending on findings could consider further changes in his diuretic. Did his kidney doctor make any changes at there visit    Zandra Abts MD

## 2021-06-05 NOTE — Telephone Encounter (Signed)
Patient wife called back , has not heard back from Dr Harl Bowie and would like to know how to proceed. They have a call into patients Kidney doctor as well.

## 2021-06-06 ENCOUNTER — Encounter: Payer: Self-pay | Admitting: *Deleted

## 2021-06-06 NOTE — Telephone Encounter (Signed)
Needs a bmet/mg/bnp tomorrow for [place] to consider any additional diuretic changes   Zandra Abts MD

## 2021-06-06 NOTE — Telephone Encounter (Signed)
Spoke with pt's wife who states that she does not recall the last time pt was seen by renal. Wife was able to provide name and number of renal doctor. Request sent to have labs and office note sent. Renal states that last office visit was Feb. 21, 2022. Please advise.

## 2021-06-07 NOTE — Telephone Encounter (Signed)
Patient wife is returning call to [facility]

## 2021-06-07 NOTE — Telephone Encounter (Signed)
Called pt with plan of care. No answer. Left msg to return call.

## 2021-06-07 NOTE — Telephone Encounter (Signed)
Pt's wife notified of the need to have labs done. Wife agrees with plan of care and will have labs done on Monday.

## 2021-06-11 ENCOUNTER — Other Ambulatory Visit: Payer: Self-pay | Admitting: Physician Assistant

## 2021-06-12 NOTE — Telephone Encounter (Signed)
This is a [place] pt, Dr. Branch 

## 2021-07-02 ENCOUNTER — Telehealth: Payer: Self-pay | Admitting: Cardiology

## 2021-07-02 NOTE — Telephone Encounter (Signed)
New message    Patient wife called again - clarified with patient wife if patient was taking dialysis and she said no but he is seeing a doctor there, and they want him to increase the fluid pill because he is really swollen, but she wants a second opinion from Dr Harl Bowie because they made them see a APP yesterday

## 2021-07-02 NOTE — Telephone Encounter (Signed)
Pt c/o swelling: STAT is pt has developed SOB within 24 hours  If swelling, where is the swelling located swollen all over   How much weight have you gained and in what time span? 340 today at dialysis   Have you gained 3 pounds in a day or 5 pounds in a week? Not sure he weighed 330 last time at dialysis   Do you have a log of your daily weights (if so, list)? no  Are you currently taking a fluid pill? Yes and the dialysis told him to take an extra 40 but they dont want to do that without talking to dr branch  Are you currently SOB? no  Have you traveled recently? no

## 2021-07-02 NOTE — Telephone Encounter (Signed)
When I saw him June 21 he had not even seen renal, is he on dialysis now? If so when did this start exactly? If he is on dialysis and having swelling problems he would need to discuss with his nephrologist as fluid management would be with dialysis  Zandra Abts MD

## 2021-07-02 NOTE — Telephone Encounter (Signed)
I will forward to Dr.Branch for dispo

## 2021-07-02 NOTE — Telephone Encounter (Signed)
I spoke with wife and she clarified that Wayne Spencer is not on dialysis. He was seen by a PA with his nephrologist as a regular office visit and they were told to take an extra lasix 40 mg x 1 for weight gain. They will call us back tomorrow th his weight.

## 2021-09-06 ENCOUNTER — Other Ambulatory Visit: Payer: Self-pay | Admitting: Cardiology

## 2021-09-06 ENCOUNTER — Other Ambulatory Visit: Payer: Self-pay | Admitting: Student

## 2021-09-10 ENCOUNTER — Other Ambulatory Visit: Payer: Self-pay | Admitting: Cardiology

## 2021-09-19 ENCOUNTER — Other Ambulatory Visit: Payer: Self-pay | Admitting: Cardiology

## 2021-09-24 ENCOUNTER — Ambulatory Visit: Payer: Medicaid Other | Admitting: Cardiology

## 2021-09-24 ENCOUNTER — Encounter: Payer: Self-pay | Admitting: Cardiology

## 2021-09-24 ENCOUNTER — Other Ambulatory Visit: Payer: Self-pay

## 2021-09-24 VITALS — BP 144/68 | HR 69 | Ht 75.5 in | Wt 337.0 lb

## 2021-09-24 DIAGNOSIS — I2510 Atherosclerotic heart disease of native coronary artery without angina pectoris: Secondary | ICD-10-CM

## 2021-09-24 DIAGNOSIS — I10 Essential (primary) hypertension: Secondary | ICD-10-CM | POA: Diagnosis not present

## 2021-09-24 DIAGNOSIS — E782 Mixed hyperlipidemia: Secondary | ICD-10-CM | POA: Diagnosis not present

## 2021-09-24 DIAGNOSIS — G4730 Sleep apnea, unspecified: Secondary | ICD-10-CM | POA: Diagnosis not present

## 2021-09-24 NOTE — Patient Instructions (Signed)
Medication Instructions:  Your physician recommends that you continue on your current medications as directed. Please refer to the Current Medication list given to you today.  *If you need a refill on your cardiac medications before your next appointment, please call your pharmacy*   Lab Work: None If you have labs (blood work) drawn today and your tests are completely normal, you will receive your results only by: [place] (if you have MyChart) OR A paper copy in the mail If you have any lab test that is abnormal or we need to change your treatment, we will call you to review the results.   Testing/Procedures: None   Follow-Up: At [facility], you and your health needs are our priority.  As part of our continuing mission to provide you with exceptional heart care, we have created designated Provider Care Teams.  These Care Teams include your primary Cardiologist (physician) and Advanced Practice Providers (APPs -  Physician Assistants and Nurse Practitioners) who all work together to provide you with the care you need, when you need it.  We recommend signing up for the patient portal called "MyChart".  Sign up information is provided on this After Visit Summary.  MyChart is used to connect with patients for Virtual Visits (Telemedicine).  Patients are able to view lab/test results, encounter notes, upcoming appointments, etc.  Non-urgent messages can be sent to your provider as well.   To learn more about what you can do with MyChart, go to [email or web address].    Your next appointment:   6 month(s)  The format for your next appointment:   In Person  Provider:   Carlyle Dolly, MD   Other Instructions You have been referred to [facility]. They will call you with your first appt.

## 2021-09-24 NOTE — Progress Notes (Signed)
Clinical Summary [name] is a 50 y.o.male seen today for follow up for the following medical problems.    1. CAD - prior NSTEMI 08/2013, DES to RCA. - 08/2013 Echo: LVEF 45-50%, hypokinesis inferior wall - not on ACE due to renal function    - can have some SOB, improved with albuterol. Worst with smoking. No significant chest pain.  - compliant with meds   03/2021 presented with chest pain, code STEMI - cath with occluded RPL 2, received PTCA - echo LVEF 55-60%   - compliant with meds - ongoing headaches, he thinks are related to Wayne Spencer.  - mild nonspecific chest pains non exertional  - mild chest pains at time. Occurs with getting upset. No regular exertional symptoms - compliant with meds - headaches have improved off imdur     2. CKD  III - followed by renal - has appt next week   ??seein Wayne Spencer>  - 08/2021 Cr 2.48 - followed by [facility]     3. HTN - compliant with meds - just took meds this afternoon   4. Hyperlipidemia - compliant, LDL was at goal during 03/2021 admission  08/2021 TC 117 TG 256 HDL 23 LDL 53   5. LE edema - takes lasix 40mg  daily, has noticed some increased swelling.   6. OSA - chronic somnolence, +snoring, no witnessed apnea -  Past Medical History:  Diagnosis Date   CAD (coronary artery disease)    a. s/p DES to RCA in 2014 b. 03/2021: s/p STEMI: widely patent distal RCA stent and 100% occlusion of RPL 2 and balloon angioplasty was performed and there was 30% residual stenosis leading into the PL3 and sidebranch remained 100% stenosed. Nonobstructive disease along the LAD and LCx   Cellulitis    Chronic headache    Hyperlipidemia    Hyperlipidemia associated with type 2 diabetes mellitus (HCC)    Hypertension    Nephrotic syndrome    Renal insufficiency    Tobacco abuse    Umbilical hernia      Allergies  Allergen Reactions   Mobic [Meloxicam]     Unknown    Penicillins Hives   Tramadol     REACTION:  itching, breaking out.     Current Outpatient Medications  Medication Sig Dispense Refill   acetaminophen (TYLENOL) 500 MG tablet Take 1,000 mg by mouth every 6 (six) hours as needed for headache.     albuterol (PROVENTIL HFA;VENTOLIN HFA) 108 (90 Base) MCG/ACT inhaler Inhale 1-2 puffs into the lungs every 6 (six) hours as needed for wheezing or shortness of breath.     ALPRAZolam (XANAX) 1 MG tablet Take 1 mg by mouth 3 (three) times daily as needed for anxiety.     amLODipine (NORVASC) 10 MG tablet Take 10 mg by mouth daily.     aspirin EC 81 MG EC tablet Take 1 tablet (81 mg total) by mouth daily.     cyclobenzaprine (FLEXERIL) 10 MG tablet Take 10 mg by mouth 3 (three) times daily as needed for muscle spasms.     ezetimibe (ZETIA) 10 MG tablet Take 10 mg by mouth daily.     furosemide (LASIX) 40 MG tablet TAKE 1 1/2 TABLET BY MOUTH DAILY. 135 tablet 0   hydrALAZINE (APRESOLINE) 50 MG tablet TAKE (1) TABLET BY MOUTH (3) TIMES DAILY. 90 tablet 0   HYDROcodone-acetaminophen (NORCO) 7.5-325 MG per tablet Take 1 tablet by mouth every 4 (four) hours as needed for pain.  losartan (COZAAR) 50 MG tablet Take 50 mg by mouth daily.     metoprolol succinate (TOPROL-XL) 100 MG 24 hr tablet TAKE 1 TABLET BY MOUTH TWICE DAILY (MORNING AND BEDTIME). TAKE WITH OR IMMEDIATELY AFTER A MEAL. 180 tablet 0   nitroGLYCERIN (NITROSTAT) 0.4 MG SL tablet Place 1 tablet (0.4 mg total) under the tongue every 5 (five) minutes as needed for chest pain. 25 tablet 3   pantoprazole (PROTONIX) 40 MG tablet Take 40 mg by mouth daily.     potassium chloride SA (KLOR-CON) 20 MEQ tablet TAKE (1) TABLET BY MOUTH TWICE DAILY. (Patient taking differently: Take 20 mEq by mouth 2 (two) times daily.) 180 tablet 3   rosuvastatin (CRESTOR) 40 MG tablet Take 40 mg by mouth daily.     ticagrelor (BRILINTA) 90 MG TABS tablet Take 1 tablet (90 mg total) by mouth 2 (two) times daily. 60 tablet 11   No current facility-administered  medications for this visit.     Past Surgical History:  Procedure Laterality Date   CORONARY STENT PLACEMENT     CORONARY/GRAFT ACUTE MI REVASCULARIZATION N/A 03/13/2021   Procedure: Coronary/Graft Acute MI Revascularization;  Surgeon: Leonie Man, MD;  Location: [facility];  Service: Cardiovascular;  Laterality: N/A;   LEFT HEART CATH AND CORONARY ANGIOGRAPHY N/A 03/13/2021   Procedure: LEFT HEART CATH AND CORONARY ANGIOGRAPHY;  Surgeon: Leonie Man, MD;  Location: [facility];  Service: Cardiovascular;  Laterality: N/A;   LEFT HEART CATHETERIZATION WITH CORONARY ANGIOGRAM N/A 08/10/2013   Procedure: LEFT HEART CATHETERIZATION WITH CORONARY ANGIOGRAM;  Surgeon: [name], MD;  Location: [facility] C[facility];  Service: Cardiovascular;  Laterality: N/A;   PERCUTANEOUS CORONARY STENT INTERVENTION (PCI-S) N/A 08/11/2013   Procedure: PERCUTANEOUS CORONARY STENT INTERVENTION (PCI-S);  Surgeon: Blane Ohara, MD;  Location: [facility] CATH LAB;  Service: Cardiovascular;  Laterality: N/A;     Allergies  Allergen Reactions   Mobic [Meloxicam]     Unknown    Penicillins Hives   Tramadol     REACTION: itching, breaking out.      Family History  Problem Relation Age of Onset   Heart attack Father        49s   Heart attack Brother        35s, s/p CABG, passed from MI at 18   Heart attack Sister        13s   CAD Mother    Kidney failure Sister    Diabetes Brother    CAD Brother      Social History Wayne Spencer reports that he has been smoking cigarettes. He started smoking about 36 years ago. He has a 3.00 pack-year smoking history. He has never used smokeless tobacco. Wayne Spencer reports that he does not currently use alcohol.   Review of Systems CONSTITUTIONAL: No weight loss, fever, chills, weakness or fatigue.  HEENT: Eyes: No visual loss, blurred vision, double vision or yellow sclerae.No hearing loss, sneezing, congestion, runny nose or sore throat.  SKIN: No  rash or itching.  CARDIOVASCULAR: per hpi RESPIRATORY: No shortness of breath, cough or sputum.  GASTROINTESTINAL: No anorexia, nausea, vomiting or diarrhea. No abdominal pain or blood.  GENITOURINARY: No burning on urination, no polyuria NEUROLOGICAL: No headache, dizziness, syncope, paralysis, ataxia, numbness or tingling in the extremities. No change in bowel or bladder control.  MUSCULOSKELETAL: No muscle, back pain, joint pain or stiffness.  LYMPHATICS: No enlarged nodes. No history of splenectomy.  PSYCHIATRIC: No  history of depression or anxiety.  ENDOCRINOLOGIC: No reports of sweating, cold or heat intolerance. No polyuria or polydipsia.  Marland Kitchen   Physical Examination Today's Vitals   09/24/21 1404  BP: (!) 144/68  Pulse: 69  SpO2: 95%  Weight: (!) 337 lb (152.9 kg)  Height: 6' 3.5" (1.918 m)   Body mass index is 41.57 kg/m.  Gen: resting comfortably, no acute distress HEENT: no scleral icterus, pupils equal round and reactive, no palptable cervical adenopathy,  CV: RRR, no m/r/g no jvd Resp: Clear to auscultation bilaterally GI: abdomen is soft, non-tender, non-distended, normal bowel sounds, no hepatosplenomegaly MSK: extremities are warm, no edema.  Skin: warm, no rash Neuro:  no focal deficits Psych: appropriate affect   Diagnostic Studies 08/11/13 Cath  Hemodynamics: LV pressure: 156/25 Aortic pressure: 153/96 Angiography Left Main: large normal Left anterior Descending: large, mild - moderate irregularlites. The 1st diag is a small - moderate sized vessel with a 90% stenosis in the proximal segment Left Circumflex: moderate - large vessel. Minor luminal irreg. Right Coronary Artery: large and dominant. Occluded before the bifurcation . The distal RCA can be seen filling from collaterals from the left system LV Gram: not performed. The AV was crossed for pressures. Complications: No apparent complications Patient did tolerate procedure well. Contrast used: 36  cc contrast Conclusions:  1. CAD : his RCA is occluded and fills via collaterals. His 1st diag has a tight stenosis but is probably too small to attempt PCI given his tenuous renal status. Lesion Data: Vessel: RCA/distal Percent stenosis (pre): 99 TIMI-flow (pre): 1 Stent: 4.0x38 mm DES Percent stenosis (post): 0 TIMI-flow (post): 3 Conclusions: Successful PCI of the distal RCA Recommendations: ASA/plavix at least 12 months. Integrelin 12 hours.   08/2013 Echo  LVEF 45-50%, inferior hypokinesis, mild LAE   Clinic EKG 01/09/14: [place]      03/2021 cath LV end diastolic pressure is mildly elevated. There is no aortic valve stenosis. ----------------------------------------------------------- RPAV lesion is 60% stenosed with 100% stenosed (culprit lesion) side Maitland Lesiak in 2nd RPL. Balloon angioplasty was performed on the follow-up on the vessel into PL 3 to restore flow) using a BALLOON SAPPHIRE 2.0X12. Post intervention, there is a 30% residual stenosis leading into PL 3. Post intervention, the side Nastashia Gallo remained 100% stenosed Mid RCA to Dist RCA stent is 5% stenosed. Ost RCA lesion is 30% stenosed. ------------------------------------------------------------ Prox LAD lesion is 45% stenosed with 30% stenosed side Sherice Ijames in 1st Diag. Mid LAD lesion is 30% stenosed with 50% stenosed side Aideliz Garmany in 2nd Diag. Ost Cx to Prox Cx lesion is 25% stenosed.   SUMMARY Single-vessel disease with widely patent stent in the distal RCA, culprit lesion is 100% flush occlusion of RPL 2 (unable to engage with wire), 80% stenosis of follow-on RPA V into RPL 3 (successful balloon PTCA only) => very difficult vessel to wire, very tortuous and extremely distal.  Poor visualization due to body habitus.  Therefore chose to do PTCA only and allow for completion of the infarct-related artery closure. Due to the patient's renal insufficiency and very large patulous vessels requiring  large volumes of contrast to  fill and requirement for frequent angiography, chose to stop at this point. Diffuse mild to moderate disease in the LAD with most prominent lesion being 50 % lesion in the ostial D2.       Post cath RECOMMENDATIONS: Overnight in CCU-we will do 3 hours IV Aggrastat to help distal flow. Check 2D echo in the morning; cycle troponins  [place] consult for the morning. Has been loaded with Brilinta, continue aspirin and Brilinta for ACS x1 year.  (Would likely be able to hold after 6 months) Continue aggressive risk factor modification, has multiple medications already listed.  Will adjust as BP and labs tolerate. Expect if he remains comfortable in the next day or so he can probably discharge home after 48 hours.   03/2021 echo 1. Limited study due to poor acoustic windows. Left ventricular ejection  fraction, by estimation, is 55 to 60%. The left ventricle has normal  function. Wall motion difficult to assess due to poor definition of the  endocardial borders. Based on limited  views, there appears to be hypokinesis of the basal inferior and basal  inferolateral walls.The left ventricular internal cavity size was mildly  dilated. Left ventricular diastolic parameters were normal.   2. Right ventricular systolic function is normal. The right ventricular  size is normal.   3. The mitral valve is normal in structure. Trivial mitral valve  regurgitation. No evidence of mitral stenosis.   4. The aortic valve is tricuspid. Aortic valve regurgitation is not  visualized. No aortic stenosis is present.   5. Aortic dilatation noted. 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   6. The inferior vena cava is dilated in size with >50% respiratory  variability, suggesting right atrial pressure of 8 mmHg.     Assessment and Plan  1. CAD -no symptoms, continue current meds         2. HTN - elevated but just took meds before visit, monitor at this time -continue current meds   3.  Hyperlipidemia -at goal, continue current meds           Arnoldo Lenis, M.D.

## 2021-09-25 ENCOUNTER — Telehealth: Payer: Self-pay | Admitting: Pulmonary Disease

## 2021-09-25 NOTE — Telephone Encounter (Signed)
error 

## 2021-10-11 ENCOUNTER — Telehealth: Payer: Self-pay | Admitting: Cardiology

## 2021-10-11 NOTE — Telephone Encounter (Signed)
Pt's wife informed that it was okay to take [employer]. Pt's spouse also informed that she/pt can contact pharmacist to get drug interactions between medications. Pt's wife verbalized understanding and thanks.

## 2021-10-11 NOTE — Telephone Encounter (Signed)
Pts spouse is reaching out wanting to know if pt is able to take the cherry Nyquil.. was prev informed that this was ok but with new meds that the pt is taking she wanted to be sure this was still safe.. please advise

## 2021-11-19 ENCOUNTER — Other Ambulatory Visit (HOSPITAL_COMMUNITY): Payer: Self-pay | Admitting: Nephrology

## 2021-11-19 ENCOUNTER — Other Ambulatory Visit: Payer: Self-pay | Admitting: Nephrology

## 2021-11-19 DIAGNOSIS — N1832 Chronic kidney disease, stage 3b: Secondary | ICD-10-CM

## 2021-11-20 ENCOUNTER — Telehealth: Payer: Self-pay | Admitting: Cardiology

## 2021-11-20 NOTE — Telephone Encounter (Signed)
° °  Primary Cardiologist: Carlyle Dolly, MD  Chart reviewed as part of pre-operative protocol coverage. Given past medical history and time since last visit, based on ACC/AHA guidelines, Wayne Spencer would be at acceptable risk for the planned procedure without further cardiovascular testing.   His Brilinta may be held for 5 days prior to his procedure. Please resume as soon as hemostasis is achieved at the discretion of the surgeon.    I will route this recommendation to the requesting party via Epic fax function and remove from pre-op pool.  Please call with questions.   Jossie Ng. Sharesa Kemp NP-C    11/20/2021, 4:19 PM [facility] [address] Office [phone] Fax [phone]

## 2021-11-20 NOTE — Telephone Encounter (Signed)
More than 6 months since his intervention which was just an angioplasty as opposed to stent, would be fine to hold brillinta 5 days prior and then resume after procedure   Zandra Abts MD

## 2021-11-20 NOTE — Telephone Encounter (Signed)
Preoperative call back team, please look up fax number for [facility].  Thank you for your help.  Jossie Ng. Loreta Blouch NP-C    11/20/2021, 4:23 PM [facility] [address] Office [phone] Fax [phone]

## 2021-11-20 NOTE — Telephone Encounter (Signed)
Fax number to [facility] is [phone].   Do I need to print and fax the clearance letter?

## 2021-11-20 NOTE — Telephone Encounter (Signed)
Wyland D. Requena 50 year old male is requesting preoperative cardiac evaluation for renal biopsy.  He was last seen in the clinic on 09/24/2021.  He was noted to have bilateral lower extremity edema and his furosemide was increased to 60 mg daily.  He denied recent coronary symptoms.  Recommendation to continue dual antiplatelet therapy until 4/23.  He did note headaches which were felt to be related to Imdur.  His PMH includes coronary artery disease, hyperlipidemia, CKD stage III, hypertension, and OSA.   May his Brilinta be held for 5 days prior to surgery?  Thank you for your help.  Please direct your response to [facility] preop will.  Jossie Ng. Corliss Coggeshall NP-C    11/20/2021, 1:36 PM [facility] [address] Office [phone] Fax [phone]

## 2021-11-20 NOTE — Telephone Encounter (Signed)
° °  Pre-operative Risk Assessment    Patient Name: Wayne Spencer  DOB: 1971/06/13 MRN: [id number]      Request for Surgical Clearance    Procedure:  Kidney Biopsy  Date of Surgery:  Clearance 12-13-21                                Surgeon:  Dr Corliss Parish Surgeon's Group or Practice Name:  [facility] Phone number:  [phone] Fax number:     Type of Clearance Requested:   - Pharmacy:  [facility] 5 days prior to surgery   Type of Anesthesia:   not sure   Additional requests/questions:    [name]   11/20/2021, 1:06 PM

## 2021-11-25 ENCOUNTER — Other Ambulatory Visit: Payer: Self-pay | Admitting: Cardiology

## 2021-12-03 ENCOUNTER — Institutional Professional Consult (permissible substitution): Payer: Medicaid Other | Admitting: Pulmonary Disease

## 2021-12-12 ENCOUNTER — Other Ambulatory Visit: Payer: Self-pay | Admitting: Radiology

## 2021-12-12 ENCOUNTER — Other Ambulatory Visit: Payer: Self-pay | Admitting: Student

## 2021-12-12 ENCOUNTER — Other Ambulatory Visit (HOSPITAL_COMMUNITY): Payer: Self-pay | Admitting: Physician Assistant

## 2021-12-13 ENCOUNTER — Encounter (HOSPITAL_COMMUNITY): Payer: Self-pay

## 2021-12-13 ENCOUNTER — Other Ambulatory Visit: Payer: Self-pay

## 2021-12-13 ENCOUNTER — Ambulatory Visit (HOSPITAL_COMMUNITY)
Admission: RE | Admit: 2021-12-13 | Discharge: 2021-12-13 | Disposition: A | Discharge status: Home / Self Care | Payer: Medicaid Other | Source: Ambulatory Visit | Attending: Nephrology | Admitting: Nephrology

## 2021-12-13 DIAGNOSIS — Z6841 Body Mass Index (BMI) 40.0 and over, adult: Secondary | ICD-10-CM | POA: Insufficient documentation

## 2021-12-13 DIAGNOSIS — I2510 Atherosclerotic heart disease of native coronary artery without angina pectoris: Secondary | ICD-10-CM | POA: Diagnosis not present

## 2021-12-13 DIAGNOSIS — N289 Disorder of kidney and ureter, unspecified: Secondary | ICD-10-CM | POA: Diagnosis not present

## 2021-12-13 DIAGNOSIS — Z791 Long term (current) use of non-steroidal anti-inflammatories (NSAID): Secondary | ICD-10-CM | POA: Diagnosis not present

## 2021-12-13 DIAGNOSIS — I129 Hypertensive chronic kidney disease with stage 1 through stage 4 chronic kidney disease, or unspecified chronic kidney disease: Secondary | ICD-10-CM | POA: Insufficient documentation

## 2021-12-13 DIAGNOSIS — I252 Old myocardial infarction: Secondary | ICD-10-CM | POA: Insufficient documentation

## 2021-12-13 DIAGNOSIS — E785 Hyperlipidemia, unspecified: Secondary | ICD-10-CM | POA: Insufficient documentation

## 2021-12-13 DIAGNOSIS — E669 Obesity, unspecified: Secondary | ICD-10-CM | POA: Diagnosis not present

## 2021-12-13 DIAGNOSIS — F17210 Nicotine dependence, cigarettes, uncomplicated: Secondary | ICD-10-CM | POA: Diagnosis not present

## 2021-12-13 DIAGNOSIS — G8929 Other chronic pain: Secondary | ICD-10-CM | POA: Diagnosis not present

## 2021-12-13 DIAGNOSIS — R809 Proteinuria, unspecified: Secondary | ICD-10-CM | POA: Insufficient documentation

## 2021-12-13 DIAGNOSIS — N1832 Chronic kidney disease, stage 3b: Secondary | ICD-10-CM | POA: Insufficient documentation

## 2021-12-13 LAB — PROTIME-INR
INR: 0.9 (ref 0.8–1.2)
Prothrombin Time: 12.2 seconds (ref 11.4–15.2)

## 2021-12-13 LAB — CBC
HCT: 39.2 % (ref 39.0–52.0)
Hemoglobin: 13.5 g/dL (ref 13.0–17.0)
MCH: 33.7 pg (ref 26.0–34.0)
MCHC: 34.4 g/dL (ref 30.0–36.0)
MCV: 97.8 fL (ref 80.0–100.0)
Platelets: 180 10*3/uL (ref 150–400)
RBC: 4.01 MIL/uL — ABNORMAL LOW (ref 4.22–5.81)
RDW: 13 % (ref 11.5–15.5)
WBC: 8.7 10*3/uL (ref 4.0–10.5)
nRBC: 0 % (ref 0.0–0.2)

## 2021-12-13 LAB — GLUCOSE, CAPILLARY: Glucose-Capillary: 100 mg/dL — ABNORMAL HIGH (ref 70–99)

## 2021-12-13 MED ORDER — MIDAZOLAM HCL 2 MG/2ML IJ SOLN
INTRAMUSCULAR | Status: AC
  Filled 2021-12-13: qty 2

## 2021-12-13 MED ORDER — SODIUM CHLORIDE 0.9 % IV SOLN: INTRAVENOUS | Status: DC

## 2021-12-13 MED ORDER — FENTANYL CITRATE (PF) 100 MCG/2ML IJ SOLN
INTRAMUSCULAR | Status: AC | PRN
  Administered 2021-12-13: 50 ug via INTRAVENOUS
  Administered 2021-12-13 (×2): 25 ug via INTRAVENOUS

## 2021-12-13 MED ORDER — LIDOCAINE HCL (PF) 1 % IJ SOLN
INTRAMUSCULAR | Status: AC
  Filled 2021-12-13: qty 30

## 2021-12-13 MED ORDER — FENTANYL CITRATE (PF) 100 MCG/2ML IJ SOLN
INTRAMUSCULAR | Status: AC
  Filled 2021-12-13: qty 2

## 2021-12-13 MED ORDER — MIDAZOLAM HCL 2 MG/2ML IJ SOLN
INTRAMUSCULAR | Status: AC | PRN
  Administered 2021-12-13: .5 mg via INTRAVENOUS
  Administered 2021-12-13: 1 mg via INTRAVENOUS
  Administered 2021-12-13: .5 mg via INTRAVENOUS

## 2021-12-13 MED ORDER — SODIUM CHLORIDE 0.9 % IV SOLN
INTRAVENOUS | Status: AC | PRN
  Administered 2021-12-13: 10 mL/h via INTRAVENOUS

## 2021-12-13 MED ORDER — GELATIN ABSORBABLE 12-7 MM EX MISC
CUTANEOUS | Status: AC
  Filled 2021-12-13: qty 1

## 2021-12-13 NOTE — Progress Notes (Signed)
Interventional Radiology Procedure Note  Procedure: Ultrasound guided renal biopsy  Findings: Please refer to procedural dictation for full description.  Right inferior pole, 16 ga x2, gelfoam needle track embolization.  Complications: None immediate  Estimated Blood Loss: < 5 mL  Recommendations: Strict 3 hour bedrest. Follow up Pathology results.   [name], MD

## 2021-12-13 NOTE — H&P (Signed)
Chief Complaint: Proteinuria and declining GFR. Request is for random renal biopsy  Referring Physician(s): [place] Physician: [name]  Patient Status: [facility] - Out-pt  History of Present Illness: Wayne Spencer is a 51 y.o. male moker. History of HLD, HTN, renal insufficiency. CAD s/p MI, chronic pain with prior NSAID use. Found to have proteinuria and declining GFR. No pertinent imaging. Nephrology is requesting a random renal biopsy for further evaluation of CKD.   Currently without any significant complaints. Patient alert and laying in bed, calm and comfortable. Denies any fevers, headache, chest pain, SOB, cough, abdominal pain, nausea, vomiting or bleeding. Return precautions and treatment recommendations and follow-up discussed with the patient who is agreeable with the plan.    Past Medical History:  Diagnosis Date   CAD (coronary artery disease)    a. s/p DES to RCA in 2014 b. 03/2021: s/p STEMI: widely patent distal RCA stent and 100% occlusion of RPL 2 and balloon angioplasty was performed and there was 30% residual stenosis leading into the PL3 and sidebranch remained 100% stenosed. Nonobstructive disease along the LAD and LCx   Cellulitis    Chronic headache    Hyperlipidemia    Hyperlipidemia associated with type 2 diabetes mellitus (HCC)    Hypertension    Nephrotic syndrome    Renal insufficiency    Tobacco abuse    Umbilical hernia     Past Surgical History:  Procedure Laterality Date   CORONARY STENT PLACEMENT     CORONARY/GRAFT ACUTE MI REVASCULARIZATION N/A 03/13/2021   Procedure: Coronary/Graft Acute MI Revascularization;  Surgeon: Leonie Man, MD;  Location: [facility];  Service: Cardiovascular;  Laterality: N/A;   LEFT HEART CATH AND CORONARY ANGIOGRAPHY N/A 03/13/2021   Procedure: LEFT HEART CATH AND CORONARY ANGIOGRAPHY;  Surgeon: Leonie Man, MD;  Location: [facility];  Service: Cardiovascular;   Laterality: N/A;   LEFT HEART CATHETERIZATION WITH CORONARY ANGIOGRAM N/A 08/10/2013   Procedure: LEFT HEART CATHETERIZATION WITH CORONARY ANGIOGRAM;  Surgeon: [name], MD;  Location: [facility] CATH LAB;  Service: Cardiovascular;  Laterality: N/A;   PERCUTANEOUS CORONARY STENT INTERVENTION (PCI-S) N/A 08/11/2013   Procedure: PERCUTANEOUS CORONARY STENT INTERVENTION (PCI-S);  Surgeon: Blane Ohara, MD;  Location: [facility] CATH LAB;  Service: Cardiovascular;  Laterality: N/A;    Allergies: Mobic [meloxicam], Penicillins, and Tramadol  Medications: Prior to Admission medications   Medication Sig Start Date End Date Taking? Authorizing Provider  acetaminophen (TYLENOL) 500 MG tablet Take 1,000 mg by mouth every 6 (six) hours as needed for headache.   Yes [provider]  albuterol (PROVENTIL HFA;VENTOLIN HFA) 108 (90 Base) MCG/ACT inhaler Inhale 1-2 puffs into the lungs every 6 (six) hours as needed for wheezing or shortness of breath.   Yes [provider]  ALPRAZolam Duanne Moron) 1 MG tablet Take 1 mg by mouth 3 (three) times daily.   Yes [provider]  amLODipine (NORVASC) 10 MG tablet Take 10 mg by mouth daily.   Yes [provider]  aspirin EC 81 MG EC tablet Take 1 tablet (81 mg total) by mouth daily. 08/12/13  Yes Barrett, Evelene Croon, PA-C  cyclobenzaprine (FLEXERIL) 10 MG tablet Take 10 mg by mouth 3 (three) times daily.   Yes [provider]  ezetimibe (ZETIA) 10 MG tablet Take 10 mg by mouth daily.   Yes [provider]  furosemide (LASIX) 40 MG tablet TAKE 1 1/2 TABLET BY MOUTH DAILY. Patient taking  differently: Take 40 mg by mouth daily. 09/19/21  Yes Branch, Alphonse Guild, MD  hydrALAZINE (APRESOLINE) 50 MG tablet TAKE (1) TABLET BY MOUTH (3) TIMES DAILY. 11/26/21  Yes BranchAlphonse Guild, MD  HYDROcodone-acetaminophen (NORCO) 7.5-325 MG per tablet Take 1 tablet by mouth in the morning, at noon, and at bedtime.   Yes [provider]   losartan (COZAAR) 50 MG tablet Take 50 mg by mouth daily.   Yes [provider]  metoprolol succinate (TOPROL-XL) 100 MG 24 hr tablet TAKE 1 TABLET BY MOUTH TWICE DAILY (MORNING AND BEDTIME). TAKE WITH OR IMMEDIATELY AFTER A MEAL. 09/06/21  Yes Branch, Alphonse Guild, MD  nitroGLYCERIN (NITROSTAT) 0.4 MG SL tablet Place 1 tablet (0.4 mg total) under the tongue every 5 (five) minutes as needed for chest pain. 09/18/14  Yes Lendon Colonel, NP  pantoprazole (PROTONIX) 40 MG tablet Take 40 mg by mouth daily. 02/18/21  Yes [provider]  potassium chloride SA (KLOR-CON) 20 MEQ tablet TAKE (1) TABLET BY MOUTH TWICE DAILY. Patient taking differently: Take 20 mEq by mouth 2 (two) times daily. 02/14/21  Yes Strader, [place] M, PA-C  rosuvastatin (CRESTOR) 40 MG tablet Take 40 mg by mouth daily.   Yes [provider]  ticagrelor (BRILINTA) 90 MG TABS tablet Take 1 tablet (90 mg total) by mouth 2 (two) times daily. 04/16/21  Yes Branch, Alphonse Guild, MD     Family History  Problem Relation Age of Onset   Heart attack Father        35s   Heart attack Brother        31s, s/p CABG, passed from MI at 56   Heart attack Sister        45s   CAD Mother    Kidney failure Sister    Diabetes Brother    CAD Brother     Social History   Socioeconomic History   Marital status: Married    Spouse name: Not on file   Number of children: Not on file   Years of education: Not on file   Highest education level: Not on file  Occupational History   Not on file  Tobacco Use   Smoking status: Every Day    Packs/day: 0.50    Years: 6.00    Pack years: 3.00    Types: Cigarettes    Start date: 02/19/1985   Smokeless tobacco: Never  Vaping Use   Vaping Use: Never used  Substance and Sexual Activity   Alcohol use: Not Currently    Alcohol/week: 0.0 standard drinks    Comment: occasional   Drug use: No   Sexual activity: Yes    Partners: Female  Other Topics Concern   Not on file   Social History Narrative   Lives in [place], [place]. Two children. Works as a Therapist, art and helps change tires.    Social Determinants of Health   Financial Resource Strain: Not on file  Food Insecurity: Not on file  Transportation Needs: Not on file  Physical Activity: Not on file  Stress: Not on file  Social Connections: Not on file   Review of Systems: A 12 point ROS discussed and pertinent positives are indicated in the HPI above.  All other systems are negative.  Review of Systems  Constitutional:  Negative for fever.  HENT:  Negative for congestion.   Respiratory:  Negative for cough and shortness of breath.   Cardiovascular:  Negative for chest pain.  Gastrointestinal:  Negative  for abdominal pain.  Neurological:  Negative for headaches.  Psychiatric/Behavioral:  Negative for behavioral problems and confusion.    Vital Signs: BP 109/73    Pulse 89    Temp 97.9 F (36.6 C) (Oral)    Resp 20    Ht 6' 3.5" (1.918 m)    Wt (!) 340 lb (154.2 kg)    SpO2 94%    BMI 41.94 kg/m   Physical Exam Vitals and nursing note reviewed.  Constitutional:      Appearance: He is well-developed. He is obese.  HENT:     Head: Normocephalic.  Cardiovascular:     Rate and Rhythm: Normal rate. Rhythm irregular.     Heart sounds: Normal heart sounds.  Pulmonary:     Effort: Pulmonary effort is normal.  Abdominal:     Hernia: A hernia (umbilical) is present.  Musculoskeletal:        General: Normal range of motion.     Cervical back: Normal range of motion.  Skin:    General: Skin is dry.  Neurological:     Mental Status: He is alert and oriented to person, place, and time.    Imaging: No results found.  Labs:  CBC: Recent Labs    03/13/21 0843 03/14/21 0110 12/13/21 0640  WBC 7.8 13.0* 8.7  HGB 13.4 13.7 13.5  HCT 39.9 40.2 39.2  PLT 178 179 180    COAGS: Recent Labs    03/13/21 0844  INR 0.9  APTT 52*    BMP: Recent Labs    03/13/21 0843 03/14/21 0110  03/15/21 0041  NA 134* 138 136  K 3.4* 3.5 3.6  CL 103 107 106  CO2 23 22 26   GLUCOSE 157* 100* 106*  BUN 18 12 14   CALCIUM 9.4 10.0 9.6  CREATININE 2.09* 1.97* 2.18*  GFRNONAA 38* 41* 36*    LIVER FUNCTION TESTS: Recent Labs    03/13/21 0843  BILITOT 0.6  AST 30  ALT 30  ALKPHOS 60  PROT 6.4*  ALBUMIN 3.3*      Assessment and Plan:  51 y.o. male outpatient. Smoker. History of HLD, HTN, renal insufficiency. CAD s/p MI, chronic pain with prior NSAID use. Found to have proteinuria and declining GFR. No pertinent imaging. Nephrology is requesting a random renal biopsy for further evaluation of CKD.   Patient is on 81 mg of ASA. Last dose taken 1.9.23. All labs and medications are within acceptable parameters. Pertinent allergies include PCN and tramadol. Patient has been NPO since midnight.  Risks and benefits of renal biopsy was discussed with the patient and/or patient's family including, but not limited to bleeding, infection, damage to adjacent structures or low yield requiring additional tests.  All of the questions were answered and there is agreement to proceed.  Consent signed and in chart.   Thank you for this interesting consult.  I greatly enjoyed meeting JOE GEE and look forward to participating in their care.  A copy of this report was sent to the requesting provider on this date.  Electronically Signed: Jacqualine Mau, NP 12/13/2021, 7:08 AM   I spent a total of  30 Minutes   in face to face in clinical consultation, greater than 50% of which was counseling/coordinating care for random renal biopsy

## 2022-01-01 LAB — SURGICAL PATHOLOGY

## 2022-01-10 ENCOUNTER — Encounter (HOSPITAL_COMMUNITY): Payer: Self-pay

## 2022-02-07 ENCOUNTER — Institutional Professional Consult (permissible substitution): Payer: Medicaid Other | Admitting: Pulmonary Disease

## 2022-02-26 ENCOUNTER — Other Ambulatory Visit: Payer: Self-pay | Admitting: Cardiology

## 2022-03-13 ENCOUNTER — Other Ambulatory Visit: Payer: Self-pay | Admitting: Cardiology

## 2022-04-24 ENCOUNTER — Other Ambulatory Visit: Payer: Self-pay | Admitting: Student

## 2022-04-30 ENCOUNTER — Encounter: Payer: Self-pay | Admitting: *Deleted

## 2022-04-30 ENCOUNTER — Telehealth: Payer: Self-pay | Admitting: Cardiology

## 2022-04-30 MED ORDER — POTASSIUM CHLORIDE CRYS ER 20 MEQ PO TBCR: EXTENDED_RELEASE_TABLET | ORAL | 2 refills | Status: DC

## 2022-04-30 NOTE — Telephone Encounter (Signed)
Pt was overdue for apt,per protocol, 1 month supply given. He has since made f/u apt   New rx to [facility] sent

## 2022-04-30 NOTE — Telephone Encounter (Signed)
*  STAT* If patient is at the pharmacy, call can be transferred to refill team.   1. Which medications need to be refilled? (please list name of each medication and dose if known) potassium chloride SA (KLOR-CON M) 20 MEQ tablet  2. Which pharmacy/location (including street and city if local pharmacy) is medication to be sent to? [facility]  3. Do they need a 30 day or 90 day supply? 2 90 supply    Pt's wife called to say they usually have 2x 90 tab fill, but was only given 60 tab fill this time and called to request more. She also went ahead and schedule pt for an appt with Dr Harl Bowie 10/22/22

## 2022-05-26 ENCOUNTER — Other Ambulatory Visit: Payer: Self-pay | Admitting: Cardiology

## 2022-06-14 ENCOUNTER — Other Ambulatory Visit: Payer: Self-pay | Admitting: Cardiology

## 2022-07-04 ENCOUNTER — Other Ambulatory Visit: Payer: Self-pay | Admitting: Cardiology

## 2022-09-01 ENCOUNTER — Other Ambulatory Visit: Payer: Self-pay | Admitting: Cardiology

## 2022-09-30 ENCOUNTER — Other Ambulatory Visit: Payer: Self-pay | Admitting: Cardiology

## 2022-10-06 ENCOUNTER — Other Ambulatory Visit: Payer: Self-pay | Admitting: Cardiology

## 2022-10-06 ENCOUNTER — Encounter: Payer: Self-pay | Admitting: *Deleted

## 2022-10-22 ENCOUNTER — Encounter: Payer: Self-pay | Admitting: Cardiology

## 2022-10-22 ENCOUNTER — Ambulatory Visit: Payer: Medicaid Other | Attending: Cardiology | Admitting: Cardiology

## 2022-10-22 VITALS — BP 122/80 | HR 88 | Ht 75.5 in | Wt 344.0 lb

## 2022-10-22 DIAGNOSIS — E782 Mixed hyperlipidemia: Secondary | ICD-10-CM

## 2022-10-22 DIAGNOSIS — I2510 Atherosclerotic heart disease of native coronary artery without angina pectoris: Secondary | ICD-10-CM | POA: Diagnosis not present

## 2022-10-22 DIAGNOSIS — I10 Essential (primary) hypertension: Secondary | ICD-10-CM

## 2022-10-22 NOTE — Patient Instructions (Signed)
Medication Instructions:  Your physician has recommended you make the following change in your medication:  -Stop Brilinta   Labwork: None  Testing/Procedures: None  Follow-Up: Follow up with Dr. Harl Bowie in 6 months.   Any Other Special Instructions Will Be Listed Below (If Applicable).     If you need a refill on your cardiac medications before your next appointment, please call your pharmacy.

## 2022-10-22 NOTE — Progress Notes (Signed)
Clinical Summary [name] is a 51 y.o.male seen today for follow up for the following medical problems.    1. CAD - prior NSTEMI 08/2013, DES to RCA. - 08/2013 Echo: LVEF 45-50%, hypokinesis inferior wall - not on ACE due to renal function    - can have some SOB, improved with albuterol. Worst with smoking. No significant chest pain.  - compliant with meds   03/2021 presented with chest pain, code STEMI - cath with occluded RPL 2, received PTCA - echo LVEF 55-60%  - headaches have improved off imdur    - no specific chest pains - compliant with meds. Remains on brillinta, have not seen him since 08/2021. -    2. CKD  III - followed by [facility]   - 08/2021 Cr 2.48      3. HTN -reports neprhology stopped hydralazine.    4. Hyperlipidemia - compliant, LDL was at goal during 03/2021 admission   08/2021 TC 117 TG 256 HDL 23 LDL 53 - 03/2022 TC 121 TG 231 HDL 27 LDL 57   5. LE edema - takes lasix 40mg  daily,swelling overall controled   6. OSA - chronic somnolence, +snoring, no witnessed apnea - has not been interested in sleep study.   Married 18 years, just separated from wife (Wayne Spencer).     Past Medical History:  Diagnosis Date   CAD (coronary artery disease)    a. s/p DES to RCA in 2014 b. 03/2021: s/p STEMI: widely patent distal RCA stent and 100% occlusion of RPL 2 and balloon angioplasty was performed and there was 30% residual stenosis leading into the PL3 and sidebranch remained 100% stenosed. Nonobstructive disease along the LAD and LCx   Cellulitis    Chronic headache    Hyperlipidemia    Hyperlipidemia associated with type 2 diabetes mellitus (HCC)    Hypertension    Nephrotic syndrome    Renal insufficiency    Tobacco abuse    Umbilical hernia      Allergies  Allergen Reactions   Mobic [Meloxicam]     Unknown    Penicillins Hives   Tramadol     REACTION: itching, breaking out.     Current Outpatient Medications   Medication Sig Dispense Refill   acetaminophen (TYLENOL) 500 MG tablet Take 1,000 mg by mouth every 6 (six) hours as needed for headache.     albuterol (PROVENTIL HFA;VENTOLIN HFA) 108 (90 Base) MCG/ACT inhaler Inhale 1-2 puffs into the lungs every 6 (six) hours as needed for wheezing or shortness of breath.     ALPRAZolam (XANAX) 1 MG tablet Take 1 mg by mouth 3 (three) times daily.     amLODipine (NORVASC) 10 MG tablet Take 10 mg by mouth daily.     aspirin EC 81 MG EC tablet Take 1 tablet (81 mg total) by mouth daily.     cyclobenzaprine (FLEXERIL) 10 MG tablet Take 10 mg by mouth 3 (three) times daily.     ezetimibe (ZETIA) 10 MG tablet Take 10 mg by mouth daily.     furosemide (LASIX) 40 MG tablet TAKE 1 1/2 TABLET BY MOUTH DAILY. 135 tablet 0   hydrALAZINE (APRESOLINE) 50 MG tablet TAKE (1) TABLET BY MOUTH (3) TIMES DAILY. 90 tablet 1   HYDROcodone-acetaminophen (NORCO) 7.5-325 MG per tablet Take 1 tablet by mouth in the morning, at noon, and at bedtime.     losartan (COZAAR) 50 MG tablet Take 50 mg by mouth daily.  metoprolol succinate (TOPROL-XL) 100 MG 24 hr tablet TAKE 1 TABLET BY MOUTH TWICE DAILY (MORNING AND BEDTIME). TAKE WITH OR IMMEDIATELY AFTER A MEAL. 180 tablet 1   nitroGLYCERIN (NITROSTAT) 0.4 MG SL tablet Place 1 tablet (0.4 mg total) under the tongue every 5 (five) minutes as needed for chest pain. 25 tablet 3   pantoprazole (PROTONIX) 40 MG tablet Take 40 mg by mouth daily.     potassium chloride SA (KLOR-CON M) 20 MEQ tablet TAKE (1) TABLET BY MOUTH TWICE DAILY. 60 tablet 2   rosuvastatin (CRESTOR) 40 MG tablet Take 40 mg by mouth daily.     ticagrelor (BRILINTA) 90 MG TABS tablet Take 1 tablet (90 mg total) by mouth 2 (two) times daily. 60 tablet 11   No current facility-administered medications for this visit.     Past Surgical History:  Procedure Laterality Date   CORONARY STENT PLACEMENT     CORONARY/GRAFT ACUTE MI REVASCULARIZATION N/A 03/13/2021    Procedure: Coronary/Graft Acute MI Revascularization;  Surgeon: Leonie Man, MD;  Location: [facility];  Service: Cardiovascular;  Laterality: N/A;   LEFT HEART CATH AND CORONARY ANGIOGRAPHY N/A 03/13/2021   Procedure: LEFT HEART CATH AND CORONARY ANGIOGRAPHY;  Surgeon: Leonie Man, MD;  Location: [facility];  Service: Cardiovascular;  Laterality: N/A;   LEFT HEART CATHETERIZATION WITH CORONARY ANGIOGRAM N/A 08/10/2013   Procedure: LEFT HEART CATHETERIZATION WITH CORONARY ANGIOGRAM;  Surgeon: [name], MD;  Location: [facility] CATH LAB;  Service: Cardiovascular;  Laterality: N/A;   PERCUTANEOUS CORONARY STENT INTERVENTION (PCI-S) N/A 08/11/2013   Procedure: PERCUTANEOUS CORONARY STENT INTERVENTION (PCI-S);  Surgeon: Blane Ohara, MD;  Location: [facility] CATH LAB;  Service: Cardiovascular;  Laterality: N/A;     Allergies  Allergen Reactions   Mobic [Meloxicam]     Unknown    Penicillins Hives   Tramadol     REACTION: itching, breaking out.      Family History  Problem Relation Age of Onset   Heart attack Father        34s   Heart attack Brother        65s, s/p CABG, passed from MI at 26   Heart attack Sister        13s   CAD Mother    Kidney failure Sister    Diabetes Brother    CAD Brother      Social History [name] reports that he has been smoking cigarettes. He started smoking about 37 years ago. He has a 3.00 pack-year smoking history. He has never used smokeless tobacco. [name] reports that he does not currently use alcohol.   Review of Systems CONSTITUTIONAL: No weight loss, fever, chills, weakness or fatigue.  HEENT: Eyes: No visual loss, blurred vision, double vision or yellow sclerae.No hearing loss, sneezing, congestion, runny nose or sore throat.  SKIN: No rash or itching.  CARDIOVASCULAR: pe rhpi RESPIRATORY: No shortness of breath, cough or sputum.  GASTROINTESTINAL: No anorexia, nausea, vomiting or diarrhea. No abdominal pain or  blood.  GENITOURINARY: No burning on urination, no polyuria NEUROLOGICAL: No headache, dizziness, syncope, paralysis, ataxia, numbness or tingling in the extremities. No change in bowel or bladder control.  MUSCULOSKELETAL: No muscle, back pain, joint pain or stiffness.  LYMPHATICS: No enlarged nodes. No history of splenectomy.  PSYCHIATRIC: No history of depression or anxiety.  ENDOCRINOLOGIC: No reports of sweating, cold or heat intolerance. No polyuria or polydipsia.  Marland Kitchen   Physical Examination Today's  Vitals   10/22/22 1441  BP: 122/80  Pulse: 88  Weight: (!) 344 lb (156 kg)  Height: 6' 3.5" (1.918 m)   Body mass index is 42.43 kg/m.  Gen: resting comfortably, no acute distress HEENT: no scleral icterus, pupils equal round and reactive, no palptable cervical adenopathy,  CV: RRR, no mr/g no jvd Resp: Clear to auscultation bilaterally GI: abdomen is soft, non-tender, non-distended, normal bowel sounds, no hepatosplenomegaly MSK: extremities are warm, no edema.  Skin: warm, no rash Neuro:  no focal deficits Psych: appropriate affect   Diagnostic Studies 08/11/13 Cath  Hemodynamics: LV pressure: 156/25 Aortic pressure: 153/96 Angiography Left Main: large normal Left anterior Descending: large, mild - moderate irregularlites. The 1st diag is a small - moderate sized vessel with a 90% stenosis in the proximal segment Left Circumflex: moderate - large vessel. Minor luminal irreg. Right Coronary Artery: large and dominant. Occluded before the bifurcation . The distal RCA can be seen filling from collaterals from the left system LV Gram: not performed. The AV was crossed for pressures. Complications: No apparent complications Patient did tolerate procedure well. Contrast used: 36 cc contrast Conclusions:  1. CAD : his RCA is occluded and fills via collaterals. His 1st diag has a tight stenosis but is probably too small to attempt PCI given his tenuous renal status. Lesion  Data: Vessel: RCA/distal Percent stenosis (pre): 99 TIMI-flow (pre): 1 Stent: 4.0x38 mm DES Percent stenosis (post): 0 TIMI-flow (post): 3 Conclusions: Successful PCI of the distal RCA Recommendations: ASA/plavix at least 12 months. Integrelin 12 hours.   08/2013 Echo  LVEF 45-50%, inferior hypokinesis, mild LAE   Clinic EKG 01/09/14: [place]      03/2021 cath LV end diastolic pressure is mildly elevated. There is no aortic valve stenosis. ----------------------------------------------------------- RPAV lesion is 60% stenosed with 100% stenosed (culprit lesion) side Ajahni Nay in 2nd RPL. Balloon angioplasty was performed on the follow-up on the vessel into PL 3 to restore flow) using a BALLOON SAPPHIRE 2.0X12. Post intervention, there is a 30% residual stenosis leading into PL 3. Post intervention, the side Maxamilian Amadon remained 100% stenosed Mid RCA to Dist RCA stent is 5% stenosed. Ost RCA lesion is 30% stenosed. ------------------------------------------------------------ Prox LAD lesion is 45% stenosed with 30% stenosed side Chrishawn Boley in 1st Diag. Mid LAD lesion is 30% stenosed with 50% stenosed side Ulis Kaps in 2nd Diag. Ost Cx to Prox Cx lesion is 25% stenosed.   SUMMARY Single-vessel disease with widely patent stent in the distal RCA, culprit lesion is 100% flush occlusion of RPL 2 (unable to engage with wire), 80% stenosis of follow-on RPA V into RPL 3 (successful balloon PTCA only) => very difficult vessel to wire, very tortuous and extremely distal.  Poor visualization due to body habitus.  Therefore chose to do PTCA only and allow for completion of the infarct-related artery closure. Due to the patient's renal insufficiency and very large patulous vessels requiring  large volumes of contrast to fill and requirement for frequent angiography, chose to stop at this point. Diffuse mild to moderate disease in the LAD with most prominent lesion being 50 % lesion in the ostial D2.       Post  cath RECOMMENDATIONS: Overnight in CCU-we will do 3 hours IV Aggrastat to help distal flow. Check 2D echo in the morning; cycle troponins [facility] consult for the morning. Has been loaded with Brilinta, continue aspirin and Brilinta for ACS x1 year.  (Would likely be able to hold after 6 months) Continue aggressive  risk factor modification, has multiple medications already listed.  Will adjust as BP and labs tolerate. Expect if he remains comfortable in the next day or so he can probably discharge home after 48 hours.   03/2021 echo 1. Limited study due to poor acoustic windows. Left ventricular ejection  fraction, by estimation, is 55 to 60%. The left ventricle has normal  function. Wall motion difficult to assess due to poor definition of the  endocardial borders. Based on limited  views, there appears to be hypokinesis of the basal inferior and basal  inferolateral walls.The left ventricular internal cavity size was mildly  dilated. Left ventricular diastolic parameters were normal.   2. Right ventricular systolic function is normal. The right ventricular  size is normal.   3. The mitral valve is normal in structure. Trivial mitral valve  regurgitation. No evidence of mitral stenosis.   4. The aortic valve is tricuspid. Aortic valve regurgitation is not  visualized. No aortic stenosis is present.   5. Aortic dilatation noted. 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   6. The inferior vena cava is dilated in size with >50% respiratory  variability, suggesting right atrial pressure of 8 mmHg.         Assessment and Plan   1. CAD -no symptoms - over 1 year since his PTCA, can d/c brillinta - continue other meds         2. HTN - at goal, continue currne tmeds   3. Hyperlipidemia -LDL at goal, continue current meds. Discussed dietary changes to lower TGs     Arnoldo Lenis, M.D.

## 2022-12-16 ENCOUNTER — Other Ambulatory Visit: Payer: Self-pay | Admitting: Cardiology

## 2023-01-06 ENCOUNTER — Encounter: Payer: Self-pay | Admitting: *Deleted

## 2023-01-29 ENCOUNTER — Ambulatory Visit: Payer: Medicaid Other | Admitting: Gastroenterology

## 2023-02-18 NOTE — Progress Notes (Deleted)
GI Office Note    Referring Provider: Celene Squibb, MD Primary Care Physician:  Celene Squibb, MD  Primary Gastroenterologist: ***  Chief Complaint   No chief complaint on file.  History of Present Illness   Wayne Spencer is a 52 y.o. male presenting today at the request of Celene Squibb, MD for office visit prior to scheduling screening colonoscopy.  EKG November 2023: Normal sinus rhythm.  Echocardiogram April 2022 with EF 55-60%, trivial mitral valve regurg, mild aortic root dilation.  Labs 01/05/2023: GFR 26, creatinine 2.3, hemoglobin 14.4.  Normal iron panel.  Ferritin 169  Today: Blood thinner?   Current Outpatient Medications  Medication Sig Dispense Refill   acetaminophen (TYLENOL) 500 MG tablet Take 1,000 mg by mouth every 6 (six) hours as needed for headache.     albuterol (PROVENTIL HFA;VENTOLIN HFA) 108 (90 Base) MCG/ACT inhaler Inhale 1-2 puffs into the lungs every 6 (six) hours as needed for wheezing or shortness of breath.     ALPRAZolam (XANAX) 1 MG tablet Take 1 mg by mouth 3 (three) times daily.     amLODipine (NORVASC) 10 MG tablet Take 10 mg by mouth daily.     aspirin EC 81 MG EC tablet Take 1 tablet (81 mg total) by mouth daily.     cyclobenzaprine (FLEXERIL) 10 MG tablet Take 10 mg by mouth 3 (three) times daily.     ezetimibe (ZETIA) 10 MG tablet Take 10 mg by mouth daily.     furosemide (LASIX) 40 MG tablet TAKE 1 1/2 TABLET BY MOUTH DAILY. 135 tablet 0   HYDROcodone-acetaminophen (NORCO) 7.5-325 MG per tablet Take 1 tablet by mouth in the morning, at noon, and at bedtime.     losartan (COZAAR) 50 MG tablet Take 50 mg by mouth daily.     metoprolol succinate (TOPROL-XL) 100 MG 24 hr tablet TAKE 1 TABLET BY MOUTH TWICE DAILY (MORNING AND BEDTIME). TAKE WITH OR IMMEDIATELY AFTER A MEAL. 180 tablet 0   nitroGLYCERIN (NITROSTAT) 0.4 MG SL tablet Place 1 tablet (0.4 mg total) under the tongue every 5 (five) minutes as needed for chest pain. 25 tablet 3    pantoprazole (PROTONIX) 40 MG tablet Take 40 mg by mouth daily.     potassium chloride SA (KLOR-CON M) 20 MEQ tablet TAKE (1) TABLET BY MOUTH TWICE DAILY. 60 tablet 2   rosuvastatin (CRESTOR) 40 MG tablet Take 40 mg by mouth daily.     No current facility-administered medications for this visit.    Past Medical History:  Diagnosis Date   CAD (coronary artery disease)    a. s/p DES to RCA in 2014 b. 03/2021: s/p STEMI: widely patent distal RCA stent and 100% occlusion of RPL 2 and balloon angioplasty was performed and there was 30% residual stenosis leading into the PL3 and sidebranch remained 100% stenosed. Nonobstructive disease along the LAD and LCx   Cellulitis    Chronic headache    Hyperlipidemia    Hyperlipidemia associated with type 2 diabetes mellitus (HCC)    Hypertension    Nephrotic syndrome    Renal insufficiency    Tobacco abuse    Umbilical hernia     Past Surgical History:  Procedure Laterality Date   CORONARY STENT PLACEMENT     CORONARY/GRAFT ACUTE MI REVASCULARIZATION N/A 03/13/2021   Procedure: Coronary/Graft Acute MI Revascularization;  Surgeon: Leonie Man, MD;  Location: [facility];  Service: Cardiovascular;  Laterality: N/A;  LEFT HEART CATH AND CORONARY ANGIOGRAPHY N/A 03/13/2021   Procedure: LEFT HEART CATH AND CORONARY ANGIOGRAPHY;  Surgeon: Leonie Man, MD;  Location: [facility];  Service: Cardiovascular;  Laterality: N/A;   LEFT HEART CATHETERIZATION WITH CORONARY ANGIOGRAM N/A 08/10/2013   Procedure: LEFT HEART CATHETERIZATION WITH CORONARY ANGIOGRAM;  Surgeon: [name], MD;  Location: [facility] CATH LAB;  Service: Cardiovascular;  Laterality: N/A;   PERCUTANEOUS CORONARY STENT INTERVENTION (PCI-S) N/A 08/11/2013   Procedure: PERCUTANEOUS CORONARY STENT INTERVENTION (PCI-S);  Surgeon: Blane Ohara, MD;  Location: [facility] CATH LAB;  Service: Cardiovascular;  Laterality: N/A;    Family History  Problem Relation Age of Onset   Heart  attack Father        67s   Heart attack Brother        41s, s/p CABG, passed from MI at 62   Heart attack Sister        74s   CAD Mother    Kidney failure Sister    Diabetes Brother    CAD Brother     Allergies as of 02/19/2023 - Review Complete 10/22/2022  Allergen Reaction Noted   Mobic [meloxicam]  08/09/2013   Penicillins Hives 09/12/2020   Tramadol      Social History   Socioeconomic History   Marital status: Married    Spouse name: Not on file   Number of children: Not on file   Years of education: Not on file   Highest education level: Not on file  Occupational History   Not on file  Tobacco Use   Smoking status: Every Day    Packs/day: 0.50    Years: 6.00    Additional pack years: 0.00    Total pack years: 3.00    Types: Cigarettes    Start date: 02/19/1985   Smokeless tobacco: Never  Vaping Use   Vaping Use: Never used  Substance and Sexual Activity   Alcohol use: Not Currently    Alcohol/week: 0.0 standard drinks of alcohol    Comment: occasional   Drug use: No   Sexual activity: Yes    Partners: Female  Other Topics Concern   Not on file  Social History Narrative   Lives in [place], [place]. Two children. Works as a Therapist, art and helps change tires.    Social Determinants of Health   Financial Resource Strain: Not on file  Food Insecurity: Not on file  Transportation Needs: Not on file  Physical Activity: Not on file  Stress: Not on file  Social Connections: Not on file  Intimate Partner Violence: Not on file     Review of Systems  *** Gen: Denies any fever, chills, fatigue, weight loss, lack of appetite.  CV: Denies chest pain, heart palpitations, peripheral edema, syncope.  Resp: Denies shortness of breath at rest or with exertion. Denies wheezing or cough.  GI: see HPI GU : Denies urinary burning, urinary frequency, urinary hesitancy MS: Denies joint pain, muscle weakness, cramps, or limitation of movement.  Derm: Denies rash,  itching, dry skin Psych: Denies depression, anxiety, memory loss, and confusion Heme: Denies bruising, bleeding, and enlarged lymph nodes.   Physical Exam   There were no vitals taken for this visit.  General:   Alert and oriented. Pleasant and cooperative. Well-nourished and well-developed.  Head:  Normocephalic and atraumatic. Eyes:  Without icterus, sclera clear and conjunctiva pink.  Ears:  Normal auditory acuity. Mouth:  No deformity or lesions, oral mucosa pink.  Lungs:  Clear to auscultation bilaterally. No wheezes, rales, or rhonchi. No distress.  Heart:  S1, S2 present without murmurs appreciated.  Abdomen:  +BS, soft, non-tender and non-distended. No HSM noted. No guarding or rebound. No masses appreciated.  Rectal:  Deferred *** Msk:  Symmetrical without gross deformities. Normal posture. Extremities:  Without edema. Neurologic:  Alert and  oriented x4;  grossly normal neurologically. Skin:  Intact without significant lesions or rashes. Psych:  Alert and cooperative. Normal mood and affect.  Assessment   Wayne Spencer is a 52 y.o. male with a history of CAD s/p STEMI and DES to RCA in 2014, HTN, HLD, type 2 diabetes, CKD stage III presenting today for evaluation prior to scheduling screening colonoscopy  Screening for colon cancer:    PLAN   *** Proceed with colonoscopy with propofol by Dr. Marland Kitchen in near future: the risks, benefits, and alternatives have been discussed with the patient in detail. The patient states understanding and desires to proceed.  ASA 3 TriLyte prep    Venetia Night, MSN, FNP-BC, AGACNP-BC [facility]

## 2023-02-19 ENCOUNTER — Ambulatory Visit: Payer: Medicaid Other | Admitting: Gastroenterology

## 2023-03-23 ENCOUNTER — Other Ambulatory Visit: Payer: Self-pay | Admitting: Cardiology

## 2023-04-01 ENCOUNTER — Encounter: Payer: Self-pay | Admitting: Internal Medicine

## 2023-04-10 ENCOUNTER — Ambulatory Visit: Payer: Medicaid Other | Admitting: Cardiology

## 2023-04-10 NOTE — Progress Notes (Deleted)
Clinical Summary Mr. Golberg is a 52 y.o.male  seen today for follow up for the following medical problems.    1. CAD - prior NSTEMI 08/2013, DES to RCA. - 08/2013 Echo: LVEF 45-50%, hypokinesis inferior wall - not on ACE due to renal function    - can have some SOB, improved with albuterol. Worst with smoking. No significant chest pain.  - compliant with meds   03/2021 presented with chest pain, code STEMI - cath with occluded RPL 2, received PTCA - echo LVEF 55-60%   - headaches have improved off imdur     - no specific chest pains - compliant with meds. Remains on brillinta, have not seen him since 08/2021. -    2. CKD  III - followed by [facility]   - 08/2021 Cr 2.48       3. HTN -reports neprhology stopped hydralazine.    4. Hyperlipidemia - compliant, LDL was at goal during 03/2021 admission   08/2021 TC 117 TG 256 HDL 23 LDL 53 - 03/2022 TC 409 TG 811 HDL 27 LDL 57   5. LE edema - takes lasix 40mg  daily,swelling overall controled   6. OSA - chronic somnolence, +snoring, no witnessed apnea - has not been interested in sleep study.    Married 18 years, just separated from wife (colette Tooker).      Past Medical History:  Diagnosis Date   CAD (coronary artery disease)    a. s/p DES to RCA in 2014 b. 03/2021: s/p STEMI: widely patent distal RCA stent and 100% occlusion of RPL 2 and balloon angioplasty was performed and there was 30% residual stenosis leading into the PL3 and sidebranch remained 100% stenosed. Nonobstructive disease along the LAD and LCx   Cellulitis    Chronic headache    Hyperlipidemia    Hyperlipidemia associated with type 2 diabetes mellitus (HCC)    Hypertension    Nephrotic syndrome    Renal insufficiency    Tobacco abuse    Umbilical hernia      Allergies  Allergen Reactions   Mobic [Meloxicam]     Unknown    Penicillins Hives   Tramadol     REACTION: itching, breaking out.     Current Outpatient Medications   Medication Sig Dispense Refill   acetaminophen (TYLENOL) 500 MG tablet Take 1,000 mg by mouth every 6 (six) hours as needed for headache.     albuterol (PROVENTIL HFA;VENTOLIN HFA) 108 (90 Base) MCG/ACT inhaler Inhale 1-2 puffs into the lungs every 6 (six) hours as needed for wheezing or shortness of breath.     ALPRAZolam (XANAX) 1 MG tablet Take 1 mg by mouth 3 (three) times daily.     amLODipine (NORVASC) 10 MG tablet Take 10 mg by mouth daily.     aspirin EC 81 MG EC tablet Take 1 tablet (81 mg total) by mouth daily.     cyclobenzaprine (FLEXERIL) 10 MG tablet Take 10 mg by mouth 3 (three) times daily.     ezetimibe (ZETIA) 10 MG tablet Take 10 mg by mouth daily.     furosemide (LASIX) 40 MG tablet TAKE 1 1/2 TABLET BY MOUTH DAILY. 135 tablet 0   HYDROcodone-acetaminophen (NORCO) 7.5-325 MG per tablet Take 1 tablet by mouth in the morning, at noon, and at bedtime.     losartan (COZAAR) 50 MG tablet Take 50 mg by mouth daily.     metoprolol succinate (TOPROL-XL) 100 MG 24 hr tablet  TAKE 1 TABLET BY MOUTH TWICE DAILY (MORNING AND BEDTIME). TAKE WITH OR IMMEDIATELY AFTER A MEAL. 180 tablet 1   nitroGLYCERIN (NITROSTAT) 0.4 MG SL tablet Place 1 tablet (0.4 mg total) under the tongue every 5 (five) minutes as needed for chest pain. 25 tablet 3   pantoprazole (PROTONIX) 40 MG tablet Take 40 mg by mouth daily.     potassium chloride SA (KLOR-CON M) 20 MEQ tablet TAKE (1) TABLET BY MOUTH TWICE DAILY. 60 tablet 2   rosuvastatin (CRESTOR) 40 MG tablet Take 40 mg by mouth daily.     No current facility-administered medications for this visit.     Past Surgical History:  Procedure Laterality Date   CORONARY STENT PLACEMENT     CORONARY/GRAFT ACUTE MI REVASCULARIZATION N/A 03/13/2021   Procedure: Coronary/Graft Acute MI Revascularization;  Surgeon: Marykay Lex, MD;  Location: [facility] INVASIVE [facility] LAB;  Service: Cardiovascular;  Laterality: N/A;   LEFT HEART CATH AND CORONARY ANGIOGRAPHY N/A  03/13/2021   Procedure: LEFT HEART CATH AND CORONARY ANGIOGRAPHY;  Surgeon: Marykay Lex, MD;  Location: [facility] INVASIVE CV LAB;  Service: Cardiovascular;  Laterality: N/A;   LEFT HEART CATHETERIZATION WITH CORONARY ANGIOGRAM N/A 08/10/2013   Procedure: LEFT HEART CATHETERIZATION WITH CORONARY ANGIOGRAM;  Surgeon: Vesta Mixer, MD;  Location: [facility] CATH LAB;  Service: Cardiovascular;  Laterality: N/A;   PERCUTANEOUS CORONARY STENT INTERVENTION (PCI-S) N/A 08/11/2013   Procedure: PERCUTANEOUS CORONARY STENT INTERVENTION (PCI-S);  Surgeon: Micheline Chapman, MD;  Location: [facility] CATH LAB;  Service: Cardiovascular;  Laterality: N/A;     Allergies  Allergen Reactions   Mobic [Meloxicam]     Unknown    Penicillins Hives   Tramadol     REACTION: itching, breaking out.      Family History  Problem Relation Age of Onset   Heart attack Father        54s   Heart attack Brother        42s, s/p CABG, passed from MI at 66   Heart attack Sister        41s   CAD Mother    Kidney failure Sister    Diabetes Brother    CAD Brother      Social History Mr. Perelman reports that he has been smoking cigarettes. He started smoking about 38 years ago. He has a 3.00 pack-year smoking history. He has never used smokeless tobacco. Mr. Townsend reports that he does not currently use alcohol.   Review of Systems CONSTITUTIONAL: No weight loss, fever, chills, weakness or fatigue.  HEENT: Eyes: No visual loss, blurred vision, double vision or yellow sclerae.No hearing loss, sneezing, congestion, runny nose or sore throat.  SKIN: No rash or itching.  CARDIOVASCULAR:  RESPIRATORY: No shortness of breath, cough or sputum.  GASTROINTESTINAL: No anorexia, nausea, vomiting or diarrhea. No abdominal pain or blood.  GENITOURINARY: No burning on urination, no polyuria NEUROLOGICAL: No headache, dizziness, syncope, paralysis, ataxia, numbness or tingling in the extremities. No change in bowel or bladder control.   MUSCULOSKELETAL: No muscle, back pain, joint pain or stiffness.  LYMPHATICS: No enlarged nodes. No history of splenectomy.  PSYCHIATRIC: No history of depression or anxiety.  ENDOCRINOLOGIC: No reports of sweating, cold or heat intolerance. No polyuria or polydipsia.  Marland Kitchen   Physical Examination There were no vitals filed for this visit. There were no vitals filed for this visit.  Gen: resting comfortably, no acute distress HEENT: no scleral icterus, pupils equal round and reactive, no  palptable cervical adenopathy,  CV Resp: Clear to auscultation bilaterally GI: abdomen is soft, non-tender, non-distended, normal bowel sounds, no hepatosplenomegaly MSK: extremities are warm, no edema.  Skin: warm, no rash Neuro:  no focal deficits Psych: appropriate affect   Diagnostic Studies  08/11/13 Cath  Hemodynamics: LV pressure: 156/25 Aortic pressure: 153/96 Angiography Left Main: large normal Left anterior Descending: large, mild - moderate irregularlites. The 1st diag is a small - moderate sized vessel with a 90% stenosis in the proximal segment Left Circumflex: moderate - large vessel. Minor luminal irreg. Right Coronary Artery: large and dominant. Occluded before the bifurcation . The distal RCA can be seen filling from collaterals from the left system LV Gram: not performed. The AV was crossed for pressures. Complications: No apparent complications Patient did tolerate procedure well. Contrast used: 36 cc contrast Conclusions:  1. CAD : his RCA is occluded and fills via collaterals. His 1st diag has a tight stenosis but is probably too small to attempt PCI given his tenuous renal status. Lesion Data: Vessel: RCA/distal Percent stenosis (pre): 99 TIMI-flow (pre): 1 Stent: 4.0x38 mm DES Percent stenosis (post): 0 TIMI-flow (post): 3 Conclusions: Successful PCI of the distal RCA Recommendations: ASA/plavix at least 12 months. Integrelin 12 hours.   08/2013 Echo  LVEF 45-50%,  inferior hypokinesis, mild LAE   Clinic EKG 01/09/14: [place]      03/2021 cath LV end diastolic pressure is mildly elevated. There is no aortic valve stenosis. ----------------------------------------------------------- RPAV lesion is 60% stenosed with 100% stenosed (culprit lesion) side Nechama Escutia in 2nd RPL. Balloon angioplasty was performed on the follow-up on the vessel into PL 3 to restore flow) using a BALLOON SAPPHIRE 2.0X12. Post intervention, there is a 30% residual stenosis leading into PL 3. Post intervention, the side An Lannan remained 100% stenosed Mid RCA to Dist RCA stent is 5% stenosed. Ost RCA lesion is 30% stenosed. ------------------------------------------------------------ Prox LAD lesion is 45% stenosed with 30% stenosed side Shirlena Brinegar in 1st Diag. Mid LAD lesion is 30% stenosed with 50% stenosed side Heena Woodbury in 2nd Diag. Ost Cx to Prox Cx lesion is 25% stenosed.   SUMMARY Single-vessel disease with widely patent stent in the distal RCA, culprit lesion is 100% flush occlusion of RPL 2 (unable to engage with wire), 80% stenosis of follow-on RPA V into RPL 3 (successful balloon PTCA only) => very difficult vessel to wire, very tortuous and extremely distal.  Poor visualization due to body habitus.  Therefore chose to do PTCA only and allow for completion of the infarct-related artery closure. Due to the patient's renal insufficiency and very large patulous vessels requiring  large volumes of contrast to fill and requirement for frequent angiography, chose to stop at this point. Diffuse mild to moderate disease in the LAD with most prominent lesion being 50 % lesion in the ostial D2.       Post cath RECOMMENDATIONS: Overnight in CCU-we will do 3 hours IV Aggrastat to help distal flow. Check 2D echo in the morning; cycle troponins [facility] consult for the morning. Has been loaded with Brilinta, continue aspirin and Brilinta for ACS x1 year.  (Would likely be able to hold after 6  months) Continue aggressive risk factor modification, has multiple medications already listed.  Will adjust as BP and labs tolerate. Expect if he remains comfortable in the next day or so he can probably discharge home after 48 hours.   03/2021 echo 1. Limited study due to poor acoustic windows. Left ventricular ejection  fraction, by  estimation, is 55 to 60%. The left ventricle has normal  function. Wall motion difficult to assess due to poor definition of the  endocardial borders. Based on limited  views, there appears to be hypokinesis of the basal inferior and basal  inferolateral walls.The left ventricular internal cavity size was mildly  dilated. Left ventricular diastolic parameters were normal.   2. Right ventricular systolic function is normal. The right ventricular  size is normal.   3. The mitral valve is normal in structure. Trivial mitral valve  regurgitation. No evidence of mitral stenosis.   4. The aortic valve is tricuspid. Aortic valve regurgitation is not  visualized. No aortic stenosis is present.   5. Aortic dilatation noted. 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   6. The inferior vena cava is dilated in size with >50% respiratory  variability, suggesting right atrial pressure of 8 mmHg.            Assessment and Plan  1. CAD -no symptoms - over 1 year since his PTCA, can d/c brillinta - continue other meds         2. HTN - at goal, continue currne tmeds   3. Hyperlipidemia -LDL at goal, continue current meds. Discussed dietary changes to lower TGs          Antoine Poche, M.D., F.A.C.C.

## 2023-04-22 ENCOUNTER — Encounter: Payer: Self-pay | Admitting: *Deleted

## 2023-05-25 ENCOUNTER — Other Ambulatory Visit: Payer: Self-pay | Admitting: Cardiology

## 2023-07-09 ENCOUNTER — Other Ambulatory Visit (HOSPITAL_COMMUNITY): Payer: Self-pay

## 2023-07-13 ENCOUNTER — Other Ambulatory Visit: Payer: Self-pay | Admitting: Cardiology

## 2023-08-31 ENCOUNTER — Encounter: Payer: Self-pay | Admitting: Cardiology

## 2023-08-31 ENCOUNTER — Ambulatory Visit: Payer: Medicaid Other | Attending: Cardiology | Admitting: Cardiology

## 2023-08-31 VITALS — BP 120/80 | HR 88 | Ht 75.5 in | Wt 349.0 lb

## 2023-08-31 DIAGNOSIS — I10 Essential (primary) hypertension: Secondary | ICD-10-CM | POA: Diagnosis not present

## 2023-08-31 DIAGNOSIS — E782 Mixed hyperlipidemia: Secondary | ICD-10-CM | POA: Diagnosis not present

## 2023-08-31 DIAGNOSIS — I2510 Atherosclerotic heart disease of native coronary artery without angina pectoris: Secondary | ICD-10-CM | POA: Diagnosis not present

## 2023-08-31 MED ORDER — ROSUVASTATIN CALCIUM 40 MG PO TABS: 40.0000 mg | ORAL_TABLET | Freq: Every day | ORAL | 3 refills | Status: DC

## 2023-08-31 NOTE — Patient Instructions (Signed)
Medication Instructions:  Your physician has recommended you make the following change in your medication:   -Stop Atorvastatin -Start Rosuvastatin 40 mg tablet once daily.    *If you need a refill on your cardiac medications before your next appointment, please call your pharmacy*   Lab Work: None If you have labs (blood work) drawn today and your tests are completely normal, you will receive your results only by: MyChart Message (if you have MyChart) OR A paper copy in the mail If you have any lab test that is abnormal or we need to change your treatment, we will call you to review the results.   Testing/Procedures: None   Follow-Up: At [facility], you and your health needs are our priority.  As part of our continuing mission to provide you with exceptional heart care, we have created designated Provider Care Teams.  These Care Teams include your primary Cardiologist (physician) and Advanced Practice Providers (APPs -  Physician Assistants and Nurse Practitioners) who all work together to provide you with the care you need, when you need it.  We recommend signing up for the patient portal called "MyChart".  Sign up information is provided on this After Visit Summary.  MyChart is used to connect with patients for Virtual Visits (Telemedicine).  Patients are able to view lab/test results, encounter notes, upcoming appointments, etc.  Non-urgent messages can be sent to your provider as well.   To learn more about what you can do with MyChart, go to [email or web address].    Your next appointment:   6 month(s)  Provider:   You may see Dina Rich, MD or one of the following Advanced Practice Providers on your designated Care Team:   Randall An, PA-C  Jacolyn Reedy, [place]     Other Instructions

## 2023-08-31 NOTE — Progress Notes (Signed)
Clinical Summary Wayne Spencer is a 52 y.o.male seen today for follow up for the following medical problems.    1. CAD - prior NSTEMI 08/2013, DES to RCA. - 08/2013 Echo: LVEF 45-50%, hypokinesis inferior wall     03/2021 presented with chest pain, code STEMI - cath with occluded RPL 2, received PTCA - 03/2021 echo LVEF 55-60%   - headaches on imdur - no exertional chest pains. No SOB/DOE - compliant with meds      2. CKD  III - followed by [facility]   - 08/2021 Cr 2.48       3. HTN -reports neprhology stopped hydralazine.    4. Hyperlipidemia - compliant, LDL was at goal during 03/2021 admission   08/2021 TC 117 TG 256 HDL 23 LDL 53 - 03/2022 TC 027 TG 253 HDL 27 LDL 57 - 07/2023 TC 664 TG 403 HDL 24 LDL 62   5. LE edema - controlled with lasix   6. OSA - chronic somnolence, +snoring, no witnessed apnea - has not been interested in sleep study.    Married 18 years, just separated from wife (Wayne Spencer).   CrCl cannot be calculated (Patient's most recent lab result is older than the maximum 21 days allowed.).  Past Medical History:  Diagnosis Date   CAD (coronary artery disease)    a. s/p DES to RCA in 2014 b. 03/2021: s/p STEMI: widely patent distal RCA stent and 100% occlusion of RPL 2 and balloon angioplasty was performed and there was 30% residual stenosis leading into the PL3 and sidebranch remained 100% stenosed. Nonobstructive disease along the LAD and LCx   Cellulitis    Chronic headache    Hyperlipidemia    Hyperlipidemia associated with type 2 diabetes mellitus (HCC)    Hypertension    Nephrotic syndrome    Renal insufficiency    Tobacco abuse    Umbilical hernia      Allergies  Allergen Reactions   Mobic [Meloxicam]     Unknown    Penicillins Hives   Tramadol     REACTION: itching, breaking out.     Current Outpatient Medications  Medication Sig Dispense Refill   acetaminophen (TYLENOL) 500 MG tablet Take 1,000 mg by mouth  every 6 (six) hours as needed for headache.     albuterol (PROVENTIL HFA;VENTOLIN HFA) 108 (90 Base) MCG/ACT inhaler Inhale 1-2 puffs into the lungs every 6 (six) hours as needed for wheezing or shortness of breath.     ALPRAZolam (XANAX) 1 MG tablet Take 1 mg by mouth 3 (three) times daily.     amLODipine (NORVASC) 10 MG tablet Take 10 mg by mouth daily.     aspirin EC 81 MG EC tablet Take 1 tablet (81 mg total) by mouth daily.     cyclobenzaprine (FLEXERIL) 10 MG tablet Take 10 mg by mouth 3 (three) times daily.     ezetimibe (ZETIA) 10 MG tablet Take 10 mg by mouth daily.     furosemide (LASIX) 40 MG tablet TAKE 1 1/2 TABLET BY MOUTH DAILY. 135 tablet 1   HYDROcodone-acetaminophen (NORCO) 7.5-325 MG per tablet Take 1 tablet by mouth in the morning, at noon, and at bedtime.     losartan (COZAAR) 50 MG tablet Take 50 mg by mouth daily.     metoprolol succinate (TOPROL-XL) 100 MG 24 hr tablet TAKE 1 TABLET BY MOUTH TWICE DAILY (MORNING AND BEDTIME). TAKE WITH OR IMMEDIATELY AFTER A MEAL. 180 tablet  1   nitroGLYCERIN (NITROSTAT) 0.4 MG SL tablet Place 1 tablet (0.4 mg total) under the tongue every 5 (five) minutes as needed for chest pain. 25 tablet 3   pantoprazole (PROTONIX) 40 MG tablet Take 40 mg by mouth daily.     potassium chloride SA (KLOR-CON M) 20 MEQ tablet TAKE (1) TABLET BY MOUTH TWICE DAILY. 180 tablet 0   rosuvastatin (CRESTOR) 40 MG tablet Take 40 mg by mouth daily.     No current facility-administered medications for this visit.     Past Surgical History:  Procedure Laterality Date   CORONARY STENT PLACEMENT     CORONARY/GRAFT ACUTE MI REVASCULARIZATION N/A 03/13/2021   Procedure: Coronary/Graft Acute MI Revascularization;  Surgeon: Marykay Lex, MD;  Location: [facility] INVASIVE CV LAB;  Service: Cardiovascular;  Laterality: N/A;   LEFT HEART CATH AND CORONARY ANGIOGRAPHY N/A 03/13/2021   Procedure: LEFT HEART CATH AND CORONARY ANGIOGRAPHY;  Surgeon: Marykay Lex, MD;   Location: [facility] INVASIVE CV LAB;  Service: Cardiovascular;  Laterality: N/A;   LEFT HEART CATHETERIZATION WITH CORONARY ANGIOGRAM N/A 08/10/2013   Procedure: LEFT HEART CATHETERIZATION WITH CORONARY ANGIOGRAM;  Surgeon: Vesta Mixer, MD;  Location: [facility] CATH LAB;  Service: Cardiovascular;  Laterality: N/A;   PERCUTANEOUS CORONARY STENT INTERVENTION (PCI-S) N/A 08/11/2013   Procedure: PERCUTANEOUS CORONARY STENT INTERVENTION (PCI-S);  Surgeon: Micheline Chapman, MD;  Location: [facility] CATH LAB;  Service: Cardiovascular;  Laterality: N/A;     Allergies  Allergen Reactions   Mobic [Meloxicam]     Unknown    Penicillins Hives   Tramadol     REACTION: itching, breaking out.      Family History  Problem Relation Age of Onset   Heart attack Father        78s   Heart attack Brother        30s, s/p CABG, passed from MI at 107   Heart attack Sister        65s   CAD Mother    Kidney failure Sister    Diabetes Brother    CAD Brother      Social History Wayne Spencer reports that he has been smoking cigarettes. He started smoking about 38 years ago. He has a 19.3 pack-year smoking history. He has never used smokeless tobacco. Wayne Spencer reports that he does not currently use alcohol.   Review of Systems CONSTITUTIONAL: No weight loss, fever, chills, weakness or fatigue.  HEENT: Eyes: No visual loss, blurred vision, double vision or yellow sclerae.No hearing loss, sneezing, congestion, runny nose or sore throat.  SKIN: No rash or itching.  CARDIOVASCULAR: per hpi RESPIRATORY: No shortness of breath, cough or sputum.  GASTROINTESTINAL: No anorexia, nausea, vomiting or diarrhea. No abdominal pain or blood.  GENITOURINARY: No burning on urination, no polyuria NEUROLOGICAL: No headache, dizziness, syncope, paralysis, ataxia, numbness or tingling in the extremities. No change in bowel or bladder control.  MUSCULOSKELETAL: No muscle, back pain, joint pain or stiffness.  LYMPHATICS: No enlarged nodes.  No history of splenectomy.  PSYCHIATRIC: No history of depression or anxiety.  ENDOCRINOLOGIC: No reports of sweating, cold or heat intolerance. No polyuria or polydipsia.  Marland Kitchen   Physical Examination Today's Vitals   08/31/23 1539  BP: 120/80  Pulse: 88  SpO2: 94%  Weight: (!) 349 lb (158.3 kg)  Height: 6' 3.5" (1.918 m)   Body mass index is 43.05 kg/m.  Gen: resting comfortably, no acute distress HEENT: no scleral icterus, pupils equal round and  reactive, no palptable cervical adenopathy,  CV: RRR, no m/rg no jvd Resp: Clear to auscultation bilaterally GI: abdomen is soft, non-tender, non-distended, normal bowel sounds, no hepatosplenomegaly MSK: extremities are warm, no edema.  Skin: warm, no rash Neuro:  no focal deficits Psych: appropriate affect   Diagnostic Studies 08/11/13 Cath  Hemodynamics: LV pressure: 156/25 Aortic pressure: 153/96 Angiography Left Main: large normal Left anterior Descending: large, mild - moderate irregularlites. The 1st diag is a small - moderate sized vessel with a 90% stenosis in the proximal segment Left Circumflex: moderate - large vessel. Minor luminal irreg. Right Coronary Artery: large and dominant. Occluded before the bifurcation . The distal RCA can be seen filling from collaterals from the left system LV Gram: not performed. The AV was crossed for pressures. Complications: No apparent complications Patient did tolerate procedure well. Contrast used: 36 cc contrast Conclusions:  1. CAD : his RCA is occluded and fills via collaterals. His 1st diag has a tight stenosis but is probably too small to attempt PCI given his tenuous renal status. Lesion Data: Vessel: RCA/distal Percent stenosis (pre): 99 TIMI-flow (pre): 1 Stent: 4.0x38 mm DES Percent stenosis (post): 0 TIMI-flow (post): 3 Conclusions: Successful PCI of the distal RCA Recommendations: ASA/plavix at least 12 months. Integrelin 12 hours.   08/2013 Echo  LVEF 45-50%,  inferior hypokinesis, mild LAE   Clinic EKG 01/09/14: Bigeminy      03/2021 cath LV end diastolic pressure is mildly elevated. There is no aortic valve stenosis. ----------------------------------------------------------- RPAV lesion is 60% stenosed with 100% stenosed (culprit lesion) side Wayne Spencer in 2nd RPL. Balloon angioplasty was performed on the follow-up on the vessel into PL 3 to restore flow) using a BALLOON SAPPHIRE 2.0X12. Post intervention, there is a 30% residual stenosis leading into PL 3. Post intervention, the side Wayne Spencer remained 100% stenosed Mid RCA to Dist RCA stent is 5% stenosed. Ost RCA lesion is 30% stenosed. ------------------------------------------------------------ Prox LAD lesion is 45% stenosed with 30% stenosed side Wayne Spencer in 1st Diag. Mid LAD lesion is 30% stenosed with 50% stenosed side Wayne Spencer in 2nd Diag. Ost Cx to Prox Cx lesion is 25% stenosed.   SUMMARY Single-vessel disease with widely patent stent in the distal RCA, culprit lesion is 100% flush occlusion of RPL 2 (unable to engage with wire), 80% stenosis of follow-on RPA V into RPL 3 (successful balloon PTCA only) => very difficult vessel to wire, very tortuous and extremely distal.  Poor visualization due to body habitus.  Therefore chose to do PTCA only and allow for completion of the infarct-related artery closure. Due to the patient's renal insufficiency and very large patulous vessels requiring  large volumes of contrast to fill and requirement for frequent angiography, chose to stop at this point. Diffuse mild to moderate disease in the LAD with most prominent lesion being 50 % lesion in the ostial D2.       Post cath RECOMMENDATIONS: Overnight in CCU-we will do 3 hours IV Aggrastat to help distal flow. Check 2D echo in the morning; cycle troponins [facility] consult for the morning. Has been loaded with Brilinta, continue aspirin and Brilinta for ACS x1 year.  (Would likely be able to hold after 6  months) Continue aggressive risk factor modification, has multiple medications already listed.  Will adjust as BP and labs tolerate. Expect if he remains comfortable in the next day or so he can probably discharge home after 48 hours.   03/2021 echo 1. Limited study due to poor acoustic windows.  Left ventricular ejection  fraction, by estimation, is 55 to 60%. The left ventricle has normal  function. Wall motion difficult to assess due to poor definition of the  endocardial borders. Based on limited  views, there appears to be hypokinesis of the basal inferior and basal  inferolateral walls.The left ventricular internal cavity size was mildly  dilated. Left ventricular diastolic parameters were normal.   2. Right ventricular systolic function is normal. The right ventricular  size is normal.   3. The mitral valve is normal in structure. Trivial mitral valve  regurgitation. No evidence of mitral stenosis.   4. The aortic valve is tricuspid. Aortic valve regurgitation is not  visualized. No aortic stenosis is present.   5. Aortic dilatation noted. 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   6. The inferior vena cava is dilated in size with >50% respiratory  variability, suggesting right atrial pressure of 8 mmHg.     Assessment and Plan   1. CAD - denies any symptoms, continue current meds     2. HTN -he is at goal, continue current meds   3. Hyperlipidemia -LDL above goal of <55. Appears his crestor was changed to atorva 40mg  at some point, I don't see documentation. Change back to crestor 40mg  daily, continue zetia 10mg  daily -renal dysfunction by CrCl is in the 60s so rosuvastatin high dose not contraindicated.      Antoine Poche, M.D.

## 2023-09-07 ENCOUNTER — Other Ambulatory Visit: Payer: Self-pay | Admitting: Cardiology

## 2023-10-01 ENCOUNTER — Encounter: Payer: Self-pay | Admitting: *Deleted

## 2024-02-05 ENCOUNTER — Other Ambulatory Visit: Payer: Self-pay | Admitting: Cardiology

## 2024-02-17 ENCOUNTER — Encounter: Payer: Self-pay | Admitting: Family Medicine

## 2024-02-22 ENCOUNTER — Other Ambulatory Visit: Payer: Self-pay | Admitting: Cardiology

## 2024-02-23 ENCOUNTER — Encounter: Payer: Self-pay | Admitting: *Deleted

## 2024-03-11 ENCOUNTER — Ambulatory Visit: Payer: Medicaid Other | Attending: Cardiology | Admitting: Cardiology

## 2024-03-11 NOTE — Progress Notes (Deleted)
 Clinical Summary [name] is a 53 y.o.male seen today for follow up for the following medical problems.    1. CAD - prior NSTEMI 08/2013, DES to RCA. - 08/2013 Echo: LVEF 45-50%, hypokinesis inferior wall     03/2021 presented with chest pain, code STEMI - cath with occluded RPL 2, received PTCA - 03/2021 echo LVEF 55-60%   - headaches on imdur - no exertional chest pains. No SOB/DOE - compliant with meds       2. CKD  III - followed by [facility]   - 08/2021 Cr 2.48       3. HTN -reports neprhology stopped hydralazine.    4. Hyperlipidemia - compliant, LDL was at goal during 03/2021 admission   08/2021 TC 117 TG 256 HDL 23 LDL 53 - 03/2022 TC 161 TG 096 HDL 27 LDL 57 - 07/2023 TC 045 TG 409 HDL 24 LDL 62   5. LE edema - controlled with lasix   6. OSA - chronic somnolence, +snoring, no witnessed apnea - has not been interested in sleep study.    Married 18 years, just separated from wife (Wayne Spencer).   Past Medical History:  Diagnosis Date   CAD (coronary artery disease)    a. s/p DES to RCA in 2014 b. 03/2021: s/p STEMI: widely patent distal RCA stent and 100% occlusion of RPL 2 and balloon angioplasty was performed and there was 30% residual stenosis leading into the PL3 and sidebranch remained 100% stenosed. Nonobstructive disease along the LAD and LCx   Cellulitis    Chronic headache    Hyperlipidemia    Hyperlipidemia associated with type 2 diabetes mellitus (HCC)    Hypertension    Nephrotic syndrome    Renal insufficiency    Tobacco abuse    Umbilical hernia      Allergies  Allergen Reactions   Mobic [Meloxicam]     Unknown    Penicillins Hives   Tramadol     REACTION: itching, breaking out.     Current Outpatient Medications  Medication Sig Dispense Refill   acetaminophen (TYLENOL) 500 MG tablet Take 1,000 mg by mouth every 6 (six) hours as needed for headache.     albuterol (PROVENTIL HFA;VENTOLIN HFA) 108 (90 Base)  MCG/ACT inhaler Inhale 1-2 puffs into the lungs every 6 (six) hours as needed for wheezing or shortness of breath.     ALPRAZolam (XANAX) 1 MG tablet Take 1 mg by mouth 3 (three) times daily.     amLODipine (NORVASC) 10 MG tablet Take 10 mg by mouth daily.     aspirin EC 81 MG EC tablet Take 1 tablet (81 mg total) by mouth daily.     cyclobenzaprine (FLEXERIL) 10 MG tablet Take 10 mg by mouth 3 (three) times daily.     ezetimibe (ZETIA) 10 MG tablet Take 10 mg by mouth daily.     furosemide (LASIX) 40 MG tablet TAKE 1 1/2 TABLET BY MOUTH DAILY. 135 tablet 1   HYDROcodone-acetaminophen (NORCO) 7.5-325 MG per tablet Take 1 tablet by mouth in the morning, at noon, and at bedtime.     JARDIANCE 10 MG TABS tablet Take 10 mg by mouth daily.     losartan (COZAAR) 100 MG tablet Take 100 mg by mouth daily.     metoprolol succinate (TOPROL-XL) 100 MG 24 hr tablet TAKE 1 TABLET BY MOUTH TWICE DAILY (MORNING AND BEDTIME). TAKE WITH OR IMMEDIATELY AFTER A MEAL. 180 tablet 1  nitroGLYCERIN (NITROSTAT) 0.4 MG SL tablet Place 0.4 mg under the tongue every 5 (five) minutes x 3 doses as needed for chest pain (if no relief after 2nd dose, proceed to ED or call 911).     pantoprazole (PROTONIX) 40 MG tablet Take 40 mg by mouth daily.     potassium chloride SA (KLOR-CON M) 20 MEQ tablet TAKE (1) TABLET BY MOUTH TWICE DAILY. 180 tablet 2   rosuvastatin (CRESTOR) 40 MG tablet Take 1 tablet (40 mg total) by mouth daily. 90 tablet 3   No current facility-administered medications for this visit.     Past Surgical History:  Procedure Laterality Date   CORONARY STENT PLACEMENT     CORONARY/GRAFT ACUTE MI REVASCULARIZATION N/A 03/13/2021   Procedure: Coronary/Graft Acute MI Revascularization;  Surgeon: Marykay Lex, MD;  Location: [facility] INVASIVE CV LAB;  Service: Cardiovascular;  Laterality: N/A;   LEFT HEART CATH AND CORONARY ANGIOGRAPHY N/A 03/13/2021   Procedure: LEFT HEART CATH AND CORONARY ANGIOGRAPHY;  Surgeon:  Marykay Lex, MD;  Location: [facility] INVASIVE CV LAB;  Service: Cardiovascular;  Laterality: N/A;   LEFT HEART CATHETERIZATION WITH CORONARY ANGIOGRAM N/A 08/10/2013   Procedure: LEFT HEART CATHETERIZATION WITH CORONARY ANGIOGRAM;  Surgeon: Vesta Mixer, MD;  Location: [facility] CATH LAB;  Service: Cardiovascular;  Laterality: N/A;   PERCUTANEOUS CORONARY STENT INTERVENTION (PCI-S) N/A 08/11/2013   Procedure: PERCUTANEOUS CORONARY STENT INTERVENTION (PCI-S);  Surgeon: Micheline Chapman, MD;  Location: [facility] CATH LAB;  Service: Cardiovascular;  Laterality: N/A;     Allergies  Allergen Reactions   Mobic [Meloxicam]     Unknown    Penicillins Hives   Tramadol     REACTION: itching, breaking out.      Family History  Problem Relation Age of Onset   Heart attack Father        64s   Heart attack Brother        60s, s/p CABG, passed from MI at 37   Heart attack Sister        28s   CAD Mother    Kidney failure Sister    Diabetes Brother    CAD Brother      Social History Mr. Hurta reports that he has been smoking cigarettes. He started smoking about 39 years ago. He has a 19.5 pack-year smoking history. He has never used smokeless tobacco. Mr. Colee reports that he does not currently use alcohol.   Review of Systems CONSTITUTIONAL: No weight loss, fever, chills, weakness or fatigue.  HEENT: Eyes: No visual loss, blurred vision, double vision or yellow sclerae.No hearing loss, sneezing, congestion, runny nose or sore throat.  SKIN: No rash or itching.  CARDIOVASCULAR:  RESPIRATORY: No shortness of breath, cough or sputum.  GASTROINTESTINAL: No anorexia, nausea, vomiting or diarrhea. No abdominal pain or blood.  GENITOURINARY: No burning on urination, no polyuria NEUROLOGICAL: No headache, dizziness, syncope, paralysis, ataxia, numbness or tingling in the extremities. No change in bowel or bladder control.  MUSCULOSKELETAL: No muscle, back pain, joint pain or stiffness.  LYMPHATICS: No  enlarged nodes. No history of splenectomy.  PSYCHIATRIC: No history of depression or anxiety.  ENDOCRINOLOGIC: No reports of sweating, cold or heat intolerance. No polyuria or polydipsia.  Marland Kitchen   Physical Examination There were no vitals filed for this visit. There were no vitals filed for this visit.  Gen: resting comfortably, no acute distress HEENT: no scleral icterus, pupils equal round and reactive, no palptable cervical adenopathy,  CV Resp: Clear  to auscultation bilaterally GI: abdomen is soft, non-tender, non-distended, normal bowel sounds, no hepatosplenomegaly MSK: extremities are warm, no edema.  Skin: warm, no rash Neuro:  no focal deficits Psych: appropriate affect   Diagnostic Studies 08/11/13 Cath  Hemodynamics: LV pressure: 156/25 Aortic pressure: 153/96 Angiography Left Main: large normal Left anterior Descending: large, mild - moderate irregularlites. The 1st diag is a small - moderate sized vessel with a 90% stenosis in the proximal segment Left Circumflex: moderate - large vessel. Minor luminal irreg. Right Coronary Artery: large and dominant. Occluded before the bifurcation . The distal RCA can be seen filling from collaterals from the left system LV Gram: not performed. The AV was crossed for pressures. Complications: No apparent complications Patient did tolerate procedure well. Contrast used: 36 cc contrast Conclusions:  1. CAD : his RCA is occluded and fills via collaterals. His 1st diag has a tight stenosis but is probably too small to attempt PCI given his tenuous renal status. Lesion Data: Vessel: RCA/distal Percent stenosis (pre): 99 TIMI-flow (pre): 1 Stent: 4.0x38 mm DES Percent stenosis (post): 0 TIMI-flow (post): 3 Conclusions: Successful PCI of the distal RCA Recommendations: ASA/plavix at least 12 months. Integrelin 12 hours.   08/2013 Echo  LVEF 45-50%, inferior hypokinesis, mild LAE   Clinic EKG 01/09/14: Bigeminy      03/2021 cath LV  end diastolic pressure is mildly elevated. There is no aortic valve stenosis. ----------------------------------------------------------- RPAV lesion is 60% stenosed with 100% stenosed (culprit lesion) side Sheryn Aldaz in 2nd RPL. Balloon angioplasty was performed on the follow-up on the vessel into PL 3 to restore flow) using a BALLOON SAPPHIRE 2.0X12. Post intervention, there is a 30% residual stenosis leading into PL 3. Post intervention, the side Tyce Delcid remained 100% stenosed Mid RCA to Dist RCA stent is 5% stenosed. Ost RCA lesion is 30% stenosed. ------------------------------------------------------------ Prox LAD lesion is 45% stenosed with 30% stenosed side Azelea Seguin in 1st Diag. Mid LAD lesion is 30% stenosed with 50% stenosed side Zaia Carre in 2nd Diag. Ost Cx to Prox Cx lesion is 25% stenosed.   SUMMARY Single-vessel disease with widely patent stent in the distal RCA, culprit lesion is 100% flush occlusion of RPL 2 (unable to engage with wire), 80% stenosis of follow-on RPA V into RPL 3 (successful balloon PTCA only) => very difficult vessel to wire, very tortuous and extremely distal.  Poor visualization due to body habitus.  Therefore chose to do PTCA only and allow for completion of the infarct-related artery closure. Due to the patient's renal insufficiency and very large patulous vessels requiring  large volumes of contrast to fill and requirement for frequent angiography, chose to stop at this point. Diffuse mild to moderate disease in the LAD with most prominent lesion being 50 % lesion in the ostial D2.       Post cath RECOMMENDATIONS: Overnight in CCU-we will do 3 hours IV Aggrastat to help distal flow. Check 2D echo in the morning; cycle troponins [facility] consult for the morning. Has been loaded with Brilinta, continue aspirin and Brilinta for ACS x1 year.  (Would likely be able to hold after 6 months) Continue aggressive risk factor modification, has multiple medications already  listed.  Will adjust as BP and labs tolerate. Expect if he remains comfortable in the next day or so he can probably discharge home after 48 hours.   03/2021 echo 1. Limited study due to poor acoustic windows. Left ventricular ejection  fraction, by estimation, is 55 to 60%. The left ventricle  has normal  function. Wall motion difficult to assess due to poor definition of the  endocardial borders. Based on limited  views, there appears to be hypokinesis of the basal inferior and basal  inferolateral walls.The left ventricular internal cavity size was mildly  dilated. Left ventricular diastolic parameters were normal.   2. Right ventricular systolic function is normal. The right ventricular  size is normal.   3. The mitral valve is normal in structure. Trivial mitral valve  regurgitation. No evidence of mitral stenosis.   4. The aortic valve is tricuspid. Aortic valve regurgitation is not  visualized. No aortic stenosis is present.   5. Aortic dilatation noted. 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   6. The inferior vena cava is dilated in size with >50% respiratory  variability, suggesting right atrial pressure of 8 mmHg.         Assessment and Plan   1. CAD - denies any symptoms, continue current meds     2. HTN -he is at goal, continue current meds   3. Hyperlipidemia -LDL above goal of <55. Appears his crestor was changed to atorva 40mg  at some point, I don't see documentation. Change back to crestor 40mg  daily, continue zetia 10mg  daily -renal dysfunction by CrCl is in the 60s so rosuvastatin high dose not contraindicated.      Antoine Poche, M.D., F.A.C.C.

## 2024-08-04 ENCOUNTER — Encounter: Payer: Self-pay | Admitting: Cardiology

## 2024-08-04 ENCOUNTER — Ambulatory Visit: Attending: Cardiology | Admitting: Cardiology

## 2024-08-04 VITALS — BP 120/70 | HR 90 | Ht 75.5 in | Wt 339.8 lb

## 2024-08-04 DIAGNOSIS — I10 Essential (primary) hypertension: Secondary | ICD-10-CM | POA: Diagnosis not present

## 2024-08-04 DIAGNOSIS — E782 Mixed hyperlipidemia: Secondary | ICD-10-CM | POA: Diagnosis not present

## 2024-08-04 DIAGNOSIS — I2510 Atherosclerotic heart disease of native coronary artery without angina pectoris: Secondary | ICD-10-CM | POA: Diagnosis not present

## 2024-08-04 MED ORDER — ROSUVASTATIN CALCIUM 40 MG PO TABS: 40.0000 mg | ORAL_TABLET | Freq: Every day | ORAL | 3 refills | Status: AC

## 2024-08-04 MED ORDER — EZETIMIBE 10 MG PO TABS: 10.0000 mg | ORAL_TABLET | Freq: Every day | ORAL | 3 refills | Status: AC

## 2024-08-04 MED ORDER — FUROSEMIDE 40 MG PO TABS: ORAL_TABLET | ORAL | 3 refills | Status: DC

## 2024-08-04 NOTE — Patient Instructions (Signed)
 Medication Instructions:   Take Lasix  40 mg daily alternating with 20 mg  Labwork: None today  Testing/Procedures: None today  Follow-Up: 6 months Dr.Branch  Any Other Special Instructions Will Be Listed Below (If Applicable).  If you need a refill on your cardiac medications before your next appointment, please call your pharmacy.

## 2024-08-04 NOTE — Progress Notes (Signed)
 Clinical Summary Wayne Spencer is a 53 y.o.male seen today for follow up for the following medical problems.    1. CAD - prior NSTEMI 08/2013, DES to RCA. - 08/2013 Echo: LVEF 45-50%, hypokinesis inferior wall     03/2021 presented with chest pain, code STEMI - cath with occluded RPL 2, received PTCA - 03/2021 echo LVEF 55-60%  - headaches on imdur    - no chest pains, no SOB/DOE - compliant with meds     2. CKD  IV - followed by [facility]     3. HTN -reports neprhology stopped hydralazine .    4. Hyperlipidemia - compliant, LDL was at goal during 03/2021 admission   08/2021 TC 117 TG 256 HDL 23 LDL 53 - 03/2022 TC 878 TG 768 HDL 27 LDL 57 - 07/2023 TC 868 TG 720 HDL 24 LDL 62 05/2024 TC 128 TG 281 HDL 23 LDL 60   5. LE edema - controlled with lasix  Taking lasix  40mg  daily, reports some intermittent cramps at times.     6. OSA - chronic somnolence, +snoring, no witnessed apnea - has not been interested in sleep study.       Married 18 years, just separated from wife (Wayne Spencer).     Past Medical History:  Diagnosis Date   CAD (coronary artery disease)    a. s/p DES to RCA in 2014 b. 03/2021: s/p STEMI: widely patent distal RCA stent and 100% occlusion of RPL 2 and balloon angioplasty was performed and there was 30% residual stenosis leading into the PL3 and sidebranch remained 100% stenosed. Nonobstructive disease along the LAD and LCx   Cellulitis    Chronic headache    Hyperlipidemia    Hyperlipidemia associated with type 2 diabetes mellitus (HCC)    Hypertension    Nephrotic syndrome    Renal insufficiency    Tobacco abuse    Umbilical hernia      Allergies  Allergen Reactions   Mobic [Meloxicam]     Unknown    Penicillins Hives   Tramadol     REACTION: itching, breaking out.     Current Outpatient Medications  Medication Sig Dispense Refill   acetaminophen  (TYLENOL ) 500 MG tablet Take 1,000 mg by mouth every 6 (six) hours as  needed for headache.     albuterol  (PROVENTIL  HFA;VENTOLIN  HFA) 108 (90 Base) MCG/ACT inhaler Inhale 1-2 puffs into the lungs every 6 (six) hours as needed for wheezing or shortness of breath.     ALPRAZolam  (XANAX ) 1 MG tablet Take 1 mg by mouth 3 (three) times daily.     amLODipine  (NORVASC ) 10 MG tablet Take 10 mg by mouth daily.     aspirin  EC 81 MG EC tablet Take 1 tablet (81 mg total) by mouth daily.     cyclobenzaprine  (FLEXERIL ) 10 MG tablet Take 10 mg by mouth 3 (three) times daily.     ezetimibe  (ZETIA ) 10 MG tablet Take 10 mg by mouth daily.     furosemide  (LASIX ) 40 MG tablet TAKE 1 1/2 TABLET BY MOUTH DAILY. 135 tablet 1   HYDROcodone -acetaminophen  (NORCO) 7.5-325 MG per tablet Take 1 tablet by mouth in the morning, at noon, and at bedtime.     JARDIANCE 10 MG TABS tablet Take 10 mg by mouth daily.     losartan  (COZAAR ) 100 MG tablet Take 100 mg by mouth daily.     metoprolol  succinate (TOPROL -XL) 100 MG 24 hr tablet TAKE 1 TABLET BY MOUTH  TWICE DAILY (MORNING AND BEDTIME). TAKE WITH OR IMMEDIATELY AFTER A MEAL. 180 tablet 1   nitroGLYCERIN  (NITROSTAT ) 0.4 MG SL tablet Place 0.4 mg under the tongue every 5 (five) minutes x 3 doses as needed for chest pain (if no relief after 2nd dose, proceed to ED or call 911).     pantoprazole (PROTONIX) 40 MG tablet Take 40 mg by mouth daily.     potassium chloride  SA (KLOR-CON  M) 20 MEQ tablet TAKE (1) TABLET BY MOUTH TWICE DAILY. 180 tablet 2   rosuvastatin  (CRESTOR ) 40 MG tablet Take 1 tablet (40 mg total) by mouth daily. 90 tablet 3   No current facility-administered medications for this visit.     Past Surgical History:  Procedure Laterality Date   CORONARY STENT PLACEMENT     CORONARY/GRAFT ACUTE MI REVASCULARIZATION N/A 03/13/2021   Procedure: Coronary/Graft Acute MI Revascularization;  Surgeon: Anner Alm ORN, MD;  Location: [facility] INVASIVE CV LAB;  Service: Cardiovascular;  Laterality: N/A;   LEFT HEART CATH AND CORONARY ANGIOGRAPHY  N/A 03/13/2021   Procedure: LEFT HEART CATH AND CORONARY ANGIOGRAPHY;  Surgeon: Anner Alm ORN, MD;  Location: [facility] INVASIVE CV LAB;  Service: Cardiovascular;  Laterality: N/A;   LEFT HEART CATHETERIZATION WITH CORONARY ANGIOGRAM N/A 08/10/2013   Procedure: LEFT HEART CATHETERIZATION WITH CORONARY ANGIOGRAM;  Surgeon: Aleene JINNY Passe, MD;  Location: [facility] CATH LAB;  Service: Cardiovascular;  Laterality: N/A;   PERCUTANEOUS CORONARY STENT INTERVENTION (PCI-S) N/A 08/11/2013   Procedure: PERCUTANEOUS CORONARY STENT INTERVENTION (PCI-S);  Surgeon: Ozell JONETTA Fell, MD;  Location: [facility] CATH LAB;  Service: Cardiovascular;  Laterality: N/A;     Allergies  Allergen Reactions   Mobic [Meloxicam]     Unknown    Penicillins Hives   Tramadol     REACTION: itching, breaking out.      Family History  Problem Relation Age of Onset   Heart attack Father        58s   Heart attack Brother        2s, s/p CABG, passed from MI at 70   Heart attack Sister        26s   CAD Mother    Kidney failure Sister    Diabetes Brother    CAD Brother      Social History [name] reports that he has been smoking cigarettes. He started smoking about 39 years ago. He has a 19.7 pack-year smoking history. He has never used smokeless tobacco. [name] reports that he does not currently use alcohol.    Physical Examination Today's Vitals   08/04/24 1046  BP: 120/70  Pulse: 90  SpO2: 92%  Weight: (!) 339 lb 12.8 oz (154.1 kg)  Height: 6' 3.5 (1.918 m)   Body mass index is 41.91 kg/m.  Gen: resting comfortably, no acute distress HEENT: no scleral icterus, pupils equal round and reactive, no palptable cervical adenopathy,  CV: RRR, no mrg, no jvd Resp: Clear to auscultation bilaterally GI: abdomen is soft, non-tender, non-distended, normal bowel sounds, no hepatosplenomegaly MSK: extremities are warm, no edema.  Skin: warm, no rash Neuro:  no focal deficits Psych: appropriate affect   Diagnostic  Studies  08/11/13 Cath  Hemodynamics: LV pressure: 156/25 Aortic pressure: 153/96 Angiography Left Main: large normal Left anterior Descending: large, mild - moderate irregularlites. The 1st diag is a small - moderate sized vessel with a 90% stenosis in the proximal segment Left Circumflex: moderate - large vessel. Minor luminal irreg. Right Coronary Artery: large  and dominant. Occluded before the bifurcation . The distal RCA can be seen filling from collaterals from the left system LV Gram: not performed. The AV was crossed for pressures. Complications: No apparent complications Patient did tolerate procedure well. Contrast used: 36 cc contrast Conclusions:  1. CAD : his RCA is occluded and fills via collaterals. His 1st diag has a tight stenosis but is probably too small to attempt PCI given his tenuous renal status. Lesion Data: Vessel: RCA/distal Percent stenosis (pre): 99 TIMI-flow (pre): 1 Stent: 4.0x38 mm DES Percent stenosis (post): 0 TIMI-flow (post): 3 Conclusions: Successful PCI of the distal RCA Recommendations: ASA/plavix  at least 12 months. Integrelin 12 hours.   08/2013 Echo  LVEF 45-50%, inferior hypokinesis, mild LAE   Clinic EKG 01/09/14: Bigeminy      03/2021 cath LV end diastolic pressure is mildly elevated. There is no aortic valve stenosis. ----------------------------------------------------------- RPAV lesion is 60% stenosed with 100% stenosed (culprit lesion) side Adom Schoeneck in 2nd RPL. Balloon angioplasty was performed on the follow-up on the vessel into PL 3 to restore flow) using a BALLOON SAPPHIRE 2.0X12. Post intervention, there is a 30% residual stenosis leading into PL 3. Post intervention, the side Brandalyn Harting remained 100% stenosed Mid RCA to Dist RCA stent is 5% stenosed. Ost RCA lesion is 30% stenosed. ------------------------------------------------------------ Prox LAD lesion is 45% stenosed with 30% stenosed side Dontre Laduca in 1st Diag. Mid LAD lesion  is 30% stenosed with 50% stenosed side Latoia Eyster in 2nd Diag. Ost Cx to Prox Cx lesion is 25% stenosed.   SUMMARY Single-vessel disease with widely patent stent in the distal RCA, culprit lesion is 100% flush occlusion of RPL 2 (unable to engage with wire), 80% stenosis of follow-on RPA V into RPL 3 (successful balloon PTCA only) => very difficult vessel to wire, very tortuous and extremely distal.  Poor visualization due to body habitus.  Therefore chose to do PTCA only and allow for completion of the infarct-related artery closure. Due to the patient's renal insufficiency and very large patulous vessels requiring  large volumes of contrast to fill and requirement for frequent angiography, chose to stop at this point. Diffuse mild to moderate disease in the LAD with most prominent lesion being 50 % lesion in the ostial D2.       Post cath RECOMMENDATIONS: Overnight in CCU-we will do 3 hours IV Aggrastat  to help distal flow. Check 2D echo in the morning; cycle troponins [facility] consult for the morning. Has been loaded with Brilinta , continue aspirin  and Brilinta  for ACS x1 year.  (Would likely be able to hold after 6 months) Continue aggressive risk factor modification, has multiple medications already listed.  Will adjust as BP and labs tolerate. Expect if he remains comfortable in the next day or so he can probably discharge home after 48 hours.   03/2021 echo 1. Limited study due to poor acoustic windows. Left ventricular ejection  fraction, by estimation, is 55 to 60%. The left ventricle has normal  function. Wall motion difficult to assess due to poor definition of the  endocardial borders. Based on limited  views, there appears to be hypokinesis of the basal inferior and basal  inferolateral walls.The left ventricular internal cavity size was mildly  dilated. Left ventricular diastolic parameters were normal.   2. Right ventricular systolic function is normal. The right ventricular  size is  normal.   3. The mitral valve is normal in structure. Trivial mitral valve  regurgitation. No evidence of mitral stenosis.   4.  The aortic valve is tricuspid. Aortic valve regurgitation is not  visualized. No aortic stenosis is present.   5. Aortic dilatation noted. 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   6. The inferior vena cava is dilated in size with >50% respiratory  variability, suggesting right atrial pressure of 8 mmHg.        Assessment and Plan   1. CAD - no recent symptoms - EKG shows NSR, no ischemic changes - continue current meds     2. HTN -at goal,c ontinue current meds   3. Hyperlipidemia - CrCl is 50, crestor  at this dose would still be ok - LDL near goal, on maximal oral regimen with zetia  and crestor . Continue at this time.If progression of LDL consider [id number] at that time  F/u 6 months     Dorn PHEBE Ross, M.D.

## 2024-08-14 ENCOUNTER — Other Ambulatory Visit: Payer: Self-pay | Admitting: Cardiology

## 2024-09-21 ENCOUNTER — Encounter (INDEPENDENT_AMBULATORY_CARE_PROVIDER_SITE_OTHER): Payer: Self-pay | Admitting: *Deleted

## 2024-11-18 ENCOUNTER — Other Ambulatory Visit: Payer: Self-pay | Admitting: Cardiology

## 2024-12-14 ENCOUNTER — Other Ambulatory Visit: Payer: Self-pay | Admitting: Cardiology

## 2024-12-14 NOTE — Telephone Encounter (Signed)
 All Labs done on 09/09/24 except for Magnesium. No Magnesium in 2025. Does Dr. Alvan want to refill? Please advise.

## 2025-01-06 ENCOUNTER — Other Ambulatory Visit (HOSPITAL_COMMUNITY): Payer: Self-pay | Admitting: Internal Medicine

## 2025-01-06 ENCOUNTER — Encounter: Payer: Self-pay | Admitting: Internal Medicine

## 2025-01-06 DIAGNOSIS — F17200 Nicotine dependence, unspecified, uncomplicated: Secondary | ICD-10-CM

## 2025-01-06 DIAGNOSIS — F17218 Nicotine dependence, cigarettes, with other nicotine-induced disorders: Secondary | ICD-10-CM

## 2025-02-03 ENCOUNTER — Ambulatory Visit: Admitting: Cardiology
